# Patient Record
Sex: Female | Born: 1960 | Race: White | Hispanic: No | Marital: Married | Smoking: Former smoker
Health system: Southern US, Community
[De-identification: ages and names within clinical notes are randomized; demographics above are authoritative.]

## PROBLEM LIST (undated history)

## (undated) DIAGNOSIS — M8580 Other specified disorders of bone density and structure, unspecified site: Secondary | ICD-10-CM

## (undated) DIAGNOSIS — J45909 Unspecified asthma, uncomplicated: Secondary | ICD-10-CM

## (undated) DIAGNOSIS — U071 COVID-19: Secondary | ICD-10-CM

## (undated) DIAGNOSIS — R7302 Impaired glucose tolerance (oral): Secondary | ICD-10-CM

## (undated) DIAGNOSIS — K219 Gastro-esophageal reflux disease without esophagitis: Secondary | ICD-10-CM

## (undated) DIAGNOSIS — G4730 Sleep apnea, unspecified: Secondary | ICD-10-CM

## (undated) DIAGNOSIS — E669 Obesity, unspecified: Secondary | ICD-10-CM

## (undated) DIAGNOSIS — E785 Hyperlipidemia, unspecified: Secondary | ICD-10-CM

## (undated) DIAGNOSIS — J309 Allergic rhinitis, unspecified: Secondary | ICD-10-CM

## (undated) DIAGNOSIS — R42 Dizziness and giddiness: Secondary | ICD-10-CM

## (undated) DIAGNOSIS — E119 Type 2 diabetes mellitus without complications: Secondary | ICD-10-CM

## (undated) DIAGNOSIS — E559 Vitamin D deficiency, unspecified: Secondary | ICD-10-CM

## (undated) HISTORY — DX: COVID-19: U07.1

## (undated) HISTORY — DX: Other specified disorders of bone density and structure, unspecified site: M85.80

## (undated) HISTORY — DX: Hyperlipidemia, unspecified: E78.5

## (undated) HISTORY — DX: Obesity, unspecified: E66.9

## (undated) HISTORY — DX: Impaired glucose tolerance (oral): R73.02

## (undated) HISTORY — PX: BREAST CYST ASPIRATION: SHX578

## (undated) HISTORY — DX: Unspecified asthma, uncomplicated: J45.909

## (undated) HISTORY — DX: Allergic rhinitis, unspecified: J30.9

## (undated) HISTORY — DX: Gastro-esophageal reflux disease without esophagitis: K21.9

## (undated) HISTORY — DX: Vitamin D deficiency, unspecified: E55.9

## (undated) HISTORY — PX: GALLBLADDER SURGERY: SHX652

## (undated) HISTORY — PX: BREAST BIOPSY: SHX20

## (undated) HISTORY — PX: CHOLECYSTECTOMY: SHX55

---
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
## 1991-02-19 HISTORY — PX: BREAST EXCISIONAL BIOPSY: SUR124

## 1991-02-19 HISTORY — PX: BREAST BIOPSY: SHX20

## 1999-02-19 HISTORY — PX: ABDOMINAL HYSTERECTOMY: SHX81

## 2003-11-29 ENCOUNTER — Ambulatory Visit: Payer: Self-pay | Admitting: General Surgery

## 2003-12-09 ENCOUNTER — Ambulatory Visit: Payer: Self-pay | Admitting: General Surgery

## 2004-03-19 ENCOUNTER — Ambulatory Visit: Payer: Self-pay | Admitting: General Surgery

## 2004-08-03 ENCOUNTER — Ambulatory Visit: Payer: Self-pay | Admitting: General Surgery

## 2004-08-23 ENCOUNTER — Ambulatory Visit: Payer: Self-pay | Admitting: General Surgery

## 2005-04-16 ENCOUNTER — Ambulatory Visit: Payer: Self-pay | Admitting: General Surgery

## 2005-04-30 ENCOUNTER — Ambulatory Visit: Payer: Self-pay | Admitting: Internal Medicine

## 2005-05-06 ENCOUNTER — Ambulatory Visit: Payer: Self-pay | Admitting: Family Medicine

## 2005-05-09 ENCOUNTER — Ambulatory Visit: Payer: Self-pay | Admitting: Internal Medicine

## 2005-05-19 ENCOUNTER — Ambulatory Visit: Payer: Self-pay | Admitting: Internal Medicine

## 2006-08-25 ENCOUNTER — Ambulatory Visit: Payer: Self-pay | Admitting: Unknown Physician Specialty

## 2008-01-01 ENCOUNTER — Ambulatory Visit: Payer: Self-pay | Admitting: Unknown Physician Specialty

## 2009-03-03 ENCOUNTER — Ambulatory Visit: Payer: Self-pay | Admitting: Unknown Physician Specialty

## 2009-03-10 ENCOUNTER — Ambulatory Visit: Payer: Self-pay | Admitting: Unknown Physician Specialty

## 2011-09-24 ENCOUNTER — Ambulatory Visit: Payer: Self-pay

## 2011-09-26 ENCOUNTER — Ambulatory Visit: Payer: Self-pay

## 2012-03-24 ENCOUNTER — Ambulatory Visit: Payer: Self-pay

## 2013-12-13 ENCOUNTER — Ambulatory Visit: Payer: Self-pay

## 2014-05-26 ENCOUNTER — Institutional Professional Consult (permissible substitution): Payer: Self-pay | Admitting: Internal Medicine

## 2014-05-31 ENCOUNTER — Institutional Professional Consult (permissible substitution): Payer: Self-pay | Admitting: Internal Medicine

## 2014-06-09 ENCOUNTER — Institutional Professional Consult (permissible substitution): Payer: Self-pay | Admitting: Internal Medicine

## 2014-06-14 ENCOUNTER — Encounter (INDEPENDENT_AMBULATORY_CARE_PROVIDER_SITE_OTHER): Payer: Self-pay

## 2014-06-14 ENCOUNTER — Ambulatory Visit (INDEPENDENT_AMBULATORY_CARE_PROVIDER_SITE_OTHER): Payer: Self-pay | Admitting: Internal Medicine

## 2014-06-14 ENCOUNTER — Encounter: Payer: Self-pay | Admitting: Internal Medicine

## 2014-06-14 VITALS — BP 112/80 | HR 89 | Temp 98.0°F | Ht 66.5 in | Wt 306.0 lb

## 2014-06-14 DIAGNOSIS — J45909 Unspecified asthma, uncomplicated: Secondary | ICD-10-CM | POA: Insufficient documentation

## 2014-06-14 DIAGNOSIS — J4540 Moderate persistent asthma, uncomplicated: Secondary | ICD-10-CM

## 2014-06-14 NOTE — Patient Instructions (Addendum)
Follow up with Dr. Dema SeverinMungal in 2 months - pulmonary function testing and 6 minute walk test prior to follow up - continue with Advair 250/50 twice a day - gargle and rinse after each use - albuterol inhaler (rescue inhaler) - 2puff every 3-4 hours as needed for shortness of breath\wheezing\recurrent cough - continue with your current allergy regiment - continue with Allergist follow up for allergy drops.  - we will obtain your records from Dr. Meredeth IdeFleming office

## 2014-06-14 NOTE — Progress Notes (Signed)
Date: 06/14/2014  MRN# [id number] Kimberly Vasquez Mar 09, 1960  Referring Physician: Dr. Oneita Kras Kimberly Vasquez is a 54 y.o. old female seen in consultation for establish care of asthma  CC:  Chief Complaint  Patient presents with  . Advice Only    Referred for Asthma control. She saw Dr. Meredeth Ide once several years ago. Asthma has been managed by allergist Dr. Irving Shows and pcp.    HPI:  Patient is a pleasant 54 year old female with a past medical history of asthma, allergic rhinitis, hyperlipidemia, GERD and seen in consultation today for establish care for asthma. Off note, patient previously seen Dr. [name] a number of years ago for asthma management. Since then she has been following with her ENT/allergist physician for management of chronic sinusitis, allergic rhinitis, and allergy treatment. In the past when patient saw Dr. Meredeth Ide she was told she had asthmatic bronchitis, she was having recurrent bouts of wheezing/bronchitis, a PFT done at that time showed she had moderate obstruction she was placed on "inhalers" temporarily. Patient stated she had allergy skin testing done, which showed a number of positive outcomes. She recently saw her ENT physician who noted that she was having sinusitis and possibly triggering her asthma she was given a prednisone taper and started on Advair 250/50 (rotocaps); which significantly improved her symptoms and wheezing has resolved. Today patient endorses nasal congestion which is mostly clear and some throat clearing. Her allergies are worse in spring and fall. She has not had any recent E. emergency room or urgent care visits for breathing issues. Her current allergy regiment/asthma regiment include the following-Advair twice a day, Prilosec, Claritin, Singulair, Flonase, albuterol as needed. Patient states that she is probably used albuterol once in the past month. Patient stated she is currently on allergy drops as treatment for her chronic  allergies, however, these are on hold he'll further pulmonary evaluation.  PMHX:   Past Medical History  Diagnosis Date  . Asthma without status asthmaticus   . Allergic rhinitis   . Hyperlipidemia   . GERD (gastroesophageal reflux disease)   . Gestational diabetes   . IGT (impaired glucose tolerance)   . Osteopenia   . Obesity   . Vitamin D deficiency    Surgical Hx:  Past Surgical History  Procedure Laterality Date  . Abdominal hysterectomy  2001  . Gallbladder surgery    . Breast biopsy      x2 1979/1993   Family Hx:  Family History  Problem Relation Age of Onset  . Heart disease Mother   . Asthma Mother   . Diabetes Mother   . Stroke Father    Social Hx:   History  Substance Use Topics  . Smoking status: Former Smoker -- 0.50 packs/day for 8 years    Types: Cigarettes  . Smokeless tobacco: Never Used  . Alcohol Use: 0.0 oz/week    0 Standard drinks or equivalent per week     Comment: occasional glass of wine   Medication:   Current Outpatient Rx  Name  Route  Sig  Dispense  Refill  . albuterol (PROVENTIL HFA;VENTOLIN HFA) 108 (90 BASE) MCG/ACT inhaler   Inhalation   Inhale 2 puffs into the lungs every 6 (six) hours as needed.         . dimenhyDRINATE (DRAMAMINE) 50 MG tablet   Oral   Take 50 mg by mouth every 8 (eight) hours as needed.         . fluticasone (FLONASE) 50 MCG/ACT nasal spray  Each Nare   Place 2 sprays into both nostrils daily.         . Fluticasone-Salmeterol (ADVAIR) 250-50 MCG/DOSE AEPB   Inhalation   Inhale 1 puff into the lungs 2 (two) times daily.         . montelukast (SINGULAIR) 10 MG tablet   Oral   Take 10 mg by mouth at bedtime and may repeat dose one time if needed.         Marland Kitchen. omeprazole (PRILOSEC) 20 MG capsule   Oral   Take 20 mg by mouth daily.             Allergies:  Codeine  Review of Systems: Gen:  Denies  fever, sweats, chills HEENT: Denies blurred vision, double vision, ear pain, eye pain,  hearing loss, nose bleeds, sore throat Cvc:  No dizziness, chest pain or heaviness Resp:  Intermittent episodes of wheezing, shortness of breath, nasal congestion-attributed to allergies. Gi: Denies swallowing difficulty, stomach pain, nausea or vomiting, diarrhea, constipation, bowel incontinence Gu:  Denies bladder incontinence, burning urine Ext:   No Joint pain, stiffness or swelling Skin: No skin rash, easy bruising or bleeding or hives Endoc:  No polyuria, polydipsia , polyphagia or weight change Psych: No depression, insomnia or hallucinations  Other:  All other systems negative  Physical Examination:   VS: BP 112/80 mmHg  Pulse 89  Temp(Src) 98 F (36.7 C) (Oral)  Ht 5' 6.5" (1.689 m)  Wt 306 lb (138.801 kg)  BMI 48.66 kg/m2  SpO2 95%  General Appearance: No distress  Neuro:without focal findings, mental status, speech normal, alert and oriented, cranial nerves 2-12 intact, reflexes normal and symmetric, sensation grossly normal  HEENT: PERRLA, EOM intact, mild erythema of L ear canal, no ptosis, no other lesions noticed; Mallampati 2 Pulmonary: normal breath sounds., diaphragmatic excursion normal.No wheezing, No rales;   Sputum Production:  none CardiovascularNormal S1,S2.  No m/r/g.  Abdominal aorta pulsation normal.    Abdomen: Benign, Soft, non-tender, No masses, hepatosplenomegaly, No lymphadenopathy Renal:  No costovertebral tenderness  GU:  No performed at this time. Endoc: No evident thyromegaly, no signs of acromegaly or Cushing features Skin:   warm, no rashes, no ecchymosis  Extremities: normal, no cyanosis, clubbing, no edema, warm with normal capillary refill. Other findings:none    Assessment and Plan:54 year old female past medical history of asthma/GERD, establishing care for asthma.  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 She is currently  being followed by ENT/allergy, and is currently being managed with allergy drops. These drops are currently on hold secondary to further evaluation and optimization of her asthma. She has seen Dr. Mayo AoFlemming in the past for asthma was placed on short-term inhalers, which she cannot recall today. Given the level of her allergies and her persistent symptoms it is reasonable to continue her dual inhaler therapy ( ICS/LABA). Advair has given her moderate relief and control of her current symptoms of wheezing and shortness of breath. In time with control of her allergies, dual asthma therapy can probably be weaned down to monotherapy. The standard treatment and control of asthma is greatly maintained for an inhale corticosteroid. At this time given moderate control of her symptoms with Advair and seldom uses her rescue inhaler it is reasonable to restart/control her allergic symptoms with her allergy drops.   Plan: - pulmonary function testing and 6 minute walk test prior to follow up - continue with  Advair 250/50 twice a day - gargle and rinse after each use - albuterol inhaler (rescue inhaler) - 2puff every 3-4 hours as needed for shortness of breath\wheezing\recurrent cough - continue with your current allergy regiment - continue with Allergist follow up for allergy drops.  - obtain records from Dr. Meredeth Ide office     Updated Medication List Outpatient Encounter Prescriptions as of 06/14/2014  Medication Sig  . albuterol (PROVENTIL HFA;VENTOLIN HFA) 108 (90 BASE) MCG/ACT inhaler Inhale 2 puffs into the lungs every 6 (six) hours as needed.  . dimenhyDRINATE (DRAMAMINE) 50 MG tablet Take 50 mg by mouth every 8 (eight) hours as needed.  . fluticasone (FLONASE) 50 MCG/ACT nasal spray Place 2 sprays into both nostrils daily.  . Fluticasone-Salmeterol (ADVAIR) 250-50 MCG/DOSE AEPB Inhale 1 puff into the lungs 2 (two) times daily.  . montelukast (SINGULAIR) 10 MG tablet Take 10 mg by mouth at bedtime  and may repeat dose one time if needed.  Marland Kitchen omeprazole (PRILOSEC) 20 MG capsule Take 20 mg by mouth daily.    Orders for this visit: No orders of the defined types were placed in this encounter.     Thank  you for the consultation and for allowing [facility] to assist in the care of your patient. Our recommendations are noted above.  Please contact us if we can be of further service.   Stephanie Acre, MD [facility] Office Number: [phone]

## 2014-06-15 NOTE — Assessment & Plan Note (Signed)
I believe the patient has a component of extrinsic asthma, secondary to recurrent environmental allergens environmental weather changes. She is currently being followed by ENT/allergy, and is currently being managed with allergy drops. These drops are currently on hold secondary to further evaluation and optimization of her asthma. She has seen Dr. Mayo AoFlemming in the past for asthma was placed on short-term inhalers, which she cannot recall today. Given the level of her allergies and her persistent symptoms it is reasonable to continue her dual inhaler therapy ( ICS/LABA). Advair has given her moderate relief and control of her current symptoms of wheezing and shortness of breath. In time with control of her allergies, dual asthma therapy can probably be weaned down to monotherapy. The standard treatment and control of asthma is greatly maintained for an inhale corticosteroid. At this time given moderate control of her symptoms with Advair and seldom uses her rescue inhaler it is reasonable to restart/control her allergic symptoms with her allergy drops.   Plan: - pulmonary function testing and 6 minute walk test prior to follow up - continue with Advair 250/50 twice a day - gargle and rinse after each use - albuterol inhaler (rescue inhaler) - 2puff every 3-4 hours as needed for shortness of breath\wheezing\recurrent cough - continue with your current allergy regiment - continue with Allergist follow up for allergy drops.  - obtain records from Dr. Meredeth IdeFleming office

## 2014-08-15 ENCOUNTER — Ambulatory Visit (INDEPENDENT_AMBULATORY_CARE_PROVIDER_SITE_OTHER): Payer: Self-pay | Admitting: Internal Medicine

## 2014-08-15 ENCOUNTER — Encounter: Payer: Self-pay | Admitting: Internal Medicine

## 2014-08-15 ENCOUNTER — Telehealth: Payer: Self-pay | Admitting: *Deleted

## 2014-08-15 VITALS — BP 134/80 | HR 82 | Ht 67.5 in | Wt 311.0 lb

## 2014-08-15 DIAGNOSIS — J45909 Unspecified asthma, uncomplicated: Secondary | ICD-10-CM

## 2014-08-15 DIAGNOSIS — J4540 Moderate persistent asthma, uncomplicated: Secondary | ICD-10-CM

## 2014-08-15 LAB — PULMONARY FUNCTION TEST
DL/VA % pred: 108 %
DL/VA: 5.65 ml/min/mmHg/L
DLCO UNC % PRED: 115 %
DLCO unc: 33.37 ml/min/mmHg
FEF 25-75 Post: 2 L/sec
FEF 25-75 Pre: 2.2 L/sec
FEF2575-%Change-Post: -9 %
FEF2575-%Pred-Post: 72 %
FEF2575-%Pred-Pre: 80 %
FEV1-%CHANGE-POST: 2 %
FEV1-%PRED-POST: 73 %
FEV1-%PRED-PRE: 72 %
FEV1-POST: 2.22 L
FEV1-Pre: 2.17 L
FEV1FVC-%Change-Post: 0 %
FEV1FVC-%PRED-PRE: 97 %
FEV6-%Change-Post: 3 %
FEV6-%Pred-Post: 74 %
FEV6-%Pred-Pre: 71 %
FEV6-Post: 2.77 L
FEV6-Pre: 2.67 L
FEV6FVC-%PRED-POST: 103 %
FEV6FVC-%Pred-Pre: 103 %
FVC-%Change-Post: 1 %
FVC-%Pred-Post: 74 %
FVC-%Pred-Pre: 73 %
FVC-Post: 2.87 L
FVC-Pre: 2.82 L
POST FEV1/FVC RATIO: 78 %
POST FEV6/FVC RATIO: 100 %
PRE FEV6/FVC RATIO: 100 %
Pre FEV1/FVC ratio: 77 %

## 2014-08-15 MED ORDER — FLUTICASONE-SALMETEROL 500-50 MCG/DOSE IN AEPB: 1.0000 | INHALATION_SPRAY | Freq: Two times a day (BID) | RESPIRATORY_TRACT | Status: DC

## 2014-08-15 NOTE — Progress Notes (Signed)
SMW performed today. 

## 2014-08-15 NOTE — Patient Instructions (Signed)
Follow up with Dr. Dema Severin in 3 months - continue with allergy drops and allergy regiment - you have moderate obstruction with symptoms on your pulmonary function test, will increase your Advair to 500/50 - 1 puff in the AM and 1 puff in the PM - gargle and rinse after each use - there is also moderate to severe restriction on your pfts, which could be weight related - we discussed diet, exercise, wt loss today, at follow up if symptoms have not improved with step up asthma therapy and current allergy regiment, we will consider a CT Chest at that visit.

## 2014-08-15 NOTE — Assessment & Plan Note (Addendum)
I believe the patient has a component of extrinsic asthma, secondary to recurrent environmental allergens environmental weather changes. She is currently being followed by ENT/allergy, and is currently being managed with allergy drops.  Given the level of her allergies and her persistent symptoms it is reasonable to continue her dual inhaler therapy ( ICS/LABA). Advair has given her moderate relief and control of her current symptoms of wheezing and shortness of breath. In time with control of her allergies, dual asthma therapy can probably be weaned down to monotherapy. The standard treatment and control of asthma is greatly maintained for an inhale corticosteroid. At this time given the level of obstruction along with clinical symptoms of cough, and continued shortness of breath, will increase her Advair to 500/50, 1 puff 2 times a day, daily.  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 -Advair 500/50, 1 puff twice a day, gargle and rinse after each use. - albuterol inhaler (rescue inhaler) - 2puff every 3-4 hours as needed for shortness of breath\wheezing\recurrent cough - continue with your current allergy regiment - continue with Allergist follow up for allergy drops.  -She noted to have moderate to severe restriction on her pulmonary function testing, this could be due to abdominal/chest wall obesity, we discussed weight loss/diet forward exercise at today's visit. If patient continues to be symptomatically follow-up visit given the level of restriction that she has a primary function testing may need further workup for interstitial lung disease via high-resolution CAT scan. This will be discussed with her at follow-up visit

## 2014-08-15 NOTE — Progress Notes (Signed)
PFT performed today. 

## 2014-08-15 NOTE — Progress Notes (Signed)
MRN# [id number] Kimberly Vasquez 1960/07/08   CC: Chief Complaint  Patient presents with  . Follow-up    SMW/PFT results; SOB w/activity;       Brief History: HPI 06-14-14 Patient is a pleasant 54 year old female with a past medical history of asthma, allergic rhinitis, hyperlipidemia, GERD and seen in consultation today for establish care for asthma. Off note, patient previously seen Dr. [name] a number of years ago for asthma management. Since then she has been following with her ENT/allergist physician for management of chronic sinusitis, allergic rhinitis, and allergy treatment. In the past when patient saw Dr. Meredeth Ide she was told she had asthmatic bronchitis, she was having recurrent bouts of wheezing/bronchitis, a PFT done at that time showed she had moderate obstruction she was placed on "inhalers" temporarily. Patient stated she had allergy skin testing done, which showed a number of positive outcomes. She recently saw her ENT physician who noted that she was having sinusitis and possibly triggering her asthma she was given a prednisone taper and started on Advair 250/50 (rotocaps); which significantly improved her symptoms and wheezing has resolved. Today patient endorses nasal congestion which is mostly clear and some throat clearing. Her allergies are worse in spring and fall. She has not had any recent E. emergency room or urgent care visits for breathing issues. Her current allergy regiment/asthma regiment include the following-Advair twice a day, Prilosec, Claritin, Singulair, Flonase, albuterol as needed. Patient states that she is probably used albuterol once in the past month. Patient stated she is currently on allergy drops as treatment for her chronic allergies, however, these are on hold he'll further pulmonary evaluation.  Plan: - pulmonary function testing and 6 minute walk test prior to follow up - continue with Advair 250/50 twice a day - gargle and rinse after  each use - albuterol inhaler (rescue inhaler) - 2puff every 3-4 hours as needed for shortness of breath\wheezing\recurrent cough - continue with your current allergy regiment - continue with Allergist follow up for allergy drops.  - obtain records from Dr. Meredeth Ide office  Events since last clinic visit: Patient states that overall she is doing well. Currently getting allergy drops, which is helping with her symptoms of cough, still with head and nasal congestion.  Today had pfts and done.       Medication:   Current Outpatient Rx  Name  Route  Sig  Dispense  Refill  . albuterol (PROVENTIL HFA;VENTOLIN HFA) 108 (90 BASE) MCG/ACT inhaler   Inhalation   Inhale 2 puffs into the lungs every 6 (six) hours as needed.         . dimenhyDRINATE (DRAMAMINE) 50 MG tablet   Oral   Take 50 mg by mouth every 8 (eight) hours as needed.         . fluticasone (FLONASE) 50 MCG/ACT nasal spray   Each Nare   Place 2 sprays into both nostrils daily.         . Fluticasone-Salmeterol (ADVAIR) 250-50 MCG/DOSE AEPB   Inhalation   Inhale 1 puff into the lungs 2 (two) times daily.         Marland Kitchen loratadine (CLARITIN) 10 MG tablet   Oral   Take 10 mg by mouth daily.         . montelukast (SINGULAIR) 10 MG tablet   Oral   Take 10 mg by mouth at bedtime and may repeat dose one time if needed.         Marland Kitchen omeprazole (PRILOSEC) 20  MG capsule   Oral   Take 20 mg by mouth daily.            Review of Systems: Gen:  Denies  fever, sweats, chills HEENT: Denies blurred vision, double vision, ear pain, eye pain, hearing loss, nose bleeds, sore throat Cvc:  No dizziness, chest pain or heaviness Resp:   No cough, only "throat clearing" Gi: Denies swallowing difficulty, stomach pain, nausea or vomiting, diarrhea, constipation, bowel incontinence Gu:  Denies bladder incontinence, burning urine Ext:   No Joint pain, stiffness or swelling Skin: No skin rash, easy bruising or bleeding or  hives Endoc:  No polyuria, polydipsia , polyphagia or weight change Other:  All other systems negative  Allergies:  Codeine  Physical Examination:  VS: BP 134/80 mmHg  Pulse 82  Ht 5' 7.5" (1.715 m)  Wt 311 lb (141.069 kg)  BMI 47.96 kg/m2  SpO2 93%  General Appearance: No distress  HEENT: PERRLA, no ptosis, no other lesions noticed Pulmonary:normal breath sounds., diaphragmatic excursion normal.No wheezing, No rales   Cardiovascular:  Normal S1,S2.  No m/r/g.     Abdomen:Exam: Benign, Soft, non-tender, No masses  Skin:   warm, no rashes, no ecchymosis  Extremities: normal, no cyanosis, clubbing, warm with normal capillary refill.    Pulmonary function testing 08/15/2014 FVC 73% FEV1 72% FEV1/FVC 77% FRC 41% RV 51% TLC 65% RV/TLC 76% Impression: Moderate obstruction with no stomach and broncho-dilator response. Severe to moderate decrease in RV and TLC's, suggesting a restrictive component, severe reduction in ERV, these findings together can be seen in abdominal/chest wall obesity. Clinical correlation is considered/advised. 6 minute walk test: 1131 feet/345 m, low saturation 93%, highest heart rate 107.   Assessment and Plan: 54 year old female past medical history of asthma and follow-up asthma visit today. Morbid obesity OBESITY  Wt: 311 lbs  Discussed importance of weight reduction.  Educated regarding limitation of  intake of greasy/fried foods.  Instructed on benefit of  a low-impact exercise program, starting slowly.  Discussed benefits of 30-45 minutes of some form of exercise daily as well as benefit of supervised exercise program.     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 She is currently being followed by ENT/allergy, and is currently being managed with allergy drops.  Given the level of her allergies and her persistent symptoms it is  reasonable to continue her dual inhaler therapy ( ICS/LABA). Advair has given her moderate relief and control of her current symptoms of wheezing and shortness of breath. In time with control of her allergies, dual asthma therapy can probably be weaned down to monotherapy. The standard treatment and control of asthma is greatly maintained for an inhale corticosteroid. At this time given the level of obstruction along with clinical symptoms of cough, and continued shortness of breath, will increase her Advair to 500/50, 1 puff 2 times a day, daily.  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 -Advair 500/50, 1 puff twice a day, gargle and rinse after each use. - albuterol inhaler (rescue inhaler) - 2puff every 3-4 hours as needed for shortness of breath\wheezing\recurrent cough - continue with your current allergy regiment - continue with Allergist follow up for allergy drops.  -She noted to have moderate to severe restriction on her pulmonary function testing, this could be due to abdominal/chest wall obesity, we discussed weight loss/diet forward exercise at today's visit. If patient continues to be symptomatically follow-up visit given  the level of restriction that she has a primary function testing may need further workup for interstitial lung disease via high-resolution CAT scan. This will be discussed with her at follow-up visit     Updated Medication List Outpatient Encounter Prescriptions as of 08/15/2014  Medication Sig  . albuterol (PROVENTIL HFA;VENTOLIN HFA) 108 (90 BASE) MCG/ACT inhaler Inhale 2 puffs into the lungs every 6 (six) hours as needed.  . dimenhyDRINATE (DRAMAMINE) 50 MG tablet Take 50 mg by mouth every 8 (eight) hours as needed.  . fluticasone (FLONASE) 50 MCG/ACT nasal spray Place 2 sprays into both nostrils daily.  . Fluticasone-Salmeterol (ADVAIR) 250-50 MCG/DOSE AEPB Inhale 1 puff into the lungs 2 (two) times daily.  Marland Kitchen loratadine (CLARITIN) 10 MG tablet Take 10  mg by mouth daily.  . montelukast (SINGULAIR) 10 MG tablet Take 10 mg by mouth at bedtime and may repeat dose one time if needed.  Marland Kitchen omeprazole (PRILOSEC) 20 MG capsule Take 20 mg by mouth daily.   No facility-administered encounter medications on file as of 08/15/2014.    Orders for this visit: No orders of the defined types were placed in this encounter.    Thank  you for the visitation and for allowing  [facility] to assist in the care of your patient. Our recommendations are noted above.  Please contact us if we can be of further service.  Stephanie Acre, MD [facility] Office Number: [phone]

## 2014-08-15 NOTE — Assessment & Plan Note (Signed)
OBESITY  Wt: 311 lbs  Discussed importance of weight reduction.  Educated regarding limitation of  intake of greasy/fried foods.  Instructed on benefit of  a low-impact exercise program, starting slowly.  Discussed benefits of 30-45 minutes of some form of exercise daily as well as benefit of supervised exercise program.

## 2014-08-15 NOTE — Telephone Encounter (Signed)
PFT order placed

## 2014-12-08 ENCOUNTER — Ambulatory Visit (INDEPENDENT_AMBULATORY_CARE_PROVIDER_SITE_OTHER): Payer: Self-pay | Admitting: Internal Medicine

## 2014-12-08 ENCOUNTER — Encounter (INDEPENDENT_AMBULATORY_CARE_PROVIDER_SITE_OTHER): Payer: Self-pay

## 2014-12-08 ENCOUNTER — Encounter: Payer: Self-pay | Admitting: Internal Medicine

## 2014-12-08 VITALS — BP 126/78 | HR 80 | Ht 67.5 in | Wt 310.0 lb

## 2014-12-08 DIAGNOSIS — J4540 Moderate persistent asthma, uncomplicated: Secondary | ICD-10-CM

## 2014-12-08 DIAGNOSIS — Z23 Encounter for immunization: Secondary | ICD-10-CM

## 2014-12-08 MED ORDER — FLUTICASONE FUROATE-VILANTEROL 200-25 MCG/INH IN AEPB: 1.0000 | INHALATION_SPRAY | Freq: Every day | RESPIRATORY_TRACT | Status: AC

## 2014-12-08 MED ORDER — FLUTICASONE FUROATE-VILANTEROL 200-25 MCG/INH IN AEPB: 1.0000 | INHALATION_SPRAY | Freq: Every day | RESPIRATORY_TRACT | Status: DC

## 2014-12-08 NOTE — Patient Instructions (Signed)
Follow up with Dr. Dema SeverinMungal in: 6 months - we will stop advair - start Breo (200/25) - 1 puff daily, gargle and rinse after each use - cont with diet, exercise and wt. Loss - cont with allergy meds.

## 2014-12-08 NOTE — Addendum Note (Signed)
Addended by: Meyer CoryAHMAD, MISTY R on: 12/08/2014 12:06 PM   Modules accepted: Orders

## 2014-12-08 NOTE — Assessment & Plan Note (Signed)
I believe the patient has a component of extrinsic asthma, secondary to recurrent environmental allergens environmental weather changes. She is currently being followed by ENT/allergy, and is currently being managed with allergy drops.  Given the level of her allergies and her persistent symptoms it is reasonable to continue her dual inhaler therapy ( ICS/LABA). Advair has given her moderate relief and control of her current symptoms of wheezing and shortness of breath, but is now causing hoarse voice. In time with control of her allergies, dual asthma therapy can probably be weaned down to monotherapy. The standard treatment and control of asthma is greatly maintained for an inhale corticosteroid.  At this time patient has come into some financial issues and insurance is become an issue, we'll stop Advair, and start Breo with patient assistance.   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 -BreoEllipta 200/25, 1 puff daily, gargle and rinse after each use. - albuterol inhaler (rescue inhaler) - 2puff every 3-4 hours as needed for shortness of breath\wheezing\recurrent cough - continue with your current allergy regiment - continue with Allergist follow up for allergy drops.  -She noted to have moderate to severe restriction on her pulmonary function testing, this could be due to abdominal/chest wall obesity, we discussed weight loss/diet forward exercise at today's visit. If patient continues to be symptomatically follow-up visit given the level of restriction that she has a primary function testing may need further workup for interstitial lung disease via high-resolution CAT scan. This will be discussed with her at follow-up visit

## 2014-12-08 NOTE — Progress Notes (Signed)
[facility]      MRN# [id number] Kimberly PatterRobin Machial Nugent 1960-03-13   CC: Chief Complaint  Patient presents with  . Follow-up    doing the same; allergies are bothering her more. SOB w/activity at times; prod cough w/clear mucus;      Brief History: HPI 06-14-14 Patient is a pleasant 54 year old female with a past medical history of asthma, allergic rhinitis, hyperlipidemia, GERD and seen in consultation today for establish care for asthma. Off note, patient previously seen Dr. Mayo AoFlemming a number of years ago for asthma management. Since then she has been following with her ENT/allergist physician for management of chronic sinusitis, allergic rhinitis, and allergy treatment. In the past when patient saw Dr. Meredeth IdeFleming she was told she had asthmatic bronchitis, she was having recurrent bouts of wheezing/bronchitis, a PFT done at that time showed she had moderate obstruction she was placed on "inhalers" temporarily. Patient stated she had allergy skin testing done, which showed a number of positive outcomes. She recently saw her ENT physician who noted that she was having sinusitis and possibly triggering her asthma she was given a prednisone taper and started on Advair 250/50 (rotocaps); which significantly improved her symptoms and wheezing has resolved. Today patient endorses nasal congestion which is mostly clear and some throat clearing. Her allergies are worse in spring and fall. She has not had any recent E. emergency room or urgent care visits for breathing issues. Her current allergy regiment/asthma regiment include the following-Advair twice a day, Prilosec, Claritin, Singulair, Flonase, albuterol as needed. Patient states that she is probably used albuterol once in the past month. Patient stated she is currently on allergy drops as treatment for her chronic allergies, however, these are on hold he'll further pulmonary evaluation.  Plan: - pulmonary  function testing and 6 minute walk test prior to follow up - continue with Advair 250/50 twice a day - gargle and rinse after each use - albuterol inhaler (rescue inhaler) - 2puff every 3-4 hours as needed for shortness of breath\wheezing\recurrent cough - continue with your current allergy regiment - continue with Allergist follow up for allergy drops.  - obtain records from Dr. Meredeth IdeFleming office  ROV 07/2014 Patient states that overall she is doing well. Currently getting allergy drops, which is helping with her symptoms of cough, still with head and nasal congestion.  Today had pfts and 6mwt done.   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 -Advair 500/50, 1 puff twice a day, gargle and rinse after each use. - albuterol inhaler (rescue inhaler) - 2puff every 3-4 hours as needed for shortness of breath\wheezing\recurrent cough - continue with your current allergy regiment - continue with Allergist follow up for allergy drops.  -She noted to have moderate to severe restriction on her pulmonary function testing, this could be due to abdominal/chest wall obesity, we discussed weight loss/diet forward exercise at today's visit. If patient continues to be symptomatically follow-up visit given the level of restriction that she has a primary function testing may need further workup for interstitial lung disease via high-resolution CAT scan. This will be discussed with her at follow-up visit  Events since last clinic visit:  Patient presents today for follow-up visit of asthma. She states since her last visit she has lost her insurance, however she is still taking Advair, which is causing hoarse voice. Patient states she is still taking allergy drops nightly, this is her third month of allergy drops. States today that she has nasal congestion/drainage, mild cough with  thick productive sputum, no significant shortness of breath, no stomach and weight loss. Patient states in the fall her  allergies act up the most. Overall she does not have a decline in her respiratory status.    Medication:   Current Outpatient Rx  Name  Route  Sig  Dispense  Refill  . albuterol (PROVENTIL HFA;VENTOLIN HFA) 108 (90 BASE) MCG/ACT inhaler   Inhalation   Inhale 2 puffs into the lungs every 6 (six) hours as needed.         . dimenhyDRINATE (DRAMAMINE) 50 MG tablet   Oral   Take 50 mg by mouth every 8 (eight) hours as needed.         . fluticasone (FLONASE) 50 MCG/ACT nasal spray   Each Nare   Place 2 sprays into both nostrils daily.         . Fluticasone-Salmeterol (ADVAIR DISKUS) 500-50 MCG/DOSE AEPB   Inhalation   Inhale 1 puff into the lungs 2 (two) times daily. Rinse and gargle after each use.   60 each   6   . Fluticasone-Salmeterol (ADVAIR) 250-50 MCG/DOSE AEPB   Inhalation   Inhale 1 puff into the lungs 2 (two) times daily.         Marland Kitchen loratadine (CLARITIN) 10 MG tablet   Oral   Take 10 mg by mouth daily.         Marland Kitchen omeprazole (PRILOSEC) 20 MG capsule   Oral   Take 20 mg by mouth daily.            Review of Systems  Constitutional: Negative for fever, chills, weight loss and malaise/fatigue.  HENT: Positive for congestion and sore throat.   Eyes: Negative for double vision.  Respiratory: Positive for cough and sputum production. Negative for hemoptysis and wheezing.   Cardiovascular: Negative for chest pain.  Skin: Negative for itching and rash.  Neurological: Negative for headaches.      Allergies:  Codeine  Physical Examination:  VS: BP 126/78 mmHg  Pulse 80  Ht 5' 7.5" (1.715 m)  Wt 310 lb (140.615 kg)  BMI 47.81 kg/m2  SpO2 97%  General Appearance: No distress  HEENT: PERRLA, no ptosis, no other lesions noticed Pulmonary:normal breath sounds., diaphragmatic excursion normal.No wheezing, No rales   Cardiovascular:  Normal S1,S2.  No m/r/g.     Abdomen:Exam: Benign, Soft, non-tender, No masses  Skin:   warm, no rashes, no ecchymosis    Extremities: normal, no cyanosis, clubbing, warm with normal capillary refill.      Rad results: (The following images and results were reviewed by Dr. Dema Severin).     Assessment and Plan: No problem-specific assessment & plan notes found for this encounter.   Updated Medication List Outpatient Encounter Prescriptions as of 12/08/2014  Medication Sig  . albuterol (PROVENTIL HFA;VENTOLIN HFA) 108 (90 BASE) MCG/ACT inhaler Inhale 2 puffs into the lungs every 6 (six) hours as needed.  . dimenhyDRINATE (DRAMAMINE) 50 MG tablet Take 50 mg by mouth every 8 (eight) hours as needed.  . fluticasone (FLONASE) 50 MCG/ACT nasal spray Place 2 sprays into both nostrils daily.  . Fluticasone-Salmeterol (ADVAIR DISKUS) 500-50 MCG/DOSE AEPB Inhale 1 puff into the lungs 2 (two) times daily. Rinse and gargle after each use.  Marland Kitchen Fluticasone-Salmeterol (ADVAIR) 250-50 MCG/DOSE AEPB Inhale 1 puff into the lungs 2 (two) times daily.  Marland Kitchen loratadine (CLARITIN) 10 MG tablet Take 10 mg by mouth daily.  Marland Kitchen omeprazole (PRILOSEC) 20 MG capsule Take 20 mg by mouth  daily.  . [DISCONTINUED] montelukast (SINGULAIR) 10 MG tablet Take 10 mg by mouth at bedtime and may repeat dose one time if needed.   No facility-administered encounter medications on file as of 12/08/2014.    Orders for this visit: No orders of the defined types were placed in this encounter.    Thank  you for the visitation and for allowing  [facility] to assist in the care of your patient. Our recommendations are noted above.  Please contact us if we can be of further service.  Stephanie Acre, MD  Pulmonary and Critical Care Office Number: [phone]

## 2015-01-23 ENCOUNTER — Encounter: Payer: Self-pay | Admitting: Internal Medicine

## 2015-01-23 ENCOUNTER — Ambulatory Visit (INDEPENDENT_AMBULATORY_CARE_PROVIDER_SITE_OTHER): Payer: Self-pay | Admitting: Internal Medicine

## 2015-01-23 ENCOUNTER — Telehealth: Payer: Self-pay

## 2015-01-23 ENCOUNTER — Telehealth: Payer: Self-pay | Admitting: Internal Medicine

## 2015-01-23 VITALS — BP 122/70 | HR 110 | Ht 67.5 in | Wt 305.0 lb

## 2015-01-23 DIAGNOSIS — J45901 Unspecified asthma with (acute) exacerbation: Secondary | ICD-10-CM

## 2015-01-23 MED ORDER — PREDNISONE 20 MG PO TABS: 40.0000 mg | ORAL_TABLET | Freq: Every day | ORAL | Status: DC

## 2015-01-23 MED ORDER — CLARITHROMYCIN ER 500 MG PO TB24: 500.0000 mg | ORAL_TABLET | Freq: Every day | ORAL | Status: DC

## 2015-01-23 NOTE — Telephone Encounter (Signed)
biaxin 500 mg BID for 10 days

## 2015-01-23 NOTE — Patient Instructions (Signed)
Asthma Attack Prevention While you may not be able to control the fact that you have asthma, you can take actions to prevent asthma attacks. The best way to prevent asthma attacks is to maintain good control of your asthma. You can achieve this by:  Taking your medicines as directed.  Avoiding things that can irritate your airways or make your asthma symptoms worse (asthma triggers).  Keeping track of how well your asthma is controlled and of any changes in your symptoms.  Responding quickly to worsening asthma symptoms (asthma attack).  Seeking emergency care when it is needed. WHAT ARE SOME WAYS TO PREVENT AN ASTHMA ATTACK? Have a Plan Work with your health care provider to create a written plan for managing and treating your asthma attacks (asthma action plan). This plan includes:  A list of your asthma triggers and how you can avoid them.  Information on when medicines should be taken and when their dosages should be changed.  The use of a device that measures how well your lungs are working (peak flow meter). Monitor Your Asthma Use your peak flow meter and record your results in a journal every day. A drop in your peak flow numbers on one or more days may indicate the start of an asthma attack. This can happen even before you start to feel symptoms. You can prevent an asthma attack from getting worse by following the steps in your asthma action plan. Avoid Asthma Triggers Work with your asthma health care provider to find out what your asthma triggers are. This can be done by:  Allergy testing.  Keeping a journal that notes when asthma attacks occur and the factors that may have contributed to them.  Determining if there are other medical conditions that are making your asthma worse. Once you have determined your asthma triggers, take steps to avoid them. This may include avoiding excessive or prolonged exposure to:  Dust. Have someone dust and vacuum your home for you once or  twice a week. Using a high-efficiency particulate arrestance (HEPA) vacuum is best.  Smoke. This includes campfire smoke, forest fire smoke, and secondhand smoke from tobacco products.  Pet dander. Avoid contact with animals that you know you are allergic to.  Allergens from trees, grasses or pollens. Avoid spending a lot of time outdoors when pollen counts are high, and on very windy days.  Very cold, dry, or humid air.  Mold.  Foods that contain high amounts of sulfites.  Strong odors.  Outdoor air pollutants, such as engine exhaust.  Indoor air pollutants, such as aerosol sprays and fumes from household cleaners.  Household pests, including dust mites and cockroaches, and pest droppings.  Certain medicines, including NSAIDs. Always talk to your health care provider before stopping or starting any new medicines. Medicines Take over-the-counter and prescription medicines only as told by your health care provider. Many asthma attacks can be prevented by carefully following your medicine schedule. Taking your medicines correctly is especially important when you cannot avoid certain asthma triggers. Act Quickly If an asthma attack does happen, acting quickly can decrease how severe it is and how long it lasts. Take these steps:   Pay attention to your symptoms. If you are coughing, wheezing, or having difficulty breathing, do not wait to see if your symptoms go away on their own. Follow your asthma action plan.  If you have followed your asthma action plan and your symptoms are not improving, call your health care provider or seek immediate medical care   at the nearest hospital. It is important to note how often you need to use your fast-acting rescue inhaler. If you are using your rescue inhaler more often, it may mean that your asthma is not under control. Adjusting your asthma treatment plan may help you to prevent future asthma attacks and help you to gain better control of your  condition. HOW CAN I PREVENT AN ASTHMA ATTACK WHEN I EXERCISE? Follow advice from your health care provider about whether you should use your fast-acting inhaler before exercising. Many people with asthma experience exercise-induced bronchoconstriction (EIB). This condition often worsens during vigorous exercise in cold, humid, or dry environments. Usually, people with EIB can stay very active by pre-treating with a fast-acting inhaler before exercising.   This information is not intended to replace advice given to you by your health care provider. Make sure you discuss any questions you have with your health care provider.   Document Released: 01/23/2009 Document Revised: 10/26/2014 Document Reviewed: 07/07/2014 Elsevier Interactive Patient Education 2016 [employer]  

## 2015-01-23 NOTE — Telephone Encounter (Signed)
Pt is at pharmacy waiting on this rx

## 2015-01-23 NOTE — Telephone Encounter (Signed)
Pt had called stating that her biaxin from today's appt is incorrect at #[facility] [place] [place] [facility] and was supposed to bid twice daily x10days  Per today's visit with Dr Belia HemanKasa: Assessment and Plan:  54 yo white female with acute mild ASTHMA exacerbation  1.start biaxin abx as this medication has helped the most in the past 2.start Prendisone 40 mg daily for 7 days 3.albuterol as needed, continue Breo  Follow up with Dr. Dema SeverinMungal in 3 months Patient advised to go to call us or go ER if symptoms are worse  Unsure how long Dr. Belia HemanKasa wants pt to take the Biaxin > ? x10days or BID x5 days to equal #10 [facility] x5 times with a response saying "the person you are trying to reach is unavailable.  Please call back or leave a message." ATC pt x2 to inform her of the reason for delay but this is wrong number  Dr. Belia HemanKasa please advise, thank you.

## 2015-01-23 NOTE — Telephone Encounter (Signed)
Called pharmacy and gave new rx. LMOM to inform pt new rx was given. Nothing further needed.

## 2015-01-23 NOTE — Telephone Encounter (Signed)
Has question regarding Biaxin. Please call.

## 2015-01-23 NOTE — Progress Notes (Signed)
  [facility]      MRN# [id number] Adair PatterRobin Machial Suh 22-Jul-1960   CC: Chief Complaint  Patient presents with  . Acute Visit    pt. states she feels she's wheezing. SOB. prod. cough yellowish in color. chest pain/tightness x3d.       HPI  Patient with acute wheezing, cough Started last week has SOB Taking albuterol inh daily for last several days Has sinus pressure and nasal drainage    Review of Systems  Constitutional: Positive for malaise/fatigue. Negative for fever, chills and weight loss.  HENT: Positive for congestion and sore throat.   Respiratory: Positive for cough, sputum production and wheezing. Negative for hemoptysis and stridor.   Cardiovascular: Negative for chest pain.      Allergies:  Codeine  Physical Examination:  VS: BP 122/70 mmHg  Pulse 110  Ht 5' 7.5" (1.715 m)  Wt 305 lb (138.347 kg)  BMI 47.04 kg/m2  SpO2 94%  General Appearance: No distress  HEENT: PERRLA, no ptosis, no other lesions noticed +sinus pressure maxillary sinuses Pulmonary:normal breath sounds., diaphragmatic excursion normal.No wheezing, No rales   Cardiovascular:  Normal S1,S2.  No m/r/g.        Assessment and Plan:  54 yo white female with acute mild ASTHMA exacerbation  1.start biaxin abx as this medication has helped the most in the past 2.start Prendisone 40 mg daily for 7 days 3.albuterol as needed, continue Breo  Follow up with Dr. Dema SeverinMungal in 3 months Patient advised to go to call us or go ER if symptoms are worse  The Patient requires high complexity decision making for assessment and support, frequent evaluation and titration of therapies. Patient satisfied with Plan of action and management. All questions answered  Lucie LeatherKurian David Nyanna Heideman, M.D.  [facility]  Medical Director [facility] Medical Director [facility]

## 2015-04-19 ENCOUNTER — Encounter: Payer: Self-pay | Admitting: Internal Medicine

## 2015-04-19 ENCOUNTER — Ambulatory Visit (INDEPENDENT_AMBULATORY_CARE_PROVIDER_SITE_OTHER): Payer: Self-pay | Admitting: Internal Medicine

## 2015-04-19 VITALS — BP 122/74 | HR 114 | Temp 99.3°F | Ht 67.5 in | Wt 304.8 lb

## 2015-04-19 DIAGNOSIS — J4540 Moderate persistent asthma, uncomplicated: Secondary | ICD-10-CM

## 2015-04-19 DIAGNOSIS — J069 Acute upper respiratory infection, unspecified: Secondary | ICD-10-CM | POA: Insufficient documentation

## 2015-04-19 MED ORDER — LEVOFLOXACIN 500 MG PO TABS: 500.0000 mg | ORAL_TABLET | Freq: Every day | ORAL | Status: DC

## 2015-04-19 MED ORDER — PREDNISONE 20 MG PO TABS: 20.0000 mg | ORAL_TABLET | Freq: Every day | ORAL | Status: DC

## 2015-04-19 NOTE — Patient Instructions (Signed)
Follow up with Dr. Dema Severin in:3 months -Levaquin 500 mg, 1 tab daily 7 days -Prednisone 20 mg, 1 tab 5 days, take with breakfast -Continue with current inhalers -Avoid sick contacts - albuterol inhaler - 2puff every 3-4 hours as needed for shortness of breath\wheezing\recurrent cough - albuterol inhaler - 2puff every 6 hours for 3 days, then 2puff as needed every 3-4 hours as needed for shortness of breath\wheezing\recurrent cough

## 2015-04-19 NOTE — Assessment & Plan Note (Signed)
I believe the patient has a component of extrinsic asthma, secondary to recurrent environmental allergens environmental weather changes. She is currently being followed by ENT/allergy, and is currently being managed with allergy drops.  Given the level of her allergies and her persistent symptoms it is reasonable to continue her dual inhaler therapy ( ICS/LABA). Advair has given her moderate relief and control of her current symptoms of wheezing and shortness of breath, but is now causing hoarse voice.  However, today with asthma exacerbation secondary to URI.  In time with control of her allergies, dual asthma therapy can probably be weaned down to monotherapy. The standard treatment and control of asthma is greatly maintained for an inhale corticosteroid.   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 - albuterol inhaler (rescue inhaler) - 2puff every 3-4 hours as needed for shortness of breath\wheezing\recurrent cough - continue with your current allergy regiment - continue with Allergist follow up for allergy drops.  -She noted to have moderate to severe restriction on her pulmonary function testing, this could be due to abdominal/chest wall obesity, we discussed weight loss/diet forward exercise at today's visit. If patient continues to be symptomatically follow-up visit given the level of restriction that she has a primary function testing may need further workup for interstitial lung disease via high-resolution CAT scan. This will be discussed with her at follow-up visit

## 2015-04-19 NOTE — Assessment & Plan Note (Addendum)
DDx - bronchitis, sinusitis, viral URI, ?Flu  Patient with flulike symptoms, currently have direct contact with other individuals with similar symptoms, however, no one with flu diagnosis. Required 2 DuoNeb treatments in the office due to significant wheezing. I suspect that she has a viral URI or atypical pneumonia.  Plan: -Levaquin 500 mg, 1 tab daily 7 days -Prednisone 20 mg, 1 tab 5 days, take with breakfast -Continue with current inhalers -Avoid sick contacts - albuterol inhaler - 2puff every 3-4 hours as needed for shortness of breath\wheezing\recurrent cough - albuterol inhaler - 2puff every 6 hours for 3 days, then 2puff as needed every 3-4 hours as needed for shortness of breath\wheezing\recurrent cough

## 2015-04-19 NOTE — Progress Notes (Signed)
[facility] Pulmonary Medicine Consultation      MRN# [id number] Kimberly Vasquez [birth year]   CC: Chief Complaint  Patient presents with  . Acute Visit    pt. c/o prod. cough clear in color. head congestion. wheezing. increased SOB. chest pain/tightness. fever. chills. body aches X3d.       Brief History: HPI 06-14-14 Patient is a pleasant 55 year old female with a past medical history of asthma, allergic rhinitis, hyperlipidemia, GERD and seen in consultation today for establish care for asthma. Off note, patient previously seen Dr. [name] a number of years ago for asthma management. Since then she has been following with her ENT/allergist physician for management of chronic sinusitis, allergic rhinitis, and allergy treatment. In the past when patient saw Dr. Meredeth Ide she was told she had asthmatic bronchitis, she was having recurrent bouts of wheezing/bronchitis, a PFT done at that time showed she had moderate obstruction she was placed on "inhalers" temporarily. Patient stated she had allergy skin testing done, which showed a number of positive outcomes. She recently saw her ENT physician who noted that she was having sinusitis and possibly triggering her asthma she was given a prednisone taper and started on Advair 250/50 (rotocaps); which significantly improved her symptoms and wheezing has resolved. Today patient endorses nasal congestion which is mostly clear and some throat clearing. Her allergies are worse in spring and fall. She has not had any recent E. emergency room or urgent care visits for breathing issues. Her current allergy regiment/asthma regiment include the following-Advair twice a day, Prilosec, Claritin, Singulair, Flonase, albuterol as needed. Patient states that she is probably used albuterol once in the past month. Patient stated she is currently on allergy drops as treatment for her chronic allergies, however, these are on hold he'll further pulmonary  evaluation.  Plan: - pulmonary function testing and 6 minute walk test prior to follow up - continue with Advair 250/50 twice a day - gargle and rinse after each use - albuterol inhaler (rescue inhaler) - 2puff every 3-4 hours as needed for shortness of breath\wheezing\recurrent cough - continue with your current allergy regiment - continue with Allergist follow up for allergy drops.  - obtain records from Dr. Meredeth Ide office  ROV 07/2014 Patient states that overall she is doing well. Currently getting allergy drops, which is helping with her symptoms of cough, still with head and nasal congestion.  Today had pfts and done.   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 -Advair 500/50, 1 puff twice a day, gargle and rinse after each use. - albuterol inhaler (rescue inhaler) - 2puff every 3-4 hours as needed for shortness of breath\wheezing\recurrent cough - continue with your current allergy regiment - continue with Allergist follow up for allergy drops.  -She noted to have moderate to severe restriction on her pulmonary function testing, this could be due to abdominal/chest wall obesity, we discussed weight loss/diet forward exercise at today's visit. If patient continues to be symptomatically follow-up visit given the level of restriction that she has a primary function testing may need further workup for interstitial lung disease via high-resolution CAT scan. This will be discussed with her at follow-up visit  Events since last clinic visit: Patient resents today for an acute visit of possible asthma exacerbation/sinusitis. The past 3 days she's had head congestion, cough, with mild thick sputum production, increased wheezing and shortness of breath, also subjective fevers. Review of her chart shows she had a similar episode in December 2016, was treated with Biaxin and prednisone.  Patient states last week she had a stomach virus in 24 hours with nausea, vomiting and diarrhea.  This resolved quickly. However her husband and her neighbors have had questionable flulike illness over the past week and she has been in direct contact with them. Over the last 2-3 days she's had increased cough, mild productive sputum, nasal congestion with yellowish drainage, chills, fever and endorses wheezing.   Medication:   Current Outpatient Rx  Name  Route  Sig  Dispense  Refill  . albuterol (PROVENTIL HFA;VENTOLIN HFA) 108 (90 BASE) MCG/ACT inhaler   Inhalation   Inhale 2 puffs into the lungs every 6 (six) hours as needed.         . clarithromycin (BIAXIN XL) 500 MG 24 hr tablet   Oral   Take 1 tablet (500 mg total) by mouth daily.   1 tablet   10   . dimenhyDRINATE (DRAMAMINE) 50 MG tablet   Oral   Take 50 mg by mouth every 8 (eight) hours as needed.         . fluticasone (FLONASE) 50 MCG/ACT nasal spray   Each Nare   Place 2 sprays into both nostrils daily.         . Fluticasone Furoate-Vilanterol (BREO ELLIPTA) 200-25 MCG/INH AEPB   Inhalation   Inhale 1 puff into the lungs daily.   60 each   5   . loratadine (CLARITIN) 10 MG tablet   Oral   Take 10 mg by mouth daily.         Marland Kitchen omeprazole (PRILOSEC) 20 MG capsule   Oral   Take 20 mg by mouth daily.         Marland Kitchen levofloxacin (LEVAQUIN) 500 MG tablet   Oral   Take 1 tablet (500 mg total) by mouth daily.   7 tablet   0   . predniSONE (DELTASONE) 20 MG tablet   Oral   Take 1 tablet (20 mg total) by mouth daily.   5 tablet   0      Review of Systems  Constitutional: Negative for fever, chills, weight loss and malaise/fatigue.  HENT: Positive for congestion and sore throat.   Eyes: Negative for double vision.  Respiratory: Positive for cough, sputum production, shortness of breath and wheezing. Negative for hemoptysis.   Cardiovascular: Negative for chest pain.  Skin: Negative for itching and rash.  Neurological: Negative for headaches.      Allergies:  Codeine  Physical Examination:    VS: BP 122/74 mmHg  Pulse 114  Temp(Src) 99.3 F (37.4 C)  Ht 5' 7.5" (1.715 m)  Wt 304 lb 12.8 oz (138.256 kg)  BMI 47.01 kg/m2  SpO2 94%  General Appearance: No distress  HEENT: PERRLA, no ptosis, no other lesions noticed Pulmonary: PreBronchodilator - diffuse wheezing throughout all lung fields, dec air movement, dec basilar BS PostBronchodilator - improve air movement, still with diffuse wheezing, but much improved.  Cardiovascular:  Normal S1,S2.  No m/r/g.     Abdomen:Exam: Benign, Soft, non-tender, No masses  Skin:   warm, no rashes, no ecchymosis  Extremities: normal, no cyanosis, clubbing, warm with normal capillary refill.    Assessment and Plan: 55 year old female with mild asthma, now presenting with acute asthma exacerbation secondary to upper respiratory tract infection URI (upper respiratory infection) DDx - bronchitis, sinusitis, viral URI, ?Flu  Patient with flulike symptoms, currently have direct contact with other individuals with similar symptoms, however, no one with flu diagnosis. Required 2 DuoNeb treatments in  the office due to significant wheezing. I suspect that she has a viral URI or atypical pneumonia.  Plan: -Levaquin 500 mg, 1 tab daily 7 days -Prednisone 20 mg, 1 tab 5 days, take with breakfast -Continue with current inhalers -Avoid sick contacts - albuterol inhaler - 2puff every 3-4 hours as needed for shortness of breath\wheezing\recurrent cough - albuterol inhaler - 2puff every 6 hours for 3 days, then 2puff as needed every 3-4 hours as needed for shortness of breath\wheezing\recurrent cough   Extrinsic asthma I believe the patient has a component of extrinsic asthma, secondary to recurrent environmental allergens environmental weather changes. She is currently being followed by ENT/allergy, and is currently being managed with allergy drops.  Given the level of her allergies and her persistent symptoms it is reasonable to continue her dual  inhaler therapy ( ICS/LABA). Advair has given her moderate relief and control of her current symptoms of wheezing and shortness of breath, but is now causing hoarse voice.  However, today with asthma exacerbation secondary to URI.  In time with control of her allergies, dual asthma therapy can probably be weaned down to monotherapy. The standard treatment and control of asthma is greatly maintained for an inhale corticosteroid.   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 - albuterol inhaler (rescue inhaler) - 2puff every 3-4 hours as needed for shortness of breath\wheezing\recurrent cough - continue with your current allergy regiment - continue with Allergist follow up for allergy drops.  -She noted to have moderate to severe restriction on her pulmonary function testing, this could be due to abdominal/chest wall obesity, we discussed weight loss/diet forward exercise at today's visit. If patient continues to be symptomatically follow-up visit given the level of restriction that she has a primary function testing may need further workup for interstitial lung disease via high-resolution CAT scan. This will be discussed with her at follow-up visit         Updated Medication List Outpatient Encounter Prescriptions as of 04/19/2015  Medication Sig  . albuterol (PROVENTIL HFA;VENTOLIN HFA) 108 (90 BASE) MCG/ACT inhaler Inhale 2 puffs into the lungs every 6 (six) hours as needed.  . clarithromycin (BIAXIN XL) 500 MG 24 hr tablet Take 1 tablet (500 mg total) by mouth daily.  Marland Kitchen dimenhyDRINATE (DRAMAMINE) 50 MG tablet Take 50 mg by mouth every 8 (eight) hours as needed.  . fluticasone (FLONASE) 50 MCG/ACT nasal spray Place 2 sprays into both nostrils daily.  . Fluticasone Furoate-Vilanterol (BREO ELLIPTA) 200-25 MCG/INH AEPB Inhale 1 puff into the lungs daily.  Marland Kitchen loratadine  (CLARITIN) 10 MG tablet Take 10 mg by mouth daily.  Marland Kitchen omeprazole (PRILOSEC) 20 MG capsule Take 20 mg by mouth daily.  Marland Kitchen levofloxacin (LEVAQUIN) 500 MG tablet Take 1 tablet (500 mg total) by mouth daily.  . predniSONE (DELTASONE) 20 MG tablet Take 1 tablet (20 mg total) by mouth daily.  . [DISCONTINUED] predniSONE (DELTASONE) 20 MG tablet Take 2 tablets (40 mg total) by mouth daily. (Patient not taking: Reported on 04/19/2015)   No facility-administered encounter medications on file as of 04/19/2015.    Orders for this visit: No orders of the defined types were placed in this encounter.    Thank  you for the visitation and for allowing  [facility] to assist in the care of your patient. Our recommendations are noted above.  Please contact us if we  can be of further service.  Vilinda Boehringer, MD [facility] Office Number: [phone]

## 2015-04-24 ENCOUNTER — Telehealth: Payer: Self-pay | Admitting: *Deleted

## 2015-04-24 NOTE — Telephone Encounter (Signed)
Pt seen VM on 04/19/15 for asthma and URI. He gave her Prednisone 20mg  daily x 5 days and Levaquin 500mg  daily x 7 days. Pt states the congestion is loose and when she coughs anything up it is clear, breathing better but is still wheezing. States she has 1 Levaquin left. She does have Albuterol inhaler prn. Please advise. Pt states she doesn't know what to do about the wheezing and states after the 1 Levaquin she thinks she will be back in.

## 2015-04-25 NOTE — Telephone Encounter (Signed)
Will you please advise on this patient. Thanks.

## 2015-04-26 MED ORDER — PREDNISONE 10 MG (21) PO TBPK
10.0000 mg | ORAL_TABLET | Freq: Every day | ORAL | Status: DC
Start: 2015-04-26 — End: 2015-10-10

## 2015-04-26 NOTE — Telephone Encounter (Signed)
Prednisone 10 mg tabs x 21, take 6 the first day, then 5-4-3-2-1-stop.  She should use albuterol MDI 2 puffs every 4 hours while awake.  If she does not find that the MDI has been helping  then we should prescribe a nebulizer machine and albuterol nebs, three times daily for one month, 1 refill.

## 2015-04-26 NOTE — Telephone Encounter (Signed)
Pt informed of response and informed to call back in a couple of days if albuterol inhaler isn't helping and we will set pt up with nebulizer machine and medication per DR response. Nothing further needed. Prednisone sent to pharmacy.

## 2015-05-01 ENCOUNTER — Ambulatory Visit: Payer: Self-pay | Admitting: Internal Medicine

## 2015-06-05 ENCOUNTER — Other Ambulatory Visit: Payer: Self-pay

## 2015-06-05 MED ORDER — FLUTICASONE FUROATE-VILANTEROL 200-25 MCG/INH IN AEPB: 1.0000 | INHALATION_SPRAY | Freq: Every day | RESPIRATORY_TRACT | Status: DC

## 2015-07-19 ENCOUNTER — Encounter: Payer: Self-pay | Admitting: *Deleted

## 2015-07-19 ENCOUNTER — Ambulatory Visit: Payer: Self-pay | Attending: Oncology | Admitting: *Deleted

## 2015-07-19 ENCOUNTER — Ambulatory Visit
Admission: RE | Admit: 2015-07-19 | Discharge: 2015-07-19 | Disposition: A | Discharge status: Home / Self Care | Payer: Self-pay | Source: Ambulatory Visit | Attending: Oncology | Admitting: Oncology

## 2015-07-19 VITALS — BP 131/94 | HR 71 | Temp 98.1°F | Ht 66.93 in | Wt 308.2 lb

## 2015-07-19 DIAGNOSIS — Z0000 Encounter for general adult medical examination without abnormal findings: Secondary | ICD-10-CM

## 2015-07-19 NOTE — Progress Notes (Signed)
Letter mailed from [facility] to inform patient of her normal mammogram results.  Patient is to follow-up with annual screening in one year.  HSIS to Christy. 

## 2015-07-19 NOTE — Patient Instructions (Signed)
Gave patient hand-out, Women Staying Healthy, Active and Well from [facility], with education on breast health, pap smears, heart and colon health. 

## 2015-07-19 NOTE — Progress Notes (Signed)
Subjective:     Patient ID: Kimberly Vasquez, female   DOB: 1960/09/28, 55 y.o.   MRN: [id number]  HPI   Review of Systems     Objective:   Physical Exam  Pulmonary/Chest: Right breast exhibits no inverted nipple, no mass, no nipple discharge, no skin change and no tenderness. Left breast exhibits no inverted nipple, no mass, no nipple discharge, no skin change and no tenderness. Breasts are symmetrical.  Large pendulous breast noted       Assessment:     55 year [facility] White [facility]  Clinical breast exam unremarkable.  Taught self breast awareness.  Blood pressure slightly elevated at 131/94.  Patient states she walked up the stairs to the office.  She is to recheck and if continues to stay elevated she is going to call [facility] to establish a primary care provider. Patient has been screened for eligibility.  She does not have any insurance, Medicare or Medicaid.  She also meets financial eligibility.  Hand-out given on the Affordable Care Act.     Plan:     Screening mammogram ordered.  Will follow-up per [facility] protocol.

## 2015-10-05 ENCOUNTER — Ambulatory Visit: Payer: Self-pay | Admitting: Internal Medicine

## 2015-10-10 ENCOUNTER — Ambulatory Visit (INDEPENDENT_AMBULATORY_CARE_PROVIDER_SITE_OTHER): Payer: Self-pay | Admitting: Internal Medicine

## 2015-10-10 ENCOUNTER — Encounter: Payer: Self-pay | Admitting: Internal Medicine

## 2015-10-10 ENCOUNTER — Encounter (INDEPENDENT_AMBULATORY_CARE_PROVIDER_SITE_OTHER): Payer: Self-pay

## 2015-10-10 VITALS — BP 122/80 | HR 85 | Ht 67.5 in | Wt 302.0 lb

## 2015-10-10 DIAGNOSIS — G4730 Sleep apnea, unspecified: Secondary | ICD-10-CM

## 2015-10-10 DIAGNOSIS — G4719 Other hypersomnia: Secondary | ICD-10-CM

## 2015-10-10 DIAGNOSIS — K219 Gastro-esophageal reflux disease without esophagitis: Secondary | ICD-10-CM

## 2015-10-10 MED ORDER — FLUTICASONE FUROATE-VILANTEROL 200-25 MCG/INH IN AEPB: 1.0000 | INHALATION_SPRAY | Freq: Every day | RESPIRATORY_TRACT | 11 refills | Status: DC

## 2015-10-10 MED ORDER — FLUTICASONE FUROATE-VILANTEROL 200-25 MCG/INH IN AEPB: 1.0000 | INHALATION_SPRAY | Freq: Every day | RESPIRATORY_TRACT | 0 refills | Status: AC

## 2015-10-10 NOTE — Addendum Note (Signed)
Addended by: Maxwell MarionBLANKENSHIP, MARGIE A on: 10/10/2015 12:07 PM   Modules accepted: Orders

## 2015-10-10 NOTE — Progress Notes (Signed)
  [facility] Consultation      MRN# [id number] Kimberly PatterRobin Machial Trapp [birth year]   CC: Chief Complaint  Patient presents with  . Follow-up    57mo rov. breathing is doing well. pt c/o occ sob w/exertion, bilateral rib pain "feels like a bubble" occ hurts into chest with bending X2380mo      HPI   Patient doing well on Breo 200 daily Infrequent use of albuterol last several months No signs of infection at this time  Has non-refreshed sleep, excessive tiredness and daytime sleepiness and fatigue during the day, loud snoring reported by husband Weighs 300 LBS  Complaining of GERD, increased belching, gas pains      Review of Systems  Constitutional: Positive for malaise/fatigue. Negative for chills, fever and weight loss.  HENT: Negative for congestion and sore throat.   Respiratory: Negative for cough, hemoptysis, sputum production, shortness of breath and wheezing.   Cardiovascular: Negative for chest pain, palpitations, orthopnea, claudication and leg swelling.  Gastrointestinal: Positive for abdominal pain and heartburn. Negative for diarrhea, nausea and vomiting.      Allergies:  Codeine  Physical Examination:  VS: BP 122/80 (BP Location: Left Arm, Cuff Size: Normal)   Pulse 85   Ht 5' 7.5" (1.715 m)   Wt (!) 302 lb (137 kg)   LMP 07/18/2000 (Approximate)   SpO2 96%   BMI 46.60 kg/m   General Appearance: No distress  HEENT: PERRLA, no ptosis, no other lesions noticed Pulmonary:normal breath sounds., diaphragmatic excursion normal.No wheezing, No rales   Cardiovascular:  Normal S1,S2.  No m/r/g.     No edema NEURO intact    Assessment and Plan:  55 yo morbidly obese white female with  mild intermittent uncomplicated ASTHMA with underlying GERD and signs and symptoms OSA  1.continue BREO 200 daily(written prescription provided,samples provided) 2.albuterol as needed 3.GI referral for GERD 4.continue PPI as prescribed 5.Referal to  Bariatric Surgery 6.obtain split night sleep study 7.recommend watching diet, exercise, weight loss   Follow up  in 3 months  The Patient requires high complexity decision making for assessment and support, frequent evaluation and titration of therapies. Patient satisfied with Plan of action and management. All questions answered  Lucie LeatherKurian David Socorro Kanitz, M.D.  [facility]  Medical Director [facility] Medical Director [facility]

## 2015-10-10 NOTE — Patient Instructions (Signed)
1.cotninue BREO 200 daily 2.albuterol as needed 3.referal to GI 4.referal to Bariatric Surgeon 5.sleep study-split night    Asthma Attack Prevention While you may not be able to control the fact that you have asthma, you can take actions to prevent asthma attacks. The best way to prevent asthma attacks is to maintain good control of your asthma. You can achieve this by:  Taking your medicines as directed.  Avoiding things that can irritate your airways or make your asthma symptoms worse (asthma triggers).  Keeping track of how well your asthma is controlled and of any changes in your symptoms.  Responding quickly to worsening asthma symptoms (asthma attack).  Seeking emergency care when it is needed. WHAT ARE SOME WAYS TO PREVENT AN ASTHMA ATTACK? Have a Plan Work with your health care provider to create a written plan for managing and treating your asthma attacks (asthma action plan). This plan includes:  A list of your asthma triggers and how you can avoid them.  Information on when medicines should be taken and when their dosages should be changed.  The use of a device that measures how well your lungs are working (peak flow meter). Monitor Your Asthma Use your peak flow meter and record your results in a journal every day. A drop in your peak flow numbers on one or more days may indicate the start of an asthma attack. This can happen even before you start to feel symptoms. You can prevent an asthma attack from getting worse by following the steps in your asthma action plan. Avoid Asthma Triggers Work with your asthma health care provider to find out what your asthma triggers are. This can be done by:  Allergy testing.  Keeping a journal that notes when asthma attacks occur and the factors that may have contributed to them.  Determining if there are other medical conditions that are making your asthma worse. Once you have determined your asthma triggers, take steps to avoid  them. This may include avoiding excessive or prolonged exposure to:  Dust. Have someone dust and vacuum your home for you once or twice a week. Using a high-efficiency particulate arrestance (HEPA) vacuum is best.  Smoke. This includes campfire smoke, forest fire smoke, and secondhand smoke from tobacco products.  Pet dander. Avoid contact with animals that you know you are allergic to.  Allergens from trees, grasses or pollens. Avoid spending a lot of time outdoors when pollen counts are high, and on very windy days.  Very cold, dry, or humid air.  Mold.  Foods that contain high amounts of sulfites.  Strong odors.  Outdoor air pollutants, such as Museum/gallery exhibitions officerengine exhaust.  Indoor air pollutants, such as aerosol sprays and fumes from household cleaners.  Household pests, including dust mites and cockroaches, and pest droppings.  Certain medicines, including NSAIDs. Always talk to your health care provider before stopping or starting any new medicines. Medicines Take over-the-counter and prescription medicines only as told by your health care provider. Many asthma attacks can be prevented by carefully following your medicine schedule. Taking your medicines correctly is especially important when you cannot avoid certain asthma triggers. Act Quickly If an asthma attack does happen, acting quickly can decrease how severe it is and how long it lasts. Take these steps:   Pay attention to your symptoms. If you are coughing, wheezing, or having difficulty breathing, do not wait to see if your symptoms go away on their own. Follow your asthma action plan.  If you have followed  your asthma action plan and your symptoms are not improving, call your health care provider or seek immediate medical care at the nearest hospital. It is important to note how often you need to use your fast-acting rescue inhaler. If you are using your rescue inhaler more often, it may mean that your asthma is not under control.  Adjusting your asthma treatment plan may help you to prevent future asthma attacks and help you to gain better control of your condition. HOW CAN I PREVENT AN ASTHMA ATTACK WHEN I EXERCISE? Follow advice from your health care provider about whether you should use your fast-acting inhaler before exercising. Many people with asthma experience exercise-induced bronchoconstriction (EIB). This condition often worsens during vigorous exercise in cold, humid, or dry environments. Usually, people with EIB can stay very active by pre-treating with a fast-acting inhaler before exercising.   This information is not intended to replace advice given to you by your health care provider. Make sure you discuss any questions you have with your health care provider.   Document Released: 01/23/2009 Document Revised: 10/26/2014 Document Reviewed: 07/07/2014 Elsevier Interactive Patient Education [employer]2016 [employer] Inc.

## 2015-10-27 ENCOUNTER — Other Ambulatory Visit: Payer: Self-pay

## 2015-11-03 ENCOUNTER — Telehealth: Payer: Self-pay | Admitting: Internal Medicine

## 2015-11-03 NOTE — Telephone Encounter (Signed)
Sue LushAndrea from [facility] calling to just let us know pt has an appointment with Dr Smitty CordsBruce in there office on 11/14/2015

## 2015-11-03 NOTE — Telephone Encounter (Signed)
FYI message below.

## 2015-11-14 DIAGNOSIS — K219 Gastro-esophageal reflux disease without esophagitis: Secondary | ICD-10-CM | POA: Insufficient documentation

## 2015-11-14 DIAGNOSIS — R0683 Snoring: Secondary | ICD-10-CM | POA: Insufficient documentation

## 2015-11-16 ENCOUNTER — Other Ambulatory Visit: Payer: Self-pay

## 2015-11-16 ENCOUNTER — Ambulatory Visit (INDEPENDENT_AMBULATORY_CARE_PROVIDER_SITE_OTHER): Payer: Self-pay | Admitting: Gastroenterology

## 2015-11-16 ENCOUNTER — Encounter: Payer: Self-pay | Admitting: Gastroenterology

## 2015-11-16 VITALS — BP 148/74 | HR 80 | Temp 97.8°F | Ht 67.5 in | Wt 304.5 lb

## 2015-11-16 DIAGNOSIS — R7302 Impaired glucose tolerance (oral): Secondary | ICD-10-CM | POA: Insufficient documentation

## 2015-11-16 DIAGNOSIS — K219 Gastro-esophageal reflux disease without esophagitis: Secondary | ICD-10-CM

## 2015-11-16 DIAGNOSIS — J45909 Unspecified asthma, uncomplicated: Secondary | ICD-10-CM | POA: Insufficient documentation

## 2015-11-16 NOTE — Progress Notes (Signed)
Gastroenterology Consultation  Referring Provider:     Myrene Buddy, * Primary Care Physician:  Myrene Buddy, NP Primary Gastroenterologist:  Dr. Servando Snare     Reason for Consultation:     GERD        HPI:   [name] is a 55 y.o. y/o female referred for consultation & management of GERD by Dr. Bayard Males, Hermenia Fiscal, NP.  This patient comes today with a long standing history of acid reflux disease. The patient reports that she is being evaluated for gastric bypass surgery. The patient reports that she takes over-the-counter antacid medication at the present time with little acid breakthrough. The patient also reports that she has gained a lot of weight since having her hysterectomy. She denies any black stools or bloody stools. The patient also denies nausea vomiting fevers or chills.   Past Medical History:  Diagnosis Date  . Allergic rhinitis   . Asthma without status asthmaticus   . GERD (gastroesophageal reflux disease)   . Gestational diabetes   . Hyperlipidemia   . IGT (impaired glucose tolerance)   . Obesity   . Osteopenia   . Vitamin D deficiency     Past Surgical History:  Procedure Laterality Date  . ABDOMINAL HYSTERECTOMY  2001  . BREAST BIOPSY Bilateral    x2 1979/1993  . GALLBLADDER SURGERY      Prior to Admission medications   Medication Sig Start Date End Date Taking? Authorizing Provider  albuterol (PROVENTIL HFA;VENTOLIN HFA) 108 (90 BASE) MCG/ACT inhaler Inhale 2 puffs into the lungs every 6 (six) hours as needed. 02/07/14  Yes Historical Provider, MD  dimenhyDRINATE (DRAMAMINE) 50 MG tablet Take 50 mg by mouth every 8 (eight) hours as needed.   Yes Historical Provider, MD  fluticasone (FLONASE) 50 MCG/ACT nasal spray Place 2 sprays into both nostrils daily.   Yes Historical Provider, MD  fluticasone furoate-vilanterol (BREO ELLIPTA) 200-25 MCG/INH AEPB Inhale 1 puff into the lungs daily. 06/05/15  Yes Vishal Mungal, MD  loratadine  (CLARITIN) 10 MG tablet Take 10 mg by mouth daily.   Yes Historical Provider, MD  omeprazole (PRILOSEC) 20 MG capsule Take 20 mg by mouth 2 (two) times daily before a meal.    Yes Historical Provider, MD    Family History  Problem Relation Age of Onset  . Heart disease Mother   . Asthma Mother   . Diabetes Mother   . Stroke Father   . Breast cancer Paternal Grandmother 41     Social History  Substance Use Topics  . Smoking status: Former Smoker    Packs/day: 0.50    Years: 8.00    Types: Cigarettes  . Smokeless tobacco: Never Used     Comment: quit smoking in 2000  . Alcohol use 0.0 oz/week     Comment: occasional glass of wine    Allergies as of 11/16/2015 - Review Complete 11/16/2015  Allergen Reaction Noted  . Codeine Nausea And Vomiting 06/14/2014    Review of Systems:    All systems reviewed and negative except where noted in HPI.   Physical Exam:  BP (!) 148/74   Pulse 80   Temp 97.8 F (36.6 C) (Oral)   Ht 5' 7.5" (1.715 m)   Wt (!) 304 lb 8 oz (138.1 kg)   LMP 07/18/2000 (Approximate)   BMI 46.99 kg/m  Patient's last menstrual period was 07/18/2000 (approximate). Psych:  Alert and cooperative. Normal mood and affect. General:   Alert,  Well-developed, Obese, well-nourished, pleasant and cooperative in NAD Head:  Normocephalic and atraumatic. Eyes:  Sclera clear, no icterus.   Conjunctiva pink. Ears:  Normal auditory acuity. Nose:  No deformity, discharge, or lesions. Mouth:  No deformity or lesions,oropharynx pink & moist. Neck:  Supple; no masses or thyromegaly. Lungs:  Respirations even and unlabored.  Clear throughout to auscultation.   No wheezes, crackles, or rhonchi. No acute distress. Heart:  Regular rate and rhythm; no murmurs, clicks, rubs, or gallops. Abdomen:  Normal bowel sounds.  No bruits.  Soft, non-tender and non-distended without masses, hepatosplenomegaly or hernias noted.  No guarding or rebound tenderness.  Negative Carnett sign.     Rectal:  Deferred.  Msk:  Symmetrical without gross deformities.  Good, equal movement & strength bilaterally. Pulses:  Normal pulses noted. Extremities:  No clubbing or edema.  No cyanosis. Neurologic:  Alert and oriented x3;  grossly normal neurologically. Skin:  Intact without significant lesions or rashes.  No jaundice. Lymph Nodes:  No significant cervical adenopathy. Psych:  Alert and cooperative. Normal mood and affect.  Imaging Studies: No results found.  Assessment and Plan:   Kimberly Vasquez is a 55 y.o. y/o female who comes in today with a history of long-standing heartburn. The patient also is considering gastric bypass surgery. The patient will be set up for an upper endoscopy due to her long-standing heartburn. The patient is well controlled on her present medications and will continue that. The patient has been explained the plan and agrees with it   Note: This dictation was prepared with Dragon dictation along with smaller phrase technology. Any transcriptional errors that result from this process are unintentional.

## 2015-11-17 ENCOUNTER — Other Ambulatory Visit: Payer: Self-pay

## 2015-12-19 ENCOUNTER — Telehealth: Payer: Self-pay | Admitting: Internal Medicine

## 2015-12-19 NOTE — Telephone Encounter (Signed)
Pt has a form that needs to be filled out for her Breo inhaler . Please call regarding this form

## 2015-12-19 NOTE — Telephone Encounter (Signed)
LMOM for pt to return call. 

## 2015-12-20 NOTE — Telephone Encounter (Signed)
Spoke with the pt and she states she has a RX she needs DK to sign for assistance with Breo. Informed pt if she would drop it off I would get him to sign and I will place in the mail for her. Will await the form.

## 2015-12-22 MED ORDER — FLUTICASONE FUROATE-VILANTEROL 200-25 MCG/INH IN AEPB: 1.0000 | INHALATION_SPRAY | Freq: Every day | RESPIRATORY_TRACT | 3 refills | Status: DC

## 2015-12-22 NOTE — Telephone Encounter (Signed)
RX signed and placed in envelope with forms. Spoke with pt and informed her the paperwork is being placed in the mail. Nothing further needed.

## 2016-01-02 ENCOUNTER — Ambulatory Visit: Payer: Self-pay | Admitting: Anesthesiology

## 2016-01-02 ENCOUNTER — Ambulatory Visit
Admission: RE | Admit: 2016-01-02 | Discharge: 2016-01-02 | Disposition: A | Discharge status: Home / Self Care | Payer: Self-pay | Source: Ambulatory Visit | Attending: Gastroenterology | Admitting: Gastroenterology

## 2016-01-02 ENCOUNTER — Encounter
Admission: RE | Disposition: A | Discharge status: Home / Self Care | Payer: Self-pay | Source: Ambulatory Visit | Attending: Gastroenterology

## 2016-01-02 DIAGNOSIS — J45909 Unspecified asthma, uncomplicated: Secondary | ICD-10-CM | POA: Insufficient documentation

## 2016-01-02 DIAGNOSIS — E559 Vitamin D deficiency, unspecified: Secondary | ICD-10-CM | POA: Insufficient documentation

## 2016-01-02 DIAGNOSIS — E119 Type 2 diabetes mellitus without complications: Secondary | ICD-10-CM | POA: Insufficient documentation

## 2016-01-02 DIAGNOSIS — M8580 Other specified disorders of bone density and structure, unspecified site: Secondary | ICD-10-CM | POA: Insufficient documentation

## 2016-01-02 DIAGNOSIS — Z6841 Body Mass Index (BMI) 40.0 and over, adult: Secondary | ICD-10-CM | POA: Insufficient documentation

## 2016-01-02 DIAGNOSIS — K219 Gastro-esophageal reflux disease without esophagitis: Secondary | ICD-10-CM | POA: Insufficient documentation

## 2016-01-02 DIAGNOSIS — Z87891 Personal history of nicotine dependence: Secondary | ICD-10-CM | POA: Insufficient documentation

## 2016-01-02 DIAGNOSIS — Z823 Family history of stroke: Secondary | ICD-10-CM | POA: Insufficient documentation

## 2016-01-02 DIAGNOSIS — K317 Polyp of stomach and duodenum: Secondary | ICD-10-CM | POA: Insufficient documentation

## 2016-01-02 DIAGNOSIS — Z90710 Acquired absence of both cervix and uterus: Secondary | ICD-10-CM | POA: Insufficient documentation

## 2016-01-02 DIAGNOSIS — Z8249 Family history of ischemic heart disease and other diseases of the circulatory system: Secondary | ICD-10-CM | POA: Insufficient documentation

## 2016-01-02 DIAGNOSIS — E785 Hyperlipidemia, unspecified: Secondary | ICD-10-CM | POA: Insufficient documentation

## 2016-01-02 DIAGNOSIS — Z825 Family history of asthma and other chronic lower respiratory diseases: Secondary | ICD-10-CM | POA: Insufficient documentation

## 2016-01-02 DIAGNOSIS — Z885 Allergy status to narcotic agent status: Secondary | ICD-10-CM | POA: Insufficient documentation

## 2016-01-02 HISTORY — DX: Type 2 diabetes mellitus without complications: E11.9

## 2016-01-02 HISTORY — PX: ESOPHAGOGASTRODUODENOSCOPY (EGD) WITH PROPOFOL: SHX5813

## 2016-01-02 SURGERY — ESOPHAGOGASTRODUODENOSCOPY (EGD) WITH PROPOFOL
Anesthesia: General

## 2016-01-02 MED ORDER — MIDAZOLAM HCL 2 MG/2ML IJ SOLN
INTRAMUSCULAR | Status: DC | PRN
  Administered 2016-01-02: 1 mg via INTRAVENOUS

## 2016-01-02 MED ORDER — PROPOFOL 500 MG/50ML IV EMUL
INTRAVENOUS | Status: DC | PRN
  Administered 2016-01-02: 125 ug/kg/min via INTRAVENOUS

## 2016-01-02 MED ORDER — PROPOFOL 10 MG/ML IV BOLUS
INTRAVENOUS | Status: DC | PRN
  Administered 2016-01-02: 50 mg via INTRAVENOUS

## 2016-01-02 MED ORDER — SODIUM CHLORIDE 0.9 % IV SOLN
INTRAVENOUS | Status: DC
  Administered 2016-01-02: 09:00:00 via INTRAVENOUS

## 2016-01-02 NOTE — Anesthesia Preprocedure Evaluation (Signed)
Anesthesia Evaluation  Patient identified by MRN, date of birth, ID band Patient awake    Reviewed: Allergy & Precautions, NPO status , Patient's Chart, lab work & pertinent test results  Airway Mallampati: II       Dental  (+) Teeth Intact   Pulmonary asthma , former smoker,     + decreased breath sounds      Cardiovascular Exercise Tolerance: Poor  Rhythm:Regular Rate:Normal     Neuro/Psych negative neurological ROS  negative psych ROS   GI/Hepatic Neg liver ROS, GERD  Medicated,  Endo/Other  diabetesMorbid obesity  Renal/GU      Musculoskeletal   Abdominal (+) + obese,   Peds negative pediatric ROS (+)  Hematology   Anesthesia Other Findings   Reproductive/Obstetrics                             Anesthesia Physical Anesthesia Plan  ASA: III  Anesthesia Plan: General   Post-op Pain Management:    Induction: Intravenous  Airway Management Planned: Natural Airway and Nasal Cannula  Additional Equipment:   Intra-op Plan:   Post-operative Plan:   Informed Consent: I have reviewed the patients History and Physical, chart, labs and discussed the procedure including the risks, benefits and alternatives for the proposed anesthesia with the patient or authorized representative who has indicated his/her understanding and acceptance.     Plan Discussed with: CRNA  Anesthesia Plan Comments:         Anesthesia Quick Evaluation

## 2016-01-02 NOTE — Transfer of Care (Signed)
Immediate Anesthesia Transfer of Care Note  Patient: Kimberly Vasquez  Procedure(s) Performed: Procedure(s): ESOPHAGOGASTRODUODENOSCOPY (EGD) WITH PROPOFOL (N/A)  Patient Location: PACU  Anesthesia Type:General  Level of Consciousness: awake and alert   Airway & Oxygen Therapy: Patient Spontanous Breathing and Patient connected to nasal cannula oxygen  Post-op Assessment: Report given to RN and Post -op Vital signs reviewed and stable  Post vital signs: Reviewed and stable  Last Vitals:  Vitals:   01/02/16 0839 01/02/16 0857  BP: (!) 163/81 (!) 113/54  Pulse: 86 94  Resp: 18 11  Temp: 36.9 C (!) 35.7 C    Last Pain:  Vitals:   01/02/16 0857  TempSrc: Tympanic         Complications: No apparent anesthesia complications

## 2016-01-02 NOTE — Anesthesia Postprocedure Evaluation (Signed)
Anesthesia Post Note  Patient: Adair PatterRobin Machial Wetherby  Procedure(s) Performed: Procedure(s) (LRB): ESOPHAGOGASTRODUODENOSCOPY (EGD) WITH PROPOFOL (N/A)  Patient location during evaluation: PACU Anesthesia Type: General Level of consciousness: awake Pain management: pain level controlled Vital Signs Assessment: post-procedure vital signs reviewed and stable Respiratory status: spontaneous breathing Cardiovascular status: stable Anesthetic complications: no    Last Vitals:  Vitals:   01/02/16 0857 01/02/16 0900  BP: (!) 113/54 (!) 113/54  Pulse: 94   Resp: 11   Temp: (!) 35.7 C     Last Pain:  Vitals:   01/02/16 0857  TempSrc: Tympanic                 VAN STAVEREN,Eligah Anello

## 2016-01-02 NOTE — Op Note (Signed)
[facility] Gastroenterology Patient Name: Kimberly FountainRobin Beyl Procedure Date: 01/02/2016 8:43 AM MRN: [id number] Account #: [id number] Date of Birth: Oct 05, 1960 Admit Type: Outpatient Age: [age] Room[facility] ENDO [facility]OM 4 Gender: Female Note Status: Finalized Procedure:            Upper GI endoscopy Indications:          Heartburn, Preoperative assessment for bariatric                        surgery to treat morbid obesity Providers:            Midge Miniumarren Wylodean Shimmel MD, MD Referring MD:         No Local Md, MD (Referring MD) Medicines:            Propofol per Anesthesia Complications:        No immediate complications. Procedure:            Pre-Anesthesia Assessment:                       - Prior to the procedure, a History and Physical was                        performed, and patient medications and allergies were                        reviewed. The patient's tolerance of previous                        anesthesia was also reviewed. The risks and benefits of                        the procedure and the sedation options and risks were                        discussed with the patient. All questions were                        answered, and informed consent was obtained. Prior                        Anticoagulants: The patient has taken no previous                        anticoagulant or antiplatelet agents. ASA Grade                        Assessment: II - A patient with mild systemic disease.                        After reviewing the risks and benefits, the patient was                        deemed in satisfactory condition to undergo the                        procedure.                       After obtaining informed consent, the endoscope was  passed under direct vision. Throughout the procedure,                        the patient's blood pressure, pulse, and oxygen                        saturations were monitored continuously. The Endoscope          was introduced through the mouth, and advanced to the                        second part of duodenum. The upper GI endoscopy was                        accomplished without difficulty. The patient tolerated                        the procedure well. Findings:      The examined esophagus was normal.      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 Biopsies were taken with a cold forceps for histology.      ile throughout the stomach.      The examined duodenum was normal.      Small hernia in the fundus seen. Impression:           - Normal esophagus.                       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 Biopsied.                       - ile throughout the stomach.                       - Normal examined duodenum.                       - Small hernia in the fundus seen. Recommendation:       - Await pathology results.                       - Discharge patient to home.                       - Resume previous diet.                       - Continue present medications.                       - Patient cleared for weight loss surgery pending                        biopsy results. Procedure Code(s):    --- Professional ---                       [phone], Esophagogastroduodenoscopy,  flexible, transoral;                        with biopsy, single or multiple Diagnosis Code(s):    --- Professional ---                       R12, Heartburn                       Z01.818, Encounter for other preprocedural examination                       E66.01, Morbid (severe) obesity due to excess calories                       K31.7, Polyp of stomach and duodenum CPT copyright 2016 American Medical Association. All rights reserved. The codes documented in this report are preliminary and upon coder review may  be revised to meet  current compliance requirements. Midge Miniumarren Kaenan Jake MD, MD 01/02/2016 8:57:55 AM This report has been signed electronically. Number of Addenda: 0 Note Initiated On: 01/02/2016 8:43 AM      [facility]

## 2016-01-02 NOTE — H&P (Signed)
Kimberly Miniumarren Landynn Dupler, MD [facility] [address] [place], [place] [address] Phone: [phone] Fax : [phone]  Primary Care Physician:  Myrene BuddyGAUGER, SARAH KATHRYN, NP Primary Gastroenterologist:  Dr. Servando SnareWohl  Pre-Procedure History & Physical: HPI:  Kimberly Vasquez is a 55 y.o. female is here for an endoscopy.   Past Medical History:  Diagnosis Date  . Allergic rhinitis   . Asthma without status asthmaticus   . Diabetes mellitus without complication (HCC)   . GERD (gastroesophageal reflux disease)   . Hyperlipidemia   . IGT (impaired glucose tolerance)   . Obesity   . Osteopenia   . Vitamin D deficiency     Past Surgical History:  Procedure Laterality Date  . ABDOMINAL HYSTERECTOMY  2001  . BREAST BIOPSY Bilateral    x2 1979/1993  . GALLBLADDER SURGERY      Prior to Admission medications   Medication Sig Start Date End Date Taking? Authorizing Provider  albuterol (PROVENTIL HFA;VENTOLIN HFA) 108 (90 BASE) MCG/ACT inhaler Inhale 2 puffs into the lungs every 6 (six) hours as needed. 02/07/14  Yes Historical Provider, MD  fluticasone (FLONASE) 50 MCG/ACT nasal spray Place 2 sprays into both nostrils daily.   Yes Historical Provider, MD  fluticasone furoate-vilanterol (BREO ELLIPTA) 200-25 MCG/INH AEPB Inhale 1 puff into the lungs daily. 12/22/15  Yes Erin FullingKurian Kasa, MD  ibuprofen (ADVIL,MOTRIN) 200 MG tablet Take 400 mg by mouth every 6 (six) hours as needed.   Yes Historical Provider, MD  loratadine (CLARITIN) 10 MG tablet Take 10 mg by mouth daily.   Yes Historical Provider, MD  omeprazole (PRILOSEC) 20 MG capsule Take 40 mg by mouth daily.    Yes Historical Provider, MD  dimenhyDRINATE (DRAMAMINE) 50 MG tablet Take 50 mg by mouth every 8 (eight) hours as needed.    Historical Provider, MD    Allergies as of 11/17/2015 - Review Complete 11/16/2015  Allergen Reaction Noted  . Codeine Nausea And Vomiting 06/14/2014    Family History  Problem Relation Age of Onset  . Heart  disease Mother   . Asthma Mother   . Diabetes Mother   . Stroke Father   . Breast cancer Paternal Grandmother [id number]    Social History   Social History  . Marital status: Legally Separated    Spouse name: N/A  . Number of children: N/A  . Years of education: N/A   Occupational History  . Not on file.   Social History Main Topics  . Smoking status: Former Smoker    Packs/day: 0.50    Years: 8.00    Types: Cigarettes  . Smokeless tobacco: Never Used     Comment: quit smoking in 2000  . Alcohol use 0.0 oz/week     Comment: occasional glass of wine, last time friday  . Drug use: No  . Sexual activity: Not on file   Other Topics Concern  . Not on file   Social History Narrative  . No narrative on file    Review of Systems: See HPI, otherwise negative ROS  Physical Exam: BP (!) 113/54 (BP Location: Right Arm)   Pulse 94   Temp (!) 96.3 F (35.7 C) (Tympanic)   Resp 11   Ht 5' 7.5" (1.715 m)   Wt (!) 305 lb (138.3 kg)   LMP 07/18/2000 (Approximate)   SpO2 98%   BMI 47.06 kg/m  General:   Alert,  pleasant and cooperative in NAD Head:  Normocephalic and atraumatic. Neck:  Supple; no masses or thyromegaly.  Lungs:  Clear throughout to auscultation.    Heart:  Regular rate and rhythm. Abdomen:  Soft, nontender and nondistended. Normal bowel sounds, without guarding, and without rebound.   Neurologic:  Alert and  oriented x4;  grossly normal neurologically.  Impression/Plan: Kimberly Vasquez is here for an endoscopy to be performed for GERD and pr op bypass surgery.  Risks, benefits, limitations, and alternatives regarding  endoscopy have been reviewed with the patient.  Questions have been answered.  All parties agreeable.   Kimberly Miniumarren Aysha Livecchi, MD  01/02/2016, 8:59 AM

## 2016-01-03 ENCOUNTER — Encounter: Payer: Self-pay | Admitting: Gastroenterology

## 2016-01-04 LAB — SURGICAL PATHOLOGY

## 2016-01-07 ENCOUNTER — Encounter: Payer: Self-pay | Admitting: Gastroenterology

## 2016-01-25 ENCOUNTER — Ambulatory Visit
Admission: RE | Admit: 2016-01-25 | Discharge: 2016-01-25 | Disposition: A | Discharge status: Home / Self Care | Payer: Self-pay | Source: Ambulatory Visit | Attending: Family Medicine | Admitting: Family Medicine

## 2016-01-25 ENCOUNTER — Ambulatory Visit
Admission: EM | Admit: 2016-01-25 | Discharge: 2016-01-25 | Disposition: A | Discharge status: Other Acute Inpatient Hospital | Payer: Self-pay | Attending: Family Medicine | Admitting: Family Medicine

## 2016-01-25 ENCOUNTER — Encounter: Payer: Self-pay | Admitting: *Deleted

## 2016-01-25 ENCOUNTER — Ambulatory Visit (INDEPENDENT_AMBULATORY_CARE_PROVIDER_SITE_OTHER): Payer: Self-pay

## 2016-01-25 DIAGNOSIS — M25562 Pain in left knee: Secondary | ICD-10-CM

## 2016-01-25 DIAGNOSIS — M79662 Pain in left lower leg: Secondary | ICD-10-CM

## 2016-01-25 MED ORDER — MELOXICAM 15 MG PO TABS: 15.0000 mg | ORAL_TABLET | Freq: Every day | ORAL | 0 refills | Status: DC

## 2016-01-25 NOTE — ED Triage Notes (Signed)
Patient starting having left knee pain 2 weeks ago. Mechanism of injury unknown.

## 2016-01-25 NOTE — ED Provider Notes (Signed)
[facility]    CSN: [id number] Arrival date & time: 01/25/16  1310     History   Chief Complaint Chief Complaint  Patient presents with  . Knee Pain    HPI Kimberly Vasquez is a 55 y.o. female.   Patient is a 55 year old white female with left knee pain. Patient started having left knee pain about 2-3 weeks ago. Just when the knee feels like is getting better she states she does something that makes it seem to get worse. First I was climbing into her son's truck which struck the knee was almost well and that she started having more pain in the left knee. [place] and did a little bit of walking in the next day or by that evening he was throbbing started hurting again. Because knee has continued to hurt and cause pain she came in to be seen and evaluated. Also she has had pain in the back of her left knee as well and she is concerned about that also.  History of multiple problems she has allergic asthma hypertension hyperlipidemia obesity. She recently had an upper endoscopy that she started him a candidate for gastric bypass. She's had a breast biopsy abdominal hysterectomy and gallbladder surgery as well. No pertinent family medical history except that her mother did have blood clots in her legs and patient is a former smoker.     The history is provided by the patient. No language interpreter was used.  Knee Pain  Location:  Knee Time since incident:  3 weeks Injury: no   Knee location:  L knee Pain details:    Quality:  Aching, pressure and throbbing   Radiates to:  L leg   Severity:  Moderate   Onset quality:  Unable to specify   Duration:  3 weeks   Timing:  Constant   Progression:  Worsening Chronicity:  New Dislocation: no   Foreign body present:  No foreign bodies Prior injury to area:  No Relieved by:  Nothing Worsened by:  Activity and bearing weight Ineffective treatments:  None tried Associated symptoms: muscle  weakness, swelling and tingling   Risk factors: obesity   Risk factors: no concern for non-accidental trauma     Past Medical History:  Diagnosis Date  . Allergic rhinitis   . Asthma without status asthmaticus   . Diabetes mellitus without complication (HCC)   . GERD (gastroesophageal reflux disease)   . Hyperlipidemia   . IGT (impaired glucose tolerance)   . Obesity   . Osteopenia   . Vitamin D deficiency     Patient Active Problem List   Diagnosis Date Noted  . Asthma without status asthmaticus 11/16/2015  . IGT (impaired glucose tolerance) 11/16/2015  . URI (upper respiratory infection) 04/19/2015  . Morbid obesity (HCC) 08/15/2014  . Extrinsic asthma 06/14/2014    Past Surgical History:  Procedure Laterality Date  . ABDOMINAL HYSTERECTOMY  2001  . BREAST BIOPSY Bilateral    x2 1979/1993  . ESOPHAGOGASTRODUODENOSCOPY (EGD) WITH PROPOFOL N/A 01/02/2016   Procedure: ESOPHAGOGASTRODUODENOSCOPY (EGD) WITH PROPOFOL;  Surgeon: Midge Miniumarren Wohl, MD;  Location: [facility];  Service: Endoscopy;  Laterality: N/A;  . GALLBLADDER SURGERY      OB History    No data available       Home Medications    Prior to Admission medications   Medication Sig Start Date End Date Taking? Authorizing Provider  fluticasone furoate-vilanterol (BREO ELLIPTA) 200-25 MCG/INH AEPB Inhale 1  puff into the lungs daily. 12/22/15  Yes Erin Fulling, MD  ibuprofen (ADVIL,MOTRIN) 200 MG tablet Take 400 mg by mouth every 6 (six) hours as needed.   Yes Historical Provider, MD  loratadine (CLARITIN) 10 MG tablet Take 10 mg by mouth daily.   Yes Historical Provider, MD  omeprazole (PRILOSEC) 20 MG capsule Take 40 mg by mouth daily.    Yes Historical Provider, MD  albuterol (PROVENTIL HFA;VENTOLIN HFA) 108 (90 BASE) MCG/ACT inhaler Inhale 2 puffs into the lungs every 6 (six) hours as needed. 02/07/14   Historical Provider, MD  dimenhyDRINATE (DRAMAMINE) 50 MG tablet Take 50 mg by mouth every 8 (eight) hours as  needed.    Historical Provider, MD  fluticasone (FLONASE) 50 MCG/ACT nasal spray Place 2 sprays into both nostrils daily.    Historical Provider, MD  meloxicam (MOBIC) 15 MG tablet Take 1 tablet (15 mg total) by mouth daily. 01/25/16   Hassan Rowan, MD    Family History Family History  Problem Relation Age of Onset  . Heart disease Mother   . Asthma Mother   . Diabetes Mother   . Stroke Father   . Breast cancer Paternal Grandmother 38    Social History Social History  Substance Use Topics  . Smoking status: Former Smoker    Packs/day: 0.50    Years: 8.00    Types: Cigarettes  . Smokeless tobacco: Never Used     Comment: quit smoking in 2000  . Alcohol use 0.0 oz/week     Comment: occasional glass of wine, last time friday     Allergies   Codeine   Review of Systems Review of Systems  Musculoskeletal: Positive for arthralgias, gait problem, joint swelling and myalgias.  All other systems reviewed and are negative.    Physical Exam Triage Vital Signs ED Triage Vitals  Enc Vitals Group     BP      Pulse      Resp      Temp      Temp src      SpO2      Weight      Height      Head Circumference      Peak Flow      Pain Score      Pain Loc      Pain Edu?      Excl. in GC?    No data found.   Updated Vital Signs BP (!) 119/41 (BP Location: Left Wrist)   Pulse 82   Temp 97.8 F (36.6 C) (Oral)   Resp 16   Ht 5' 7.5" (1.715 m)   Wt (!) 305 lb (138.3 kg)   LMP 07/18/2000 (Approximate)   SpO2 98%   BMI 47.06 kg/m   Visual Acuity Right Eye Distance:   Left Eye Distance:   Bilateral Distance:    Right Eye Near:   Left Eye Near:    Bilateral Near:     Physical Exam  Constitutional: She is oriented to person, place, and time. She appears well-developed and well-nourished.  HENT:  Head: Normocephalic.  Right Ear: External ear normal.  Left Ear: External ear normal.  Eyes: EOM are normal. Pupils are equal, round, and reactive to light.  Neck:  Normal range of motion. Neck supple.  Pulmonary/Chest: Effort normal.  Musculoskeletal: She exhibits edema and tenderness. She exhibits no deformity.       Left knee: She exhibits swelling, effusion and bony tenderness. She exhibits normal range  of motion, no ecchymosis, no deformity, no laceration, no LCL laxity and no MCL laxity.       Legs: Neurological: She is alert and oriented to person, place, and time.  Skin: Skin is warm and dry. No erythema.  Psychiatric: She has a normal mood and affect.  Vitals reviewed.    UC Treatments / Results  Labs (all labs ordered are listed, but only abnormal results are displayed) Labs Reviewed - No data to display  EKG  EKG Interpretation None       Radiology Dg Knee Complete 4 Views Left  Result Date: 01/25/2016 CLINICAL DATA:  Left knee pain 2 weeks ago without known injury. EXAM: LEFT KNEE - COMPLETE 4+ VIEW COMPARISON:  None. FINDINGS: No evidence of fracture, dislocation, or joint effusion. No evidence of arthropathy or other focal bone abnormality. Soft tissues are unremarkable. IMPRESSION: Negative. Electronically Signed   By: Kennith CenterEric  Mansell M.D.   On: 01/25/2016 14:36    Procedures Procedures (including critical care time)  Medications Ordered in UC Medications - No data to display   Initial Impression / Assessment and Plan / UC Course  I have reviewed the triage vital signs and the nursing notes.  Pertinent labs & imaging results that were available during my care of the patient were reviewed by me and considered in my medical decision making (see chart for details).  Clinical Course as of Jan 24 1449  Thu Jan 25, 2016  1332 BP: (!) 119/41 [EW]    Clinical Course User Index [EW] Hassan RowanEugene Giuseppe Duchemin, MD  Because the pain over the left calf on the lateral aspect was sent for Doppler study. If that and the x-rays are negative with Mobic 15 mg 1 tablet a day follow-up for PCP in 1-2 weeks recommend using a cane for stability and  support and taking UC on the   Final Clinical Impressions(s) / UC Diagnoses   Final diagnoses:  Acute pain of left knee  Tenderness of left calf    New Prescriptions New Prescriptions   MELOXICAM (MOBIC) 15 MG TABLET    Take 1 tablet (15 mg total) by mouth daily.    She was notified that the ultrasound of her left lower leg was negative showing no signs of blood clots. Recommend resting child not to overdo the left leg use a cane for support follow-up with her PCP in 2 weeks not better.    Note: This dictation was prepared with Dragon dictation along with smaller phrase technology. Any transcriptional errors that result from this process are unintentional.     Hassan RowanEugene Ahmoni Edge, MD 01/25/16 1635

## 2016-01-28 ENCOUNTER — Telehealth: Payer: Self-pay

## 2016-01-28 NOTE — Telephone Encounter (Signed)
Courtesy call back completed today for patient's recent visit at [facility]. Patient did not answer, left message on machine to call back with any questions or concerns.   

## 2016-02-29 ENCOUNTER — Telehealth: Payer: Self-pay | Admitting: Internal Medicine

## 2016-02-29 NOTE — Telephone Encounter (Signed)
[facility] Thank you!

## 2016-02-29 NOTE — Telephone Encounter (Signed)
Pt calling stating she has had some wheezing for the past two days She thinks it is the start of Bronchitis  Would like to know what we can do  Please advise.

## 2016-03-01 NOTE — Telephone Encounter (Signed)
Spoke with pt and she states she isn't really wheezing but does have head congestion and NP cough. She is asking what else she can take since she is feeling some better. She has been taking Mucinex D. I advised to try OTC Aleve cold and sinus. Pt states if no better by early next week she will call back. Nothing further needed at this time.

## 2016-03-01 NOTE — Telephone Encounter (Signed)
Sent to the wrong pool. °

## 2016-03-01 NOTE — Telephone Encounter (Signed)
LMOM for pt to return call. 

## 2016-03-03 ENCOUNTER — Encounter: Payer: Self-pay | Admitting: Gynecology

## 2016-03-03 ENCOUNTER — Ambulatory Visit
Admission: EM | Admit: 2016-03-03 | Discharge: 2016-03-03 | Disposition: A | Discharge status: Home / Self Care | Payer: Self-pay | Attending: Emergency Medicine | Admitting: Emergency Medicine

## 2016-03-03 DIAGNOSIS — J069 Acute upper respiratory infection, unspecified: Secondary | ICD-10-CM

## 2016-03-03 DIAGNOSIS — J45901 Unspecified asthma with (acute) exacerbation: Secondary | ICD-10-CM

## 2016-03-03 MED ORDER — DEXAMETHASONE 4 MG PO TABS: ORAL_TABLET | ORAL | 0 refills | Status: DC

## 2016-03-03 MED ORDER — HYDROCOD POLST-CPM POLST ER 10-8 MG/5ML PO SUER: 5.0000 mL | Freq: Two times a day (BID) | ORAL | 0 refills | Status: DC | PRN

## 2016-03-03 MED ORDER — IPRATROPIUM-ALBUTEROL 0.5-2.5 (3) MG/3ML IN SOLN
3.0000 mL | Freq: Once | RESPIRATORY_TRACT | Status: AC
  Administered 2016-03-03: 3 mL via RESPIRATORY_TRACT

## 2016-03-03 MED ORDER — FLUTICASONE PROPIONATE 50 MCG/ACT NA SUSP: 2.0000 | Freq: Every day | NASAL | 0 refills | Status: DC

## 2016-03-03 MED ORDER — DEXAMETHASONE SODIUM PHOSPHATE 10 MG/ML IJ SOLN
12.0000 mg | Freq: Once | INTRAMUSCULAR | Status: AC
  Administered 2016-03-03: 12 mg

## 2016-03-03 MED ORDER — BENZONATATE 200 MG PO CAPS: 200.0000 mg | ORAL_CAPSULE | Freq: Three times a day (TID) | ORAL | 0 refills | Status: DC | PRN

## 2016-03-03 NOTE — Discharge Instructions (Signed)
16 mg of oral dexamethasone tomorrow 1. .  Flonase, saline nasal irrigation and to continue Mucinex D. with Tussionex for cough at night Tessalon Perles for cough during the day  regular albuterol 2 puffs with your spacer every 4-6 hours as needed for coughing, wheezing, shortness of breath,

## 2016-03-03 NOTE — ED Triage Notes (Signed)
Patient c/o cough / wheezing/ congestion x 5 days. Per patient was not able to get an appointment with Pulmonary doctor.

## 2016-03-03 NOTE — ED Provider Notes (Signed)
HPI  SUBJECTIVE:  Kimberly Vasquez is a 56 y.o. female who presents with URI symptoms with clear nasal congestion, rhinorrhea, postnasal drip scratchy sore throat secondary to coughing for the past 5 days. She reports states that she is unable to sleep secondary to all the coughing at night. She reports chest tightness which is identical to previous asthma exacerbations, shortness of breath, dyspnea on exertion and wheezing.. She states symptoms are better with hot water and steam, Aleve cold medicine, Mucinex D.. She is also using her Flonase occasionally. Symptoms are worse with lying down. Makes her coughing worse .She denies fevers, sinus pain or pressure, chest heaviness or pressure, lower extremity edema, PND, unintentional weight gain, nocturia, orthopnea. No antipyretic in the past 6-8 hours.   She has a past medical history of asthma for which she is on Breo and she has a rescue albuterol inhaler. She states that she has not used the albuterol inhaler yet. She states she does have a spacer with this. Denies history of hypertension, diabetes, CHF, MI, coronary disease. PMD: None. Patient states that she sees her pulmonologist on a regular basis. She states that the office is to contact her tomorrow and will work her in for same-day appointment. Pulm: [facility]  Past Medical History:  Diagnosis Date  . Allergic rhinitis   . Asthma without status asthmaticus   . Diabetes mellitus without complication (HCC)   . GERD (gastroesophageal reflux disease)   . Hyperlipidemia   . IGT (impaired glucose tolerance)   . Obesity   . Osteopenia   . Vitamin D deficiency     Past Surgical History:  Procedure Laterality Date  . ABDOMINAL HYSTERECTOMY  2001  . BREAST BIOPSY Bilateral    x2 1979/1993  . ESOPHAGOGASTRODUODENOSCOPY (EGD) WITH PROPOFOL N/A 01/02/2016   Procedure: ESOPHAGOGASTRODUODENOSCOPY (EGD) WITH PROPOFOL;  Surgeon: Midge Minium, MD;  Location: [facility];  Service: Endoscopy;   Laterality: N/A;  . GALLBLADDER SURGERY      Family History  Problem Relation Age of Onset  . Heart disease Mother   . Asthma Mother   . Diabetes Mother   . Stroke Father   . Breast cancer Paternal Grandmother 53    Social History  Substance Use Topics  . Smoking status: Former Smoker    Packs/day: 0.50    Years: 8.00    Types: Cigarettes  . Smokeless tobacco: Never Used     Comment: quit smoking in 2000  . Alcohol use 0.0 oz/week     Comment: occasional glass of wine, last time friday    No current facility-administered medications for this encounter.   Current Outpatient Prescriptions:  .  albuterol (PROVENTIL HFA;VENTOLIN HFA) 108 (90 BASE) MCG/ACT inhaler, Inhale 2 puffs into the lungs every 6 (six) hours as needed., Disp: , Rfl:  .  dimenhyDRINATE (DRAMAMINE) 50 MG tablet, Take 50 mg by mouth every 8 (eight) hours as needed., Disp: , Rfl:  .  fluticasone furoate-vilanterol (BREO ELLIPTA) 200-25 MCG/INH AEPB, Inhale 1 puff into the lungs daily., Disp: 180 each, Rfl: 3 .  ibuprofen (ADVIL,MOTRIN) 200 MG tablet, Take 400 mg by mouth every 6 (six) hours as needed., Disp: , Rfl:  .  loratadine (CLARITIN) 10 MG tablet, Take 10 mg by mouth daily., Disp: , Rfl:  .  omeprazole (PRILOSEC) 20 MG capsule, Take 40 mg by mouth daily. , Disp: , Rfl:  .  benzonatate (TESSALON) 200 MG capsule, Take 1 capsule (200 mg total) by mouth 3 (three)  times daily as needed for cough., Disp: 30 capsule, Rfl: 0 .  chlorpheniramine-HYDROcodone (TUSSIONEX PENNKINETIC ER) 10-8 MG/5ML SUER, Take 5 mLs by mouth every 12 (twelve) hours as needed for cough., Disp: 120 mL, Rfl: 0 .  dexamethasone (DECADRON) 4 MG tablet, 4 tabs (16 mg) po at once tomorrow, Disp: 4 tablet, Rfl: 0 .  fluticasone (FLONASE) 50 MCG/ACT nasal spray, Place 2 sprays into both nostrils daily., Disp: 16 g, Rfl: 0 .  meloxicam (MOBIC) 15 MG tablet, Take 1 tablet (15 mg total) by mouth daily., Disp: 30 tablet, Rfl: 0  Allergies   Allergen Reactions  . Codeine Nausea And Vomiting    Can't take it in pill form     ROS  As noted in HPI.   Physical Exam  BP (!) 151/73 (BP Location: Left Arm)   Pulse 89   Temp 98.1 F (36.7 C) (Oral)   Resp 18   Ht 5' 7.5" (1.715 m)   Wt (!) 305 lb (138.3 kg)   LMP 07/18/2000 (Approximate)   SpO2 97%   BMI 47.06 kg/m   Constitutional: Well developed, well nourished, no acute distress Eyes: PERRL, EOMI, conjunctiva normal bilaterally HENT: Normocephalic, atraumatic,mucus membranes moist. Positive clear rhinorrhea, no sinus tenderness. Normal oropharynx. Positive cobblestoning and postnasal drip Respiratory: Fair air movement, diffuse bilateral wheezing throughout all lung fields, no rales, rhonchi. positive diffuse chest wall tenderness  Cardiovascular: Normal rate and rhythm, no murmurs, no gallops, no rubs GI: nondistended,  skin: No rash, skin intact Musculoskeletal: Calves symmetric, nontender, No edema, no tenderness, no deformities Neurologic: Alert & oriented x 3, CN II-XII grossly intact, no motor deficits, sensation grossly intact Psychiatric: Speech and behavior appropriate   ED Course   Medications  ipratropium-albuterol (DUONEB) 0.5-2.5 (3) MG/3ML nebulizer solution 3 mL (3 mLs Nebulization Given 03/03/16 1338)  dexamethasone (DECADRON) injection 12 mg (12 mg Other Given 03/03/16 1338)    No orders of the defined types were placed in this encounter.  No results found for this or any previous visit (from the past 24 hour(s)). No results found.  ED Clinical Impression  Upper respiratory tract infection, unspecified type  Mild asthma with exacerbation, unspecified whether persistent   ED Assessment/Plan  Presentation most consistent with an asthma exacerbation secondary to a URI. This may be early bronchitis, however we'll treat this with steroids and regular albuterol/bronchodilators. She states that she has a spacer at home and does not need more  albuterol. Giving a DuoNeb and 12 mg of dexamethasone by mouth 1 here. We'll reevaluate.  Reevaluation, patient states she feels significantly better. She has loud wheezing throughout all lung fields. She is ready to go home.  No antibiotics given absence of focal lung findings, fevers. We'll  defer this to her pulmonologist. plan to send home with 16 mg of oral dexamethasone tomorrow 1. Patient has opted for 2 days of steroids rather than 5 days of prednisone. She states that she does need more Flonase. also home with Tussionex,, Tessalon Perles advised  regular albuterol 2 puffs with her spacer as she already has every 4-6 hours as needed for coughing, wheezing, shortness of breath, saline nasal irrigation and to continue Mucinex D.  follow-up with her pulmonologist tomorrow. To the ER if she gets worse.   Discussed  MDM, plan and followup with patient. Discussed sn/sx that should prompt return to the ED. Patient agrees with plan.   Meds ordered this encounter  Medications  . ipratropium-albuterol (DUONEB) 0.5-2.5 (3) MG/3ML nebulizer  solution 3 mL  . dexamethasone (DECADRON) injection 12 mg  . fluticasone (FLONASE) 50 MCG/ACT nasal spray    Sig: Place 2 sprays into both nostrils daily.    Dispense:  16 g    Refill:  0  . chlorpheniramine-HYDROcodone (TUSSIONEX PENNKINETIC ER) 10-8 MG/5ML SUER    Sig: Take 5 mLs by mouth every 12 (twelve) hours as needed for cough.    Dispense:  120 mL    Refill:  0  . dexamethasone (DECADRON) 4 MG tablet    Sig: 4 tabs (16 mg) po at once tomorrow    Dispense:  4 tablet    Refill:  0  . benzonatate (TESSALON) 200 MG capsule    Sig: Take 1 capsule (200 mg total) by mouth 3 (three) times daily as needed for cough.    Dispense:  30 capsule    Refill:  0    *This clinic note was created using Scientist, clinical (histocompatibility and immunogenetics)Dragon dictation software. Therefore, there may be occasional mistakes despite careful proofreading.  ?   Domenick GongAshley Averlee Swartz, MD 03/03/16 1730

## 2016-03-05 ENCOUNTER — Telehealth: Payer: Self-pay | Admitting: *Deleted

## 2016-03-05 NOTE — Telephone Encounter (Signed)
Pt called stating that she went to [facility] on 03/03/16 and was given an injection of Decadron and then was given 4 tabs of Decadron to take at home, Tussionex and tessalon pearles. Pt has prod cough with yellow mucus and is very hoarse and has drainage. Denies any fever. Pt does take Breo daily. Please advise.

## 2016-03-07 NOTE — Telephone Encounter (Signed)
Continue those medications, call back next week with progress.

## 2016-03-08 NOTE — Telephone Encounter (Signed)
Spoke with pt and gave her MD's response. Pt states she received a shot of Decadron and 4 tabs of Decadron to take the next day. States she just finished the cough syrup. Pt is using the tessalon pearles and Mucinex D. Pt states she is still having wheezing with prod cough w/yellow/green mucus and drainage. Offered to get pt an appt for [facility] for today but pt denied. Scheduled pt for an appt on Monday 03/11/16 @ 2:15pm. Informed pt if she got worse between know and then to either go back to UC or to the ER. Pt verbalized understanding.

## 2016-03-10 NOTE — Progress Notes (Signed)
[facility]     Assessment and Plan:  Chronic asthma with Acute asthmatic bronchitis. --Will start albuterol nebulizer to use tid until symptoms improve.  -We will give 12 day course of prednisone taper.  Cough --Continue cough medicine as needed.   Date: 03/10/2016  MRN# [id number] Kimberly PatterRobin Machial Antonini 1960/03/29   Kimberly Vasquez is a 56 y.o. old female seen in follow up for chief complaint of  Chief Complaint  Patient presents with  . Acute Visit    prod cough; SOB; hoarseness: chest tightness: wheezing: sinus drainage; sinus pressure     HPI:   The patient is a previous patient of Dr. Meredeth IdeFleming, then Dr. Dema SeverinMungal. She has a history of known allergies, and has been diagnosed with asthmatic bronchitis. She has been maintained on advair.  She had a head cold about 2 weeks ago, then she went to urgent care about a week ago due to acute cough, sinus congestion, wheezing[facility] and neb treatment, and a dose of decadron to take the next day.   She also got tussin which helps with the cough. She has taken tessalon but they did not help.  She thinks that the decadron helped.  She has a dog sleeping in bed with her.      Medication:   Outpatient Encounter Prescriptions as of 03/11/2016  Medication Sig  . albuterol (PROVENTIL HFA;VENTOLIN HFA) 108 (90 BASE) MCG/ACT inhaler Inhale 2 puffs into the lungs every 6 (six) hours as needed.  . benzonatate (TESSALON) 200 MG capsule Take 1 capsule (200 mg total) by mouth 3 (three) times daily as needed for cough.  . chlorpheniramine-HYDROcodone (TUSSIONEX PENNKINETIC ER) 10-8 MG/5ML SUER Take 5 mLs by mouth every 12 (twelve) hours as needed for cough.  . dexamethasone (DECADRON) 4 MG tablet 4 tabs (16 mg) po at once tomorrow  . dimenhyDRINATE (DRAMAMINE) 50 MG tablet Take 50 mg by mouth every 8 (eight) hours as needed.  . fluticasone (FLONASE) 50 MCG/ACT nasal spray Place 2 sprays into  both nostrils daily.  . fluticasone furoate-vilanterol (BREO ELLIPTA) 200-25 MCG/INH AEPB Inhale 1 puff into the lungs daily.  Marland Kitchen. ibuprofen (ADVIL,MOTRIN) 200 MG tablet Take 400 mg by mouth every 6 (six) hours as needed.  . loratadine (CLARITIN) 10 MG tablet Take 10 mg by mouth daily.  . meloxicam (MOBIC) 15 MG tablet Take 1 tablet (15 mg total) by mouth daily.  Marland Kitchen. omeprazole (PRILOSEC) 20 MG capsule Take 40 mg by mouth daily.    No facility-administered encounter medications on file as of 03/11/2016.      Allergies:  Codeine  Review of Systems: Gen:  Denies  fever, sweats. HEENT: Denies blurred vision. Cvc:  No dizziness, chest pain or heaviness Resp:  Notes significant cough and sputum production. Gi: Denies swallowing difficulty, stomach pain. constipation, bowel incontinence Gu:  Denies bladder incontinence, burning urine Ext:   No Joint pain, stiffness. Skin: No skin rash, easy bruising. Endoc:  No polyuria, polydipsia. Psych: No depression, insomnia. Other:  All other systems were reviewed and found to be negative other than what is mentioned in the HPI.   Physical Examination:   VS: BP 138/88 (BP Location: Left Arm, Cuff Size: Normal)   Pulse 98   Wt (!) 302 lb (137 kg)   LMP 07/18/2000 (Approximate)   SpO2 95%   BMI 46.60 kg/m   General Appearance: No distress  Neuro:without focal findings,  speech normal,  HEENT: PERRLA, EOM intact. Pulmonary: normal breath sounds, No wheezing.   CardiovascularNormal S1,S2.  No m/r/g.   Abdomen: Benign, Soft, non-tender. Renal:  No costovertebral tenderness  GU:  Not performed at this time. Endoc: No evident thyromegaly, no signs of acromegaly. Skin:   warm, no rash. Extremities: normal, no cyanosis, clubbing.   LABORATORY PANEL:   CBC No results for input(s): WBC, HGB, HCT, PLT in the last 168  hours. ------------------------------------------------------------------------------------------------------------------  Chemistries  No results for input(s): NA, K, CL, CO2, GLUCOSE, BUN, CREATININE, CALCIUM, MG, AST, ALT, ALKPHOS, BILITOT in the last 168 hours.  Invalid input(s): GFRCGP ------------------------------------------------------------------------------------------------------------------  Cardiac Enzymes No results for input(s): TROPONINI in the last 168 hours. ------------------------------------------------------------  RADIOLOGY:   No results found for this or any previous visit. No results found for this or any previous visit. ------------------------------------------------------------------------------------------------------------------  Thank  you for allowing [facility] to assist in the care of your patient. Our recommendations are noted above.  Please contact us if we can be of further service.   Wells Guiles, MD.  [facility] Office Number: [phone]  Santiago Glad, M.D.  Stephanie Acre, M.D.  Billy Fischer, M.D  03/10/2016

## 2016-03-11 ENCOUNTER — Encounter: Payer: Self-pay | Admitting: Internal Medicine

## 2016-03-11 ENCOUNTER — Ambulatory Visit (INDEPENDENT_AMBULATORY_CARE_PROVIDER_SITE_OTHER): Payer: Self-pay | Admitting: Internal Medicine

## 2016-03-11 VITALS — BP 138/88 | HR 98 | Wt 302.0 lb

## 2016-03-11 DIAGNOSIS — J4540 Moderate persistent asthma, uncomplicated: Secondary | ICD-10-CM

## 2016-03-11 MED ORDER — ALBUTEROL SULFATE (2.5 MG/3ML) 0.083% IN NEBU: 2.5000 mg | INHALATION_SOLUTION | Freq: Four times a day (QID) | RESPIRATORY_TRACT | 1 refills | Status: DC | PRN

## 2016-03-11 MED ORDER — PREDNISONE 10 MG PO TABS: ORAL_TABLET | ORAL | 0 refills | Status: DC

## 2016-03-11 NOTE — Patient Instructions (Addendum)
Prednisone 10 mg tabs x  42.  Take 6 tablets on day 1,2 Take 5 tablets on day 3,4 Take 4 tablets on day 5,6 Take 3 tablets on day 7,8 Take 2 tablets on day 9,10 Take 1 tablet on day 11,12 Then stop.   --Will prescribe a nebulizer machine with albuterol nebulizer 3 times daily until this episode is over.   --Pls make appointment to see Dr. Belia HemanKasa in 6 months.

## 2016-03-11 NOTE — Addendum Note (Signed)
Addended by: Meyer CoryAHMAD, MISTY R on: 03/11/2016 02:40 PM   Modules accepted: Orders

## 2016-04-15 ENCOUNTER — Telehealth: Payer: Self-pay | Admitting: Internal Medicine

## 2016-04-15 NOTE — Telephone Encounter (Signed)
Pt states she thinks she has the same viral "stuff" she has before. She states he has cough, and drainage, states she has a low grade fever, and achy, and feels horrible. Please call. Pt states she is not sure she has the flu.

## 2016-04-15 NOTE — Telephone Encounter (Signed)
lmtcb X1 for pt. Will await call back 

## 2016-04-16 NOTE — Telephone Encounter (Signed)
Pt was seen on 1/22 for asthma. Pt sounds very SOB and states she has started the nebulizer meds. Has drainage, HA and prod cough. Please advise.

## 2016-04-18 ENCOUNTER — Encounter: Payer: Self-pay | Admitting: *Deleted

## 2016-04-18 ENCOUNTER — Ambulatory Visit
Admission: EM | Admit: 2016-04-18 | Discharge: 2016-04-18 | Disposition: A | Discharge status: Home / Self Care | Payer: Self-pay | Attending: Family Medicine | Admitting: Family Medicine

## 2016-04-18 DIAGNOSIS — J209 Acute bronchitis, unspecified: Secondary | ICD-10-CM

## 2016-04-18 MED ORDER — HYDROCOD POLST-CPM POLST ER 10-8 MG/5ML PO SUER: 5.0000 mL | Freq: Two times a day (BID) | ORAL | 0 refills | Status: DC | PRN

## 2016-04-18 MED ORDER — AMOXICILLIN-POT CLAVULANATE 875-125 MG PO TABS: 1.0000 | ORAL_TABLET | Freq: Two times a day (BID) | ORAL | 0 refills | Status: DC

## 2016-04-18 MED ORDER — PREDNISONE 10 MG (21) PO TBPK: ORAL_TABLET | ORAL | 0 refills | Status: DC

## 2016-04-18 MED ORDER — ALBUTEROL SULFATE (2.5 MG/3ML) 0.083% IN NEBU: 2.5000 mg | INHALATION_SOLUTION | Freq: Four times a day (QID) | RESPIRATORY_TRACT | 0 refills | Status: DC | PRN

## 2016-04-18 NOTE — ED Triage Notes (Signed)
Patient started having symptoms of cough, nasal congestion, fever, and head pressure. Patient has chronic pulmonary issues.

## 2016-04-18 NOTE — ED Provider Notes (Signed)
[facility]    CSN: [id number] Arrival date & time: 04/18/16  4098     History   Chief Complaint Chief Complaint  Patient presents with  . Cough  . Otalgia  . Fever  . Nasal Congestion    HPI Kimberly Vasquez is a 56 y.o. female.   Patient is a 56 year old white female who presents with a history of having a GI bug last week. It got better but then within about 2-3 days she had another relapse even now she still. Nauseous even though the acute nausea vomiting diarrhea both times only lasted for about 24 hours. In between the 2 times of this flu bug last week she started coughing having chest congestion as well. She called her pulmonologist on Monday was unable to reach some talk to the nurse on Tuesday who stated that she they would get back in touch with her and keep using her nebulizers. Premature another nebulizer albuterol treatment and shortness of breath has gotten worse and she came in to be seen. She reports nasal congestion coughing and runny nose as well. As well as the wheezing that she's having. She reports having shortness of breath. Stop smoking in 2000. Her father's had a stroke. She is only allergic to codeine tablets she can take in liquid form. She has a history of asthma or diabetes GERD hyperlipidemia obesity osteopenia vitamin D deficiency.    The history is provided by the patient. No language interpreter was used.  Cough  Cough characteristics:  Productive and non-productive (Where she is coughing is nonproductive but when she blows her nose is a tinge of yellow) Sputum characteristics:  Yellow Severity:  Moderate Onset quality:  Sudden Timing:  Constant Progression:  Worsening Chronicity:  New Smoker: no   Context: upper respiratory infection   Relieved by:  Nothing Worsened by:  Deep breathing Ineffective treatments:  Beta-agonist inhaler Associated symptoms: ear pain, fever, rhinorrhea, shortness of breath and wheezing   Otalgia    Associated symptoms: cough, fever and rhinorrhea   Fever  Associated symptoms: cough, ear pain, nausea and rhinorrhea     Past Medical History:  Diagnosis Date  . Allergic rhinitis   . Asthma without status asthmaticus   . Diabetes mellitus without complication (HCC)   . GERD (gastroesophageal reflux disease)   . Hyperlipidemia   . IGT (impaired glucose tolerance)   . Obesity   . Osteopenia   . Vitamin D deficiency     Patient Active Problem List   Diagnosis Date Noted  . Asthma without status asthmaticus 11/16/2015  . IGT (impaired glucose tolerance) 11/16/2015  . URI (upper respiratory infection) 04/19/2015  . Morbid obesity (HCC) 08/15/2014  . Extrinsic asthma 06/14/2014    Past Surgical History:  Procedure Laterality Date  . ABDOMINAL HYSTERECTOMY  2001  . BREAST BIOPSY Bilateral    x2 1979/1993  . ESOPHAGOGASTRODUODENOSCOPY (EGD) WITH PROPOFOL N/A 01/02/2016   Procedure: ESOPHAGOGASTRODUODENOSCOPY (EGD) WITH PROPOFOL;  Surgeon: Midge Minium, MD;  Location: [facility] ENDOSCOPY;  Service: Endoscopy;  Laterality: N/A;  . GALLBLADDER SURGERY      OB History    No data available       Home Medications    Prior to Admission medications   Medication Sig Start Date End Date Taking? Authorizing Provider  albuterol (PROVENTIL HFA;VENTOLIN HFA) 108 (90 BASE) MCG/ACT inhaler Inhale 2 puffs into the lungs every 6 (six) hours as needed. 02/07/14  Yes Historical Provider, MD  albuterol (PROVENTIL) (2.5 MG/3ML) 0.083%  nebulizer solution Take 3 mLs (2.5 mg total) by nebulization every 6 (six) hours as needed for wheezing or shortness of breath. 03/11/16  Yes Shane Crutch, MD  chlorpheniramine-HYDROcodone (TUSSIONEX PENNKINETIC ER) 10-8 MG/5ML SUER Take 5 mLs by mouth every 12 (twelve) hours as needed for cough. 03/03/16  Yes Domenick Gong, MD  dimenhyDRINATE (DRAMAMINE) 50 MG tablet Take 50 mg by mouth every 8 (eight) hours as needed.   Yes Historical Provider, MD   fluticasone (FLONASE) 50 MCG/ACT nasal spray Place 2 sprays into both nostrils daily. 03/03/16  Yes Domenick Gong, MD  fluticasone furoate-vilanterol (BREO ELLIPTA) 200-25 MCG/INH AEPB Inhale 1 puff into the lungs daily. 12/22/15  Yes Erin Fulling, MD  ibuprofen (ADVIL,MOTRIN) 200 MG tablet Take 400 mg by mouth every 6 (six) hours as needed.   Yes Historical Provider, MD  loratadine (CLARITIN) 10 MG tablet Take 10 mg by mouth daily.   Yes Historical Provider, MD  omeprazole (PRILOSEC) 20 MG capsule Take 40 mg by mouth daily.    Yes Historical Provider, MD  albuterol (PROVENTIL) (2.5 MG/3ML) 0.083% nebulizer solution Take 3 mLs (2.5 mg total) by nebulization every 6 (six) hours as needed for wheezing or shortness of breath. 04/18/16   Hassan Rowan, MD  amoxicillin-clavulanate (AUGMENTIN) 875-125 MG tablet Take 1 tablet by mouth 2 (two) times daily. 04/18/16   Hassan Rowan, MD  benzonatate (TESSALON) 200 MG capsule Take 1 capsule (200 mg total) by mouth 3 (three) times daily as needed for cough. 03/03/16   Domenick Gong, MD  chlorpheniramine-HYDROcodone [facility] ER) 10-8 MG/5ML SUER Take 5 mLs by mouth every 12 (twelve) hours as needed. 04/18/16   Hassan Rowan, MD  meloxicam (MOBIC) 15 MG tablet Take 1 tablet (15 mg total) by mouth daily. 01/25/16   Hassan Rowan, MD  predniSONE (DELTASONE) 10 MG tablet Take 6 tablets on day 1,2 Take 5 tablets on day 3,4 Take 4 tablets on day 5,6 Take 3 tablets on day 7,8 Take 2 tablets on day 9,10 Take 1 tablet on day 11,12 Then stop. 03/11/16   Shane Crutch, MD  predniSONE (STERAPRED UNI-PAK 21 TAB) 10 MG (21) TBPK tablet Sig 6 tablet day 1, 5 tablets day 2, 4 tablets day 3,,3tablets day 4, 2 tablets day 5, 1 tablet day 6 take all tablets orally 04/18/16   Hassan Rowan, MD    Family History Family History  Problem Relation Age of Onset  . Heart disease Mother   . Asthma Mother   . Diabetes Mother   . Stroke Father   . Breast cancer Paternal  Grandmother 54    Social History Social History  Substance Use Topics  . Smoking status: Former Smoker    Packs/day: 0.50    Years: 8.00    Types: Cigarettes  . Smokeless tobacco: Never Used     Comment: quit smoking in 2000  . Alcohol use 0.0 oz/week     Comment: occasional glass of wine, last time friday     Allergies   Codeine   Review of Systems Review of Systems  Constitutional: Positive for fever.  HENT: Positive for ear pain, postnasal drip, rhinorrhea and sinus pressure.   Respiratory: Positive for cough, shortness of breath and wheezing.   Gastrointestinal: Positive for nausea.  All other systems reviewed and are negative.    Physical Exam Triage Vital Signs ED Triage Vitals  Enc Vitals Group     BP 04/18/16 0951 (!) 144/82     Pulse Rate 04/18/16 0951  94     Resp 04/18/16 0951 18     Temp 04/18/16 0951 98.1 F (36.7 C)     Temp Source 04/18/16 0951 Oral     SpO2 04/18/16 0951 94 %     Weight --      Height --      Head Circumference --      Peak Flow --      Pain Score 04/18/16 0957 0     Pain Loc --      Pain Edu? --      Excl. in GC? --    No data found.   Updated Vital Signs BP (!) 144/82 (BP Location: Right Wrist)   Pulse 94   Temp 98.1 F (36.7 C) (Oral)   Resp 18   LMP 07/18/2000 (Approximate)   SpO2 94%   Visual Acuity Right Eye Distance:   Left Eye Distance:   Bilateral Distance:    Right Eye Near:   Left Eye Near:    Bilateral Near:     Physical Exam  Constitutional: She is oriented to person, place, and time. She appears well-developed and well-nourished.  HENT:  Head: Normocephalic and atraumatic.  Right Ear: Hearing, tympanic membrane, external ear and ear canal normal.  Left Ear: Hearing, tympanic membrane, external ear and ear canal normal.  Nose: Mucosal edema and rhinorrhea present. Right sinus exhibits no maxillary sinus tenderness and no frontal sinus tenderness. Left sinus exhibits no maxillary sinus tenderness  and no frontal sinus tenderness.  Mouth/Throat: Uvula is midline, oropharynx is clear and moist and mucous membranes are normal.  Eyes: Conjunctivae and EOM are normal. Pupils are equal, round, and reactive to light.  Neck: Normal range of motion. Neck supple.  Cardiovascular: Normal rate and regular rhythm.   Pulmonary/Chest: Effort normal. No apnea. No respiratory distress. She has decreased breath sounds. She has wheezes.  Musculoskeletal: Normal range of motion.  Lymphadenopathy:    She has no cervical adenopathy.  Neurological: She is alert and oriented to person, place, and time.  Skin: Skin is warm.  Psychiatric: She has a normal mood and affect.  Vitals reviewed.    UC Treatments / Results  Labs (all labs ordered are listed, but only abnormal results are displayed) Labs Reviewed - No data to display  EKG  EKG Interpretation None       Radiology No results found.  Procedures Procedures (including critical care time)  Medications Ordered in UC Medications - No data to display   Initial Impression / Assessment and Plan / UC Course  I have reviewed the triage vital signs and the nursing notes.  Pertinent labs & imaging results that were available during my care of the patient were reviewed by me and considered in my medical decision making (see chart for details).   patient also requested refill of her albuterol inhalers for nebulizer treatment. Will give her that place on Tussionex 1 teaspoon once twice a day as needed Augmentin 875 one tablet twice a day and 60 course of prednisone as well. She declined work note.  Final Clinical Impressions(s) / UC Diagnoses   Final diagnoses:  Acute bronchitis with bronchospasm    New Prescriptions Discharge Medication List as of 04/18/2016 10:41 AM    START taking these medications   Details  !! albuterol (PROVENTIL) (2.5 MG/3ML) 0.083% nebulizer solution Take 3 mLs (2.5 mg total) by nebulization every 6 (six) hours as  needed for wheezing or shortness of breath., Starting Thu 04/18/2016,  Normal    amoxicillin-clavulanate (AUGMENTIN) 875-125 MG tablet Take 1 tablet by mouth 2 (two) times daily., Starting Thu 04/18/2016, Normal    !! chlorpheniramine-HYDROcodone (TUSSIONEX PENNKINETIC ER) 10-8 MG/5ML SUER Take 5 mLs by mouth every 12 (twelve) hours as needed., Starting Thu 04/18/2016, Normal    predniSONE (STERAPRED UNI-PAK 21 TAB) 10 MG (21) TBPK tablet Sig 6 tablet day 1, 5 tablets day 2, 4 tablets day 3,,3tablets day 4, 2 tablets day 5, 1 tablet day 6 take all tablets orally, Normal     !! - Potential duplicate medications found. Please discuss with provider.        Note: This dictation was prepared with Dragon dictation along with smaller phrase technology. Any transcriptional errors that result from this process are unintentional.   Hassan Rowan, MD 04/18/16 1050

## 2016-06-09 ENCOUNTER — Ambulatory Visit
Admission: EM | Admit: 2016-06-09 | Discharge: 2016-06-09 | Disposition: A | Discharge status: Home / Self Care | Payer: Self-pay | Attending: Family Medicine | Admitting: Family Medicine

## 2016-06-09 ENCOUNTER — Encounter: Payer: Self-pay | Admitting: Gynecology

## 2016-06-09 DIAGNOSIS — R05 Cough: Secondary | ICD-10-CM

## 2016-06-09 DIAGNOSIS — R0602 Shortness of breath: Secondary | ICD-10-CM

## 2016-06-09 DIAGNOSIS — J4541 Moderate persistent asthma with (acute) exacerbation: Secondary | ICD-10-CM

## 2016-06-09 MED ORDER — PREDNISONE 20 MG PO TABS
ORAL_TABLET | ORAL | 0 refills | Status: DC
Start: 2016-06-09 — End: 2016-11-05

## 2016-06-09 MED ORDER — DOXYCYCLINE HYCLATE 100 MG PO TABS: 100.0000 mg | ORAL_TABLET | Freq: Two times a day (BID) | ORAL | 0 refills | Status: DC

## 2016-06-09 MED ORDER — IPRATROPIUM-ALBUTEROL 0.5-2.5 (3) MG/3ML IN SOLN
3.0000 mL | Freq: Once | RESPIRATORY_TRACT | Status: AC
Start: 2016-06-09 — End: 2016-06-09
  Administered 2016-06-09: 3 mL via RESPIRATORY_TRACT

## 2016-06-09 MED ORDER — ALBUTEROL SULFATE (2.5 MG/3ML) 0.083% IN NEBU: 2.5000 mg | INHALATION_SOLUTION | Freq: Four times a day (QID) | RESPIRATORY_TRACT | 0 refills | Status: DC | PRN

## 2016-06-09 NOTE — ED Provider Notes (Signed)
[facility]    CSN: [id number] Arrival date & time: 06/09/16  1004     History   Chief Complaint Chief Complaint  Patient presents with  . Cough    HPI Kimberly Vasquez is a 56 y.o. female.   The history is provided by the patient.  Wheezing  Severity:  Moderate Severity compared to prior episodes:  Similar Onset quality:  Sudden Duration:  5 days Timing:  Constant Progression:  Worsening Chronicity:  Chronic Context: emotional upset, exposure to allergen and pollens   Relieved by:  Nothing Ineffective treatments:  Home nebulizer and steroid inhaler Associated symptoms: cough, rhinorrhea, shortness of breath and sputum production   Associated symptoms: no chest pain, no chest tightness, no ear pain, no fatigue, no fever, no foot swelling, no headaches, no orthopnea, no PND, no rash, no sore throat, no stridor and no swollen glands   Risk factors: not exposed to toxic fumes, no prior ICU admissions, no prior intubations, no smoke inhalation and no suspected foreign body     Past Medical History:  Diagnosis Date  . Allergic rhinitis   . Asthma without status asthmaticus   . Diabetes mellitus without complication (HCC)   . GERD (gastroesophageal reflux disease)   . Hyperlipidemia   . IGT (impaired glucose tolerance)   . Obesity   . Osteopenia   . Vitamin D deficiency     Patient Active Problem List   Diagnosis Date Noted  . Asthma without status asthmaticus 11/16/2015  . IGT (impaired glucose tolerance) 11/16/2015  . URI (upper respiratory infection) 04/19/2015  . Morbid obesity (HCC) 08/15/2014  . Extrinsic asthma 06/14/2014    Past Surgical History:  Procedure Laterality Date  . ABDOMINAL HYSTERECTOMY  2001  . BREAST BIOPSY Bilateral    x2 1979/1993  . ESOPHAGOGASTRODUODENOSCOPY (EGD) WITH PROPOFOL N/A 01/02/2016   Procedure: ESOPHAGOGASTRODUODENOSCOPY (EGD) WITH PROPOFOL;  Surgeon: Midge Minium, MD;  Location: [facility] ENDOSCOPY;  Service:  Endoscopy;  Laterality: N/A;  . GALLBLADDER SURGERY      OB History    No data available       Home Medications    Prior to Admission medications   Medication Sig Start Date End Date Taking? Authorizing Provider  dimenhyDRINATE (DRAMAMINE) 50 MG tablet Take 50 mg by mouth every 8 (eight) hours as needed.   Yes Historical Provider, MD  fluticasone (FLONASE) 50 MCG/ACT nasal spray Place 2 sprays into both nostrils daily. 03/03/16  Yes Domenick Gong, MD  fluticasone furoate-vilanterol (BREO ELLIPTA) 200-25 MCG/INH AEPB Inhale 1 puff into the lungs daily. 12/22/15  Yes Erin Fulling, MD  ibuprofen (ADVIL,MOTRIN) 200 MG tablet Take 400 mg by mouth every 6 (six) hours as needed.   Yes Historical Provider, MD  loratadine (CLARITIN) 10 MG tablet Take 10 mg by mouth daily.   Yes Historical Provider, MD  meloxicam (MOBIC) 15 MG tablet Take 1 tablet (15 mg total) by mouth daily. 01/25/16  Yes Hassan Rowan, MD  omeprazole (PRILOSEC) 20 MG capsule Take 40 mg by mouth daily.    Yes Historical Provider, MD  albuterol (PROVENTIL) (2.5 MG/3ML) 0.083% nebulizer solution Take 3 mLs (2.5 mg total) by nebulization every 6 (six) hours as needed for wheezing or shortness of breath. 06/09/16   Payton Mccallum, MD  amoxicillin-clavulanate (AUGMENTIN) 875-125 MG tablet Take 1 tablet by mouth 2 (two) times daily. 04/18/16   Hassan Rowan, MD  benzonatate (TESSALON) 200 MG capsule Take 1 capsule (200 mg total) by mouth 3 (three) times  daily as needed for cough. 03/03/16   Domenick Gong, MD  chlorpheniramine-HYDROcodone [facility] ER) 10-8 MG/5ML SUER Take 5 mLs by mouth every 12 (twelve) hours as needed for cough. 03/03/16   Domenick Gong, MD  chlorpheniramine-HYDROcodone [facility] ER) 10-8 MG/5ML SUER Take 5 mLs by mouth every 12 (twelve) hours as needed. 04/18/16   Hassan Rowan, MD  doxycycline (VIBRA-TABS) 100 MG tablet Take 1 tablet (100 mg total) by mouth 2 (two) times daily. 06/09/16   Payton Mccallum, MD  predniSONE (DELTASONE) 20 MG tablet 2  tabs po qd for 2 days, 1 tab po qd for 3 days, then half a tab po qd for 3 days 06/09/16   Payton Mccallum, MD    Family History Family History  Problem Relation Age of Onset  . Heart disease Mother   . Asthma Mother   . Diabetes Mother   . Stroke Father   . Breast cancer Paternal Grandmother 53    Social History Social History  Substance Use Topics  . Smoking status: Former Smoker    Packs/day: 0.50    Years: 8.00    Types: Cigarettes  . Smokeless tobacco: Never Used     Comment: quit smoking in 2000  . Alcohol use 0.0 oz/week     Comment: occasional glass of wine, last time friday     Allergies   Codeine   Review of Systems Review of Systems  Constitutional: Negative for fatigue and fever.  HENT: Positive for rhinorrhea. Negative for ear pain and sore throat.   Respiratory: Positive for cough, sputum production, shortness of breath and wheezing. Negative for chest tightness and stridor.   Cardiovascular: Negative for chest pain, orthopnea and PND.  Skin: Negative for rash.  Neurological: Negative for headaches.     Physical Exam Triage Vital Signs ED Triage Vitals  Enc Vitals Group     BP 06/09/16 1026 130/88     Pulse Rate 06/09/16 1026 95     Resp 06/09/16 1026 18     Temp 06/09/16 1026 98.1 F (36.7 C)     Temp Source 06/09/16 1026 Oral     SpO2 06/09/16 1026 94 %     Weight 06/09/16 1025 (!) 305 lb (138.3 kg)     Height 06/09/16 1025  (1.702 m)     Head Circumference --      Peak Flow --      Pain Score 06/09/16 1025 7     Pain Loc --      Pain Edu? --      Excl. in GC? --    No data found.   Updated Vital Signs BP 130/88 (BP Location: Left Arm)   Pulse 90   Temp 98.1 F (36.7 C) (Oral)   Resp 18   Ht  (1.702 m)   Wt (!) 305 lb (138.3 kg)   LMP 07/18/2000 (Approximate)   SpO2 98%   BMI 47.77 kg/m   Visual Acuity Right Eye Distance:   Left Eye Distance:   Bilateral  Distance:    Right Eye Near:   Left Eye Near:    Bilateral Near:     Physical Exam  Constitutional: She appears well-developed and well-nourished. No distress.  HENT:  Head: Normocephalic and atraumatic.  Right Ear: Tympanic membrane, external ear and ear canal normal.  Left Ear: Tympanic membrane, external ear and ear canal normal.  Nose: No mucosal edema, rhinorrhea, nose lacerations, sinus tenderness, nasal deformity, septal deviation or  nasal septal hematoma. No epistaxis.  No foreign bodies. Right sinus exhibits no maxillary sinus tenderness and no frontal sinus tenderness. Left sinus exhibits no maxillary sinus tenderness and no frontal sinus tenderness.  Mouth/Throat: Uvula is midline, oropharynx is clear and moist and mucous membranes are normal. No oropharyngeal exudate.  Eyes: Conjunctivae and EOM are normal. Pupils are equal, round, and reactive to light. Right eye exhibits no discharge. Left eye exhibits no discharge. No scleral icterus.  Neck: Normal range of motion. Neck supple. No thyromegaly present.  Cardiovascular: Normal rate, regular rhythm and normal heart sounds.   Pulmonary/Chest: Effort normal. No respiratory distress. She has wheezes (and diffuse rhonchi). She has no rales.  Lymphadenopathy:    She has no cervical adenopathy.  Skin: She is not diaphoretic.  Nursing note and vitals reviewed.    UC Treatments / Results  Labs (all labs ordered are listed, but only abnormal results are displayed) Labs Reviewed - No data to display  EKG  EKG Interpretation None       Radiology No results found.  Procedures Procedures (including critical care time)  Medications Ordered in UC Medications  ipratropium-albuterol (DUONEB) 0.5-2.5 (3) MG/3ML nebulizer solution 3 mL (3 mLs Nebulization Given 06/09/16 1110)     Initial Impression / Assessment and Plan / UC Course  I have reviewed the triage vital signs and the nursing notes.  Pertinent labs & imaging  results that were available during my care of the patient were reviewed by me and considered in my medical decision making (see chart for details).       Final Clinical Impressions(s) / UC Diagnoses   Final diagnoses:  Moderate persistent asthma with exacerbation    New Prescriptions New Prescriptions   ALBUTEROL (PROVENTIL) (2.5 MG/3ML) 0.083% NEBULIZER SOLUTION    Take 3 mLs (2.5 mg total) by nebulization every 6 (six) hours as needed for wheezing or shortness of breath.   DOXYCYCLINE (VIBRA-TABS) 100 MG TABLET    Take 1 tablet (100 mg total) by mouth 2 (two) times daily.   PREDNISONE (DELTASONE) 20 MG TABLET    2  tabs po qd for 2 days, 1 tab po qd for 3 days, then half a tab po qd for 3 days   1.diagnosis reviewed with patient 2. Patient given duoneb tx with improvement of symptoms 3. rx as per orders above; reviewed possible side effects, interactions, risks and benefits  4. Follow-up prn if symptoms worsen or don't improve   Payton Mccallum, MD 06/09/16 1132

## 2016-06-09 NOTE — ED Triage Notes (Signed)
Per patient recurrent bronchitis . Patient was seen x 1 month ago for same symptoms. Cough/congestion. Per patient unable to get an appointment with her pulmonary doctor.

## 2016-07-03 ENCOUNTER — Ambulatory Visit: Payer: Self-pay

## 2016-07-24 ENCOUNTER — Ambulatory Visit
Admission: RE | Admit: 2016-07-24 | Discharge: 2016-07-24 | Disposition: A | Discharge status: Home / Self Care | Payer: Self-pay | Source: Ambulatory Visit | Attending: Oncology | Admitting: Oncology

## 2016-07-24 ENCOUNTER — Ambulatory Visit: Payer: Self-pay | Attending: Oncology

## 2016-07-24 VITALS — BP 127/82 | HR 87 | Temp 97.2°F | Resp 18 | Ht 67.0 in | Wt 298.0 lb

## 2016-07-24 DIAGNOSIS — Z0000 Encounter for general adult medical examination without abnormal findings: Secondary | ICD-10-CM

## 2016-07-24 NOTE — Progress Notes (Addendum)
Subjective:     Patient ID: Adair PatterRobin Machial Leiker, female   DOB: 06-May-1960, [age] y.o.   MRN: [id number]  HPI   Review of Systems     Objective:   Physical Exam  Pulmonary/Chest: Right breast exhibits no inverted nipple, no mass, no nipple discharge, no skin change and no tenderness. Left breast exhibits no inverted nipple, no mass, no nipple discharge, no skin change and no tenderness. Breasts are symmetrical.       Assessment:     56 [facility] Patient screened, and meets [facility] eligibility.  Patient does not have insurance, Medicare or Medicaid.  Handout given on Affordable Care Act.  Instructed patient on breast self-exam using teach back method.  CBE unremarkable.  No mass or lump palpated. Pelvic exam normal.  History of hysterectomy.  Ovaries intact.    Plan:     Sent for bilateral screening mammogram.

## 2016-07-26 NOTE — Progress Notes (Unsigned)
Letter mailed from [facility] to notify of normal mammogram results.  Patient to return in one year for annual screening.  Copy to [facility]. 

## 2016-11-05 ENCOUNTER — Ambulatory Visit (INDEPENDENT_AMBULATORY_CARE_PROVIDER_SITE_OTHER): Payer: Self-pay | Admitting: Internal Medicine

## 2016-11-05 ENCOUNTER — Encounter: Payer: Self-pay | Admitting: Internal Medicine

## 2016-11-05 VITALS — BP 142/82 | HR 98 | Resp 16 | Ht 67.0 in | Wt 305.0 lb

## 2016-11-05 DIAGNOSIS — J4520 Mild intermittent asthma, uncomplicated: Secondary | ICD-10-CM

## 2016-11-05 MED ORDER — FLUTICASONE FUROATE-VILANTEROL 200-25 MCG/INH IN AEPB: 1.0000 | INHALATION_SPRAY | Freq: Every day | RESPIRATORY_TRACT | 3 refills | Status: DC

## 2016-11-05 MED ORDER — FLUTICASONE FUROATE-VILANTEROL 100-25 MCG/INH IN AEPB: 1.0000 | INHALATION_SPRAY | Freq: Every day | RESPIRATORY_TRACT | 3 refills | Status: DC

## 2016-11-05 NOTE — Progress Notes (Signed)
  [facility] Pulmonary Medicine Consultation      MRN# [id number] Kimberly Vasquez Jul 12, 1960   CC: Chief Complaint  Patient presents with  . Asthma    pt had bronchitis in the spring. Pt reports breathing is doing well otherwise. Pt is dropping of [employer] pt assistance forms for completion.      HPI   Patient doing well on Breo 200 daily Will decrease to Breo 100 Infrequent use of albuterol last several months No signs of infection at this time  Has non-refreshed sleep, excessive tiredness and daytime sleepiness and fatigue during the day, loud snoring reported by husband Weighs 300 LBS  Complaining of GERD, increased belching, gas pains  Patient weighs 305 pounds I have discussed the significance of this weight gain Patient cannot afford sleep study at this time Patient had gastric bypass assessment and follow-up and that is no longer a plan     Review of Systems  Constitutional: Positive for malaise/fatigue. Negative for chills, fever and weight loss.  HENT: Negative for congestion and sore throat.   Respiratory: Negative for cough, hemoptysis, sputum production, shortness of breath and wheezing.   Cardiovascular: Negative for chest pain, palpitations, orthopnea, claudication and leg swelling.  Gastrointestinal: Positive for abdominal pain and heartburn. Negative for diarrhea, nausea and vomiting.      Allergies:  Codeine  Physical Examination:  VS: BP (!) 142/82 (BP Location: Left Arm, Cuff Size: Normal)   Pulse 98   Resp 16   Ht  (1.702 m)   Wt (!) 305 lb (138.3 kg)   LMP 07/18/2000 (Approximate)   SpO2 (!) 85%   BMI 47.77 kg/m   General Appearance: No distress  HEENT: PERRLA, no ptosis, no other lesions noticed Pulmonary:normal breath sounds., diaphragmatic excursion normal.No wheezing, No rales   Cardiovascular:  Normal S1,S2.  No m/r/g.     No edema NEURO intact    Assessment and Plan:  56 yo morbidly obese white female  with  mild intermittent uncomplicated ASTHMA with underlying GERD and signs and symptoms OSA  1.change to  BREO 100 daily 2.albuterol as needed 3.follow up GI 4.continue PPI as prescribed 5.obtain split night sleep study when patient is ready 6.recommend watching diet, exercise, weight loss   Follow up  in 1 year    Patient satisfied with Plan of action and management. All questions answered  Lucie Leather, M.D.  [facility]  Medical Director [facility] Medical Director [facility] Cardio-Pulmonary Department

## 2016-11-05 NOTE — Patient Instructions (Signed)
Start BREO 100 daily

## 2016-11-05 NOTE — Addendum Note (Signed)
Addended by: Alease Frame on: 11/05/2016 11:12 AM   Modules accepted: Orders

## 2016-11-28 ENCOUNTER — Telehealth: Payer: Self-pay | Admitting: Internal Medicine

## 2016-11-28 MED ORDER — FLUTICASONE FUROATE-VILANTEROL 100-25 MCG/INH IN AEPB: 1.0000 | INHALATION_SPRAY | Freq: Every day | RESPIRATORY_TRACT | 3 refills | Status: DC

## 2016-11-28 NOTE — Telephone Encounter (Signed)
RX printed and placed in DK's folder to be signed.

## 2016-11-28 NOTE — Telephone Encounter (Signed)
Please fax cover letter with rx for Breo to [phone]

## 2016-12-02 NOTE — Telephone Encounter (Signed)
rx faxed. Nothing further needed.

## 2016-12-22 ENCOUNTER — Ambulatory Visit (INDEPENDENT_AMBULATORY_CARE_PROVIDER_SITE_OTHER): Payer: Self-pay

## 2016-12-22 ENCOUNTER — Encounter: Payer: Self-pay | Admitting: Gynecology

## 2016-12-22 ENCOUNTER — Ambulatory Visit
Admission: EM | Admit: 2016-12-22 | Discharge: 2016-12-22 | Disposition: A | Discharge status: Home / Self Care | Payer: Self-pay | Attending: Family Medicine | Admitting: Family Medicine

## 2016-12-22 DIAGNOSIS — M25572 Pain in left ankle and joints of left foot: Secondary | ICD-10-CM

## 2016-12-22 DIAGNOSIS — S82892A Other fracture of left lower leg, initial encounter for closed fracture: Secondary | ICD-10-CM

## 2016-12-22 DIAGNOSIS — S92152A Displaced avulsion fracture (chip fracture) of left talus, initial encounter for closed fracture: Secondary | ICD-10-CM

## 2016-12-22 DIAGNOSIS — W1842XA Slipping, tripping and stumbling without falling due to stepping into hole or opening, initial encounter: Secondary | ICD-10-CM

## 2016-12-22 NOTE — ED Provider Notes (Signed)
[facility]    CSN: [id number] Arrival date & time: 12/22/16  1350     History   Chief Complaint Chief Complaint  Patient presents with  . Ankle Pain    HPI Takirah Binford is a 56 y.o. female.   HPI  Is a 56 year old female who presents with a left ankle injury that occurred approximately 6 days ago. States that she was taken a short cut through her preacher's yard when she stepped into a hole and twisted her ankle into inversion. Since that time she's been able to walk on her foot but her ankle continues to swell and have large amount of bruising          Past Medical History:  Diagnosis Date  . Allergic rhinitis   . Asthma without status asthmaticus   . Diabetes mellitus without complication (HCC)   . GERD (gastroesophageal reflux disease)   . Hyperlipidemia   . IGT (impaired glucose tolerance)   . Obesity   . Osteopenia   . Vitamin D deficiency     Patient Active Problem List   Diagnosis Date Noted  . Asthma without status asthmaticus 11/16/2015  . IGT (impaired glucose tolerance) 11/16/2015  . URI (upper respiratory infection) 04/19/2015  . Morbid obesity (HCC) 08/15/2014  . Extrinsic asthma 06/14/2014    Past Surgical History:  Procedure Laterality Date  . ABDOMINAL HYSTERECTOMY  2001  . BREAST BIOPSY Bilateral    x2 1979/1993  . GALLBLADDER SURGERY      OB History    No data available       Home Medications    Prior to Admission medications   Medication Sig Start Date End Date Taking? Authorizing Provider  albuterol (PROVENTIL) (2.5 MG/3ML) 0.083% nebulizer solution Take 3 mLs (2.5 mg total) by nebulization every 6 (six) hours as needed for wheezing or shortness of breath. 06/09/16  Yes Conty, Orlando, MD  fluticasone (FLONASE) 50 MCG/ACT nasal spray Place 2 sprays into both nostrils daily. 03/03/16  Yes Domenick Gong, MD  fluticasone furoate-vilanterol (BREO ELLIPTA) 100-25 MCG/INH AEPB Inhale 1 puff into the lungs  daily. 11/28/16  Yes Kasa, Wallis Bamberg, MD  ibuprofen (ADVIL,MOTRIN) 200 MG tablet Take 400 mg by mouth every 6 (six) hours as needed.   Yes [provider]  loratadine (CLARITIN) 10 MG tablet Take 10 mg by mouth daily.   Yes [provider]  omeprazole (PRILOSEC) 20 MG capsule Take 40 mg by mouth daily.    Yes [provider]  dimenhyDRINATE (DRAMAMINE) 50 MG tablet Take 50 mg by mouth every 8 (eight) hours as needed.    [provider]    Family History Family History  Problem Relation Age of Onset  . Heart disease Mother   . Asthma Mother   . Diabetes Mother   . Stroke Father   . Breast cancer Paternal Grandmother 79    Social History Social History   Tobacco Use  . Smoking status: Former Smoker    Packs/day: 0.50    Years: 8.00    Pack years: 4.00    Types: Cigarettes  . Smokeless tobacco: Never Used  . Tobacco comment: quit smoking in 2000  Substance Use Topics  . Alcohol use: Yes    Alcohol/week: 0.0 oz    Comment: occasional glass of wine, last time friday  . Drug use: No     Allergies   Codeine   Review of Systems Review of Systems  Constitutional: Positive for activity change. Negative  for chills, fatigue and fever.  Musculoskeletal: Positive for gait problem and joint swelling.  All other systems reviewed and are negative.    Physical Exam Triage Vital Signs ED Triage Vitals  Enc Vitals Group     BP 12/22/16 1402 116/63     Pulse Rate 12/22/16 1402 92     Resp 12/22/16 1402 18     Temp 12/22/16 1402 98.9 F (37.2 C)     Temp Source 12/22/16 1402 Oral     SpO2 12/22/16 1402 97 %     Weight 12/22/16 1405 (!) 305 lb (138.3 kg)     Height 12/22/16 1405 5\' 7"  (1.702 m)     Head Circumference --      Peak Flow --      Pain Score 12/22/16 1405 6     Pain Loc --      Pain Edu? --      Excl. in GC? --    No data found.  Updated Vital Signs BP 116/63 (BP Location: Left Arm)   Pulse 92   Temp 98.9 F (37.2 C)  (Oral)   Resp 18   Ht 5\' 7"  (1.702 m)   Wt (!) 305 lb (138.3 kg)   LMP 07/18/2000 (Approximate)   SpO2 97%   BMI 47.77 kg/m   Visual Acuity Right Eye Distance:   Left Eye Distance:   Bilateral Distance:    Right Eye Near:   Left Eye Near:    Bilateral Near:     Physical Exam  Constitutional: She is oriented to person, place, and time. She appears well-developed and well-nourished. No distress.  HENT:  Head: Normocephalic.  Eyes: Pupils are equal, round, and reactive to light.  Neck: Normal range of motion.  Musculoskeletal: She exhibits edema and tenderness. She exhibits no deformity.  Examination of the left ankle and foot shows marked swelling. There is ecchymosis laterally. Motion is decreased with his comfort at the extremes particularly plantar flexion. An attempted eversion. Neurovascular function is intact distally. Maximal tenderness is is over the distal inferior tip of the lateral malleolus.  Neurological: She is alert and oriented to person, place, and time.  Skin: Skin is warm and dry. She is not diaphoretic.  Psychiatric: She has a normal mood and affect. Her behavior is normal. Judgment and thought content normal.  Nursing note and vitals reviewed.    UC Treatments / Results  Labs (all labs ordered are listed, but only abnormal results are displayed) Labs Reviewed - No data to display  EKG  EKG Interpretation None       Radiology Dg Ankle Complete Left  Result Date: 12/22/2016 CLINICAL DATA:  Lateral left ankle pain since a twisting injury 6 days ago. EXAM: LEFT ANKLE COMPLETE - 3+ VIEW COMPARISON:  None. FINDINGS: Diffuse soft tissue swelling. Nondisplaced fracture in the distal aspect of the lateral malleolus with a tiny adjacent fragment medially. There is also a linear avulsion fracture off the medial aspect of the talus with tiny adjacent fragments distally, adjacent to the medial malleolus. Associated ankle joint effusion. Minimal anterior  talotibial spur formation. Calcaneal spurs. IMPRESSION: 1. Nondisplaced distal lateral malleolus fracture. 2. Linear avulsion fracture off the medial aspect of the talus with tiny adjacent fragments. 3. Ankle joint effusion. 4. Minimal talotibial degenerative changes as well as calcaneal spurs. Electronically Signed   By: Beckie Salts M.D.   On: 12/22/2016 15:14    Procedures Procedures (including critical care time)  Medications Ordered in UC  Medications - No data to display   Initial Impression / Assessment and Plan / UC Course  I have reviewed the triage vital signs and the nursing notes.  Pertinent labs & imaging results that were available during my care of the patient were reviewed by me and considered in my medical decision making (see chart for details).     Plan: 1. Test/x-ray results and diagnosis reviewed with patient 2. rx as per orders; risks, benefits, potential side effects reviewed with patient 3. Recommend supportive treatment with use of your boot while ambulating. May take the boot off for sleep or during quiet times. Recommend adjunctive use of a crutch cane or walker if necessary to keep the ankle from hurting. Follow-up with orthopedics next week. Use ice 20 minutes out of every 2 hours 4-5 times daily to control swelling and pain elevate as necessary for pain control and swelling. 4. F/u prn if symptoms worsen or don't improve   Final Clinical Impressions(s) / UC Diagnoses   Final diagnoses:  Malleolar fracture, left, closed, initial encounter  Closed displaced avulsion fracture of left talus, initial encounter    New Prescriptions This SmartLink is deprecated. Use AVSMEDLIST instead to display the medication list for a patient.   Controlled Substance Prescriptions [place] Controlled Substance Registry consulted? Not Applicable   Lutricia FeilRoemer, Marylan Glore P, PA-C 12/22/16 1545

## 2016-12-22 NOTE — Discharge Instructions (Signed)
Elevate left ankle and foot above your heart frequently. Apply ice 20 minutes every 2 hours 45 times daily. Use Advil or Tylenol for combination for pain control. Recommend following up with orthopedics next week.

## 2016-12-22 NOTE — ED Triage Notes (Signed)
Per patient fell x 1 week ago and injury left ankle . Per patient left ankle pain / swelling.

## 2017-02-18 ENCOUNTER — Other Ambulatory Visit: Payer: Self-pay

## 2017-02-18 ENCOUNTER — Ambulatory Visit
Admission: EM | Admit: 2017-02-18 | Discharge: 2017-02-18 | Disposition: A | Discharge status: Home / Self Care | Payer: Self-pay | Attending: Family Medicine | Admitting: Family Medicine

## 2017-02-18 DIAGNOSIS — R05 Cough: Secondary | ICD-10-CM

## 2017-02-18 DIAGNOSIS — J4531 Mild persistent asthma with (acute) exacerbation: Secondary | ICD-10-CM

## 2017-02-18 MED ORDER — DOXYCYCLINE HYCLATE 100 MG PO TABS: 100.0000 mg | ORAL_TABLET | Freq: Two times a day (BID) | ORAL | 0 refills | Status: DC

## 2017-02-18 MED ORDER — PREDNISONE 20 MG PO TABS: ORAL_TABLET | ORAL | 0 refills | Status: DC

## 2017-02-18 MED ORDER — GUAIFENESIN-CODEINE 100-10 MG/5ML PO SOLN: ORAL | 0 refills | Status: DC

## 2017-02-18 NOTE — ED Triage Notes (Signed)
Patient complains of cough, congestion, wheezing, sinus pain and pressure. Patient states that symptoms started around Wednesday/Thursday and have worsened. Patient has tried cold and sinus relief and mucinex without relief.

## 2017-02-18 NOTE — ED Provider Notes (Signed)
[facility]    CSN: [id number] Arrival date & time: 02/18/17  0818     History   Chief Complaint Chief Complaint  Patient presents with  . Cough    HPI Kimberly Vasquez is a 57 y.o. female.   The history is provided by the patient.  Cough  Associated symptoms: wheezing   URI  Presenting symptoms: congestion, cough and fatigue   Severity:  Moderate Onset quality:  Sudden Duration:  1 week Timing:  Constant Progression:  Worsening Chronicity:  New Relieved by:  Nothing Ineffective treatments:  OTC medications Associated symptoms: wheezing   Risk factors: chronic respiratory disease (asthma) and sick contacts   Risk factors: not elderly, no chronic cardiac disease, no chronic kidney disease, no diabetes mellitus, no immunosuppression, no recent illness and no recent travel     Past Medical History:  Diagnosis Date  . Allergic rhinitis   . Asthma without status asthmaticus   . Diabetes mellitus without complication (HCC)   . GERD (gastroesophageal reflux disease)   . Hyperlipidemia   . IGT (impaired glucose tolerance)   . Obesity   . Osteopenia   . Vitamin D deficiency     Patient Active Problem List   Diagnosis Date Noted  . Asthma without status asthmaticus 11/16/2015  . IGT (impaired glucose tolerance) 11/16/2015  . URI (upper respiratory infection) 04/19/2015  . Morbid obesity (HCC) 08/15/2014  . Extrinsic asthma 06/14/2014    Past Surgical History:  Procedure Laterality Date  . ABDOMINAL HYSTERECTOMY  2001  . BREAST BIOPSY Bilateral    x2 1979/1993  . ESOPHAGOGASTRODUODENOSCOPY (EGD) WITH PROPOFOL N/A 01/02/2016   Procedure: ESOPHAGOGASTRODUODENOSCOPY (EGD) WITH PROPOFOL;  Surgeon: Midge Minium, MD;  Location: [facility];  Service: Endoscopy;  Laterality: N/A;  . GALLBLADDER SURGERY      OB History    No data available       Home Medications    Prior to Admission medications   Medication Sig Start Date End Date Taking?  Authorizing Provider  albuterol (PROVENTIL) (2.5 MG/3ML) 0.083% nebulizer solution Take 3 mLs (2.5 mg total) by nebulization every 6 (six) hours as needed for wheezing or shortness of breath. 06/09/16  Yes Marks Scalera, MD  fluticasone (FLONASE) 50 MCG/ACT nasal spray Place 2 sprays into both nostrils daily. 03/03/16  Yes Domenick Gong, MD  fluticasone furoate-vilanterol (BREO ELLIPTA) 100-25 MCG/INH AEPB Inhale 1 puff into the lungs daily. 11/28/16  Yes Kasa, Wallis Bamberg, MD  ibuprofen (ADVIL,MOTRIN) 200 MG tablet Take 400 mg by mouth every 6 (six) hours as needed.   Yes [provider]  loratadine (CLARITIN) 10 MG tablet Take 10 mg by mouth daily.   Yes [provider]  omeprazole (PRILOSEC) 20 MG capsule Take 40 mg by mouth daily.    Yes [provider]  dimenhyDRINATE (DRAMAMINE) 50 MG tablet Take 50 mg by mouth every 8 (eight) hours as needed.    [provider]  doxycycline (VIBRA-TABS) 100 MG tablet Take 1 tablet (100 mg total) by mouth 2 (two) times daily. 02/18/17   Payton Mccallum, MD  guaiFENesin-codeine 100-10 MG/5ML syrup 10 ml po qhs prn 02/18/17   Payton Mccallum, MD  guaiFENesin-codeine 100-10 MG/5ML syrup 10 ml po qhs prn 02/18/17   Payton Mccallum, MD  predniSONE (DELTASONE) 20 MG tablet 3 tabs po qd x 2 days , then 2 tabs po qd x 2 days, then 1 tab po qd x 2 days, then half a tab po qd x 2 days  02/18/17   Payton Mccallum, MD    Family History Family History  Problem Relation Age of Onset  . Heart disease Mother   . Asthma Mother   . Diabetes Mother   . Stroke Father   . Breast cancer Paternal Grandmother 18    Social History Social History   Tobacco Use  . Smoking status: Former Smoker    Packs/day: 0.50    Years: 8.00    Pack years: 4.00    Types: Cigarettes  . Smokeless tobacco: Never Used  . Tobacco comment: quit smoking in 2000  Substance Use Topics  . Alcohol use: Yes    Alcohol/week: 0.0 oz    Comment: occasional glass of wine,  last time friday  . Drug use: No     Allergies   Codeine   Review of Systems Review of Systems  Constitutional: Positive for fatigue.  HENT: Positive for congestion.   Respiratory: Positive for cough and wheezing.      Physical Exam Triage Vital Signs ED Triage Vitals  Enc Vitals Group     BP --      Pulse Rate 02/18/17 0831 92     Resp 02/18/17 0831 18     Temp 02/18/17 0831 99 F (37.2 C)     Temp Source 02/18/17 0831 Oral     SpO2 02/18/17 0831 94 %     Weight 02/18/17 0828 (!) 302 lb (137 kg)     Height 02/18/17 0828 5' 7.5" (1.715 m)     Head Circumference --      Peak Flow --      Pain Score 02/18/17 0828 7     Pain Loc --      Pain Edu? --      Excl. in GC? --    No data found.  Updated Vital Signs Pulse 92   Temp 99 F (37.2 C) (Oral)   Resp 18   Ht 5' 7.5" (1.715 m)   Wt (!) 302 lb (137 kg)   LMP 07/18/2000 (Approximate)   SpO2 94%   BMI 46.60 kg/m   Visual Acuity Right Eye Distance:   Left Eye Distance:   Bilateral Distance:    Right Eye Near:   Left Eye Near:    Bilateral Near:     Physical Exam  Constitutional: She appears well-developed and well-nourished. No distress.  HENT:  Head: Normocephalic and atraumatic.  Right Ear: Tympanic membrane, external ear and ear canal normal.  Left Ear: Tympanic membrane, external ear and ear canal normal.  Nose: No mucosal edema, rhinorrhea, nose lacerations, sinus tenderness, nasal deformity, septal deviation or nasal septal hematoma. No epistaxis.  No foreign bodies. Right sinus exhibits no maxillary sinus tenderness and no frontal sinus tenderness. Left sinus exhibits no maxillary sinus tenderness and no frontal sinus tenderness.  Mouth/Throat: Uvula is midline, oropharynx is clear and moist and mucous membranes are normal. No oropharyngeal exudate.  Eyes: Conjunctivae and EOM are normal. Pupils are equal, round, and reactive to light. Right eye exhibits no discharge. Left eye exhibits no  discharge. No scleral icterus.  Neck: Normal range of motion. Neck supple. No thyromegaly present.  Cardiovascular: Normal rate, regular rhythm and normal heart sounds.  Pulmonary/Chest: Effort normal. No stridor. No respiratory distress. She has wheezes. She has rales. She exhibits no tenderness.  Lymphadenopathy:    She has no cervical adenopathy.  Skin: She is not diaphoretic.  Nursing note and vitals reviewed.    UC Treatments / Results  Labs (all labs ordered are listed, but only abnormal results are displayed) Labs Reviewed - No data to display  EKG  EKG Interpretation None       Radiology No results found.  Procedures Procedures (including critical care time)  Medications Ordered in UC Medications - No data to display   Initial Impression / Assessment and Plan / UC Course  I have reviewed the triage vital signs and the nursing notes.  Pertinent labs & imaging results that were available during my care of the patient were reviewed by me and considered in my medical decision making (see chart for details).      Final Clinical Impressions(s) / UC Diagnoses   Final diagnoses:  Mild persistent asthma with exacerbation    ED Discharge Orders        Ordered    doxycycline (VIBRA-TABS) 100 MG tablet  2 times daily     02/18/17 0844    predniSONE (DELTASONE) 20 MG tablet     02/18/17 0844    guaiFENesin-codeine 100-10 MG/5ML syrup     02/18/17 0846    guaiFENesin-codeine 100-10 MG/5ML syrup     02/18/17 0847     1. diagnosis reviewed with patient 2. rx as per orders above; reviewed possible side effects, interactions, risks and benefits  3. Recommend supportive treatment with rest, fluids  4. Follow-up prn if symptoms worsen or don't improve   Controlled Substance Prescriptions [place] Controlled Substance Registry consulted? Not Applicable   Payton Mccallumonty, Chanze Teagle, MD 02/18/17 [phone]

## 2017-02-20 IMAGING — US US EXTREM LOW VENOUS*L*
1 series · 14 of 24 positions shown · non-contrast
Comparison: None.

CLINICAL DATA: Left knee pain for 3 weeks.

EXAM:
LEFT LOWER EXTREMITY VENOUS DOPPLER ULTRASOUND
TECHNIQUE: Gray-scale sonography with graded compression, as well as color
Doppler and duplex ultrasound, were performed to evaluate the deep
venous system from the level of the common femoral vein through the
popliteal and proximal calf veins. Spectral Doppler was utilized to
evaluate flow at rest and with distal augmentation maneuvers.

[Series 1: us extrem low venous*left* · 0.07mm/px · 14 of 34 slices shown]
[im 1/34]
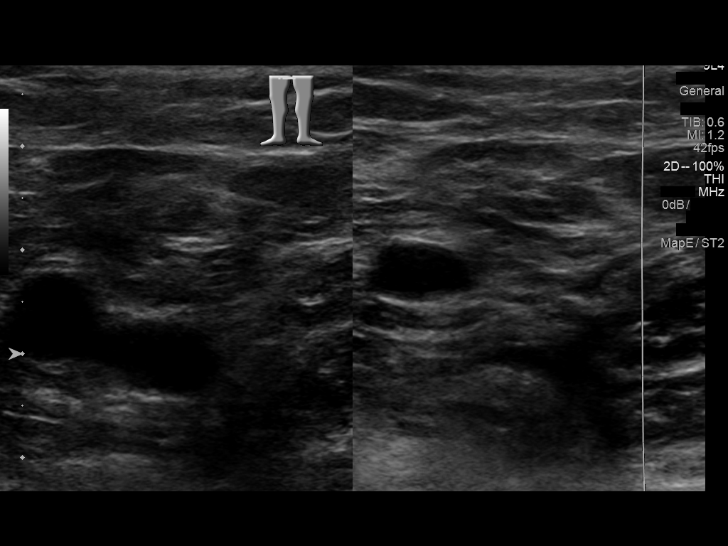
[im 3/34]
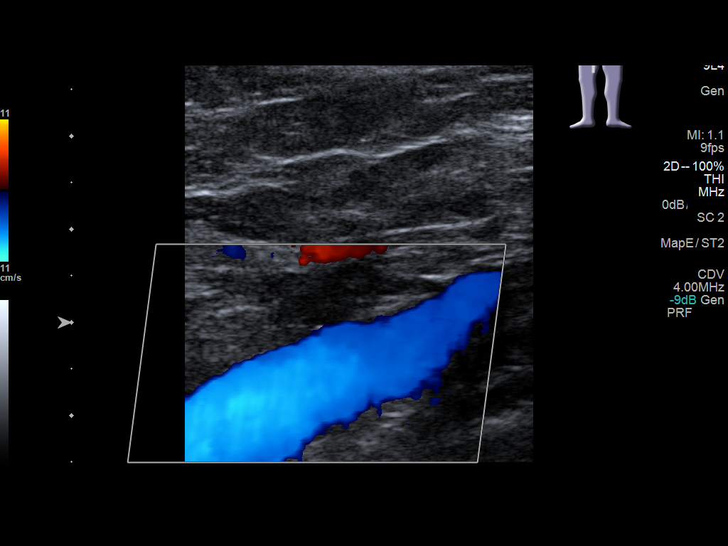
[im 6/34]
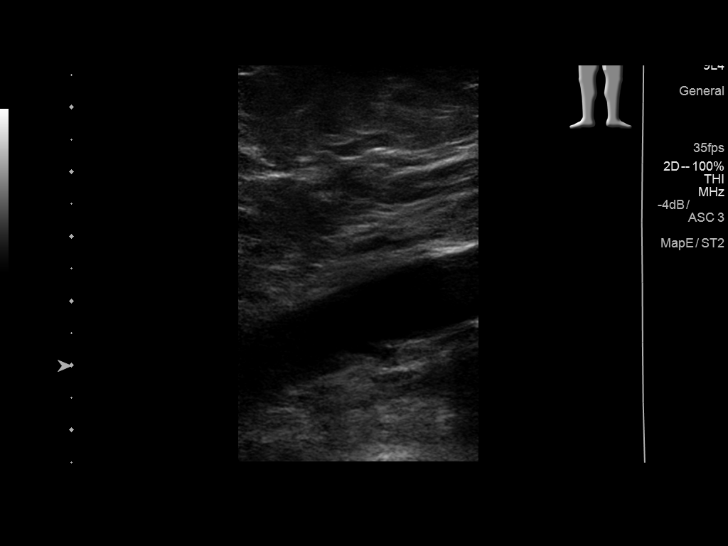
[im 9/34]
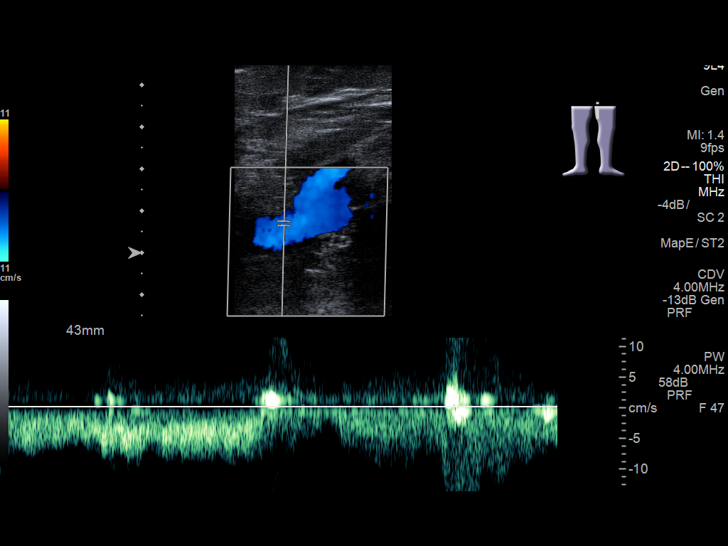
[im 11/34]
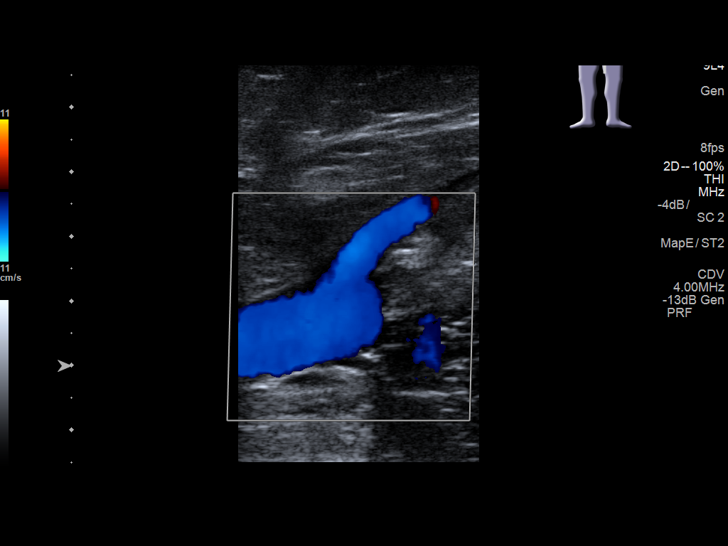
[im 13/34]
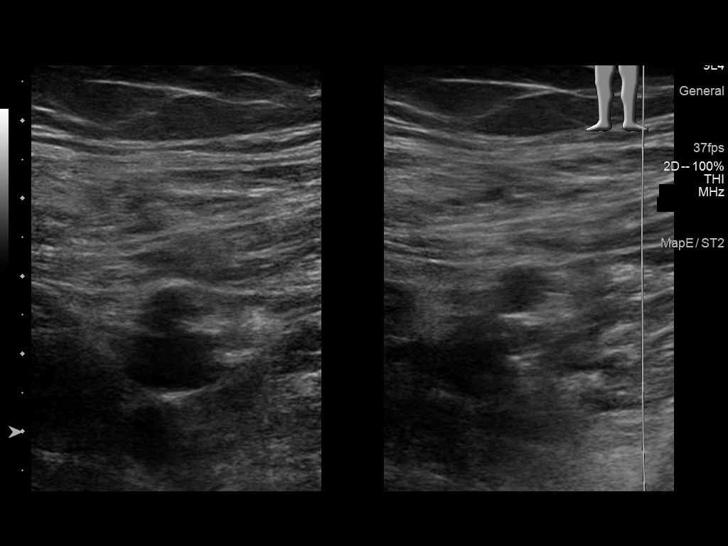
[im 16/34]
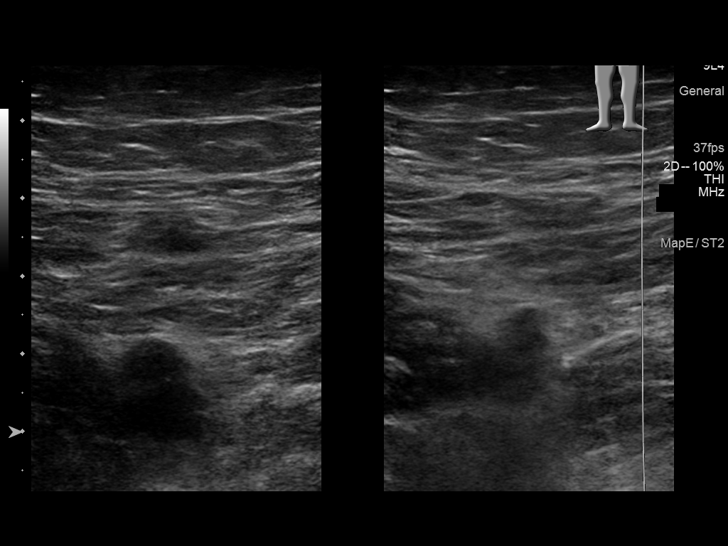
[im 18/34]
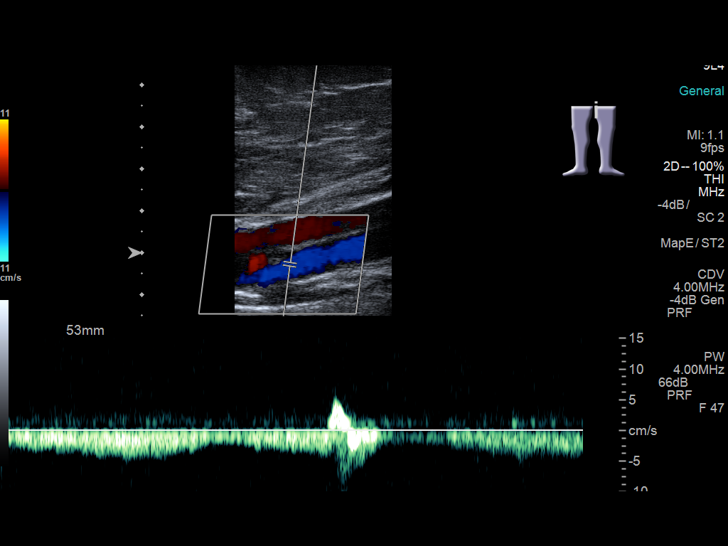
[im 21/34]
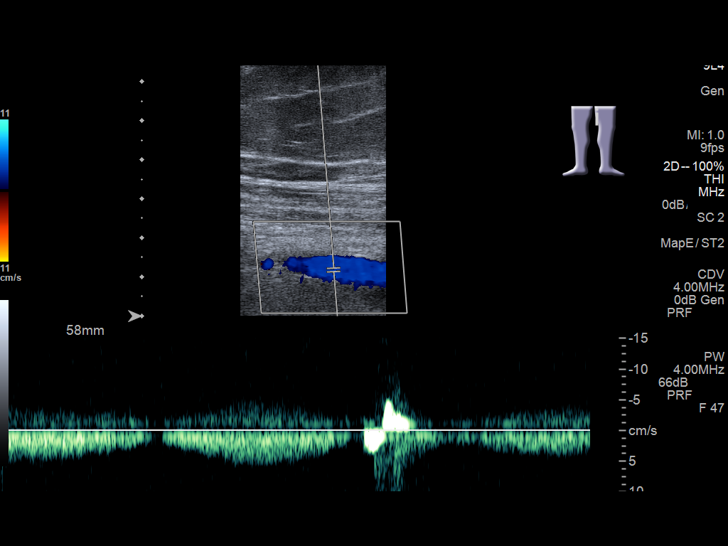
[im 23/34]
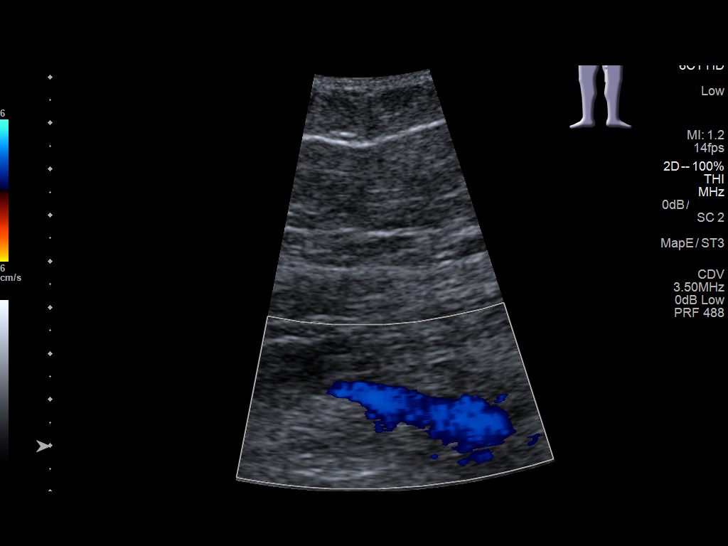
[im 26/34]
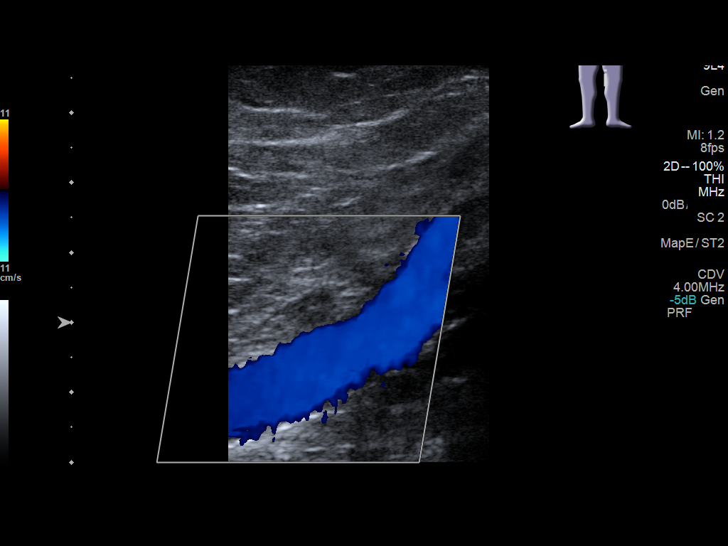
[im 28/34]
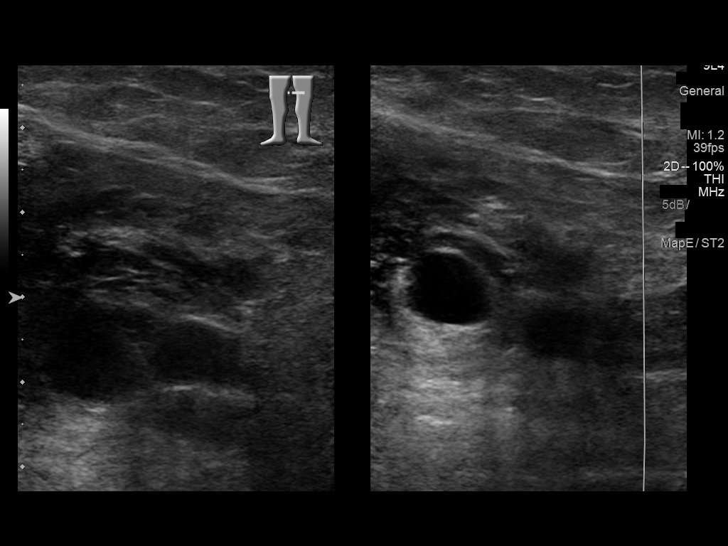
[im 31/34]
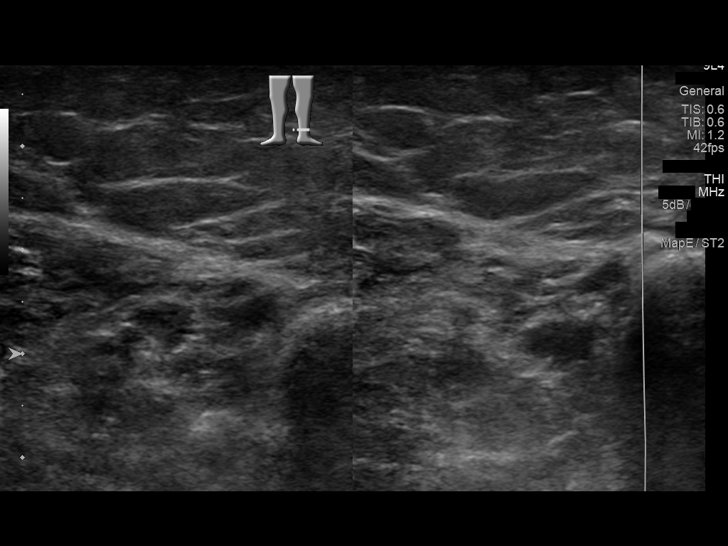
[im 34/34]
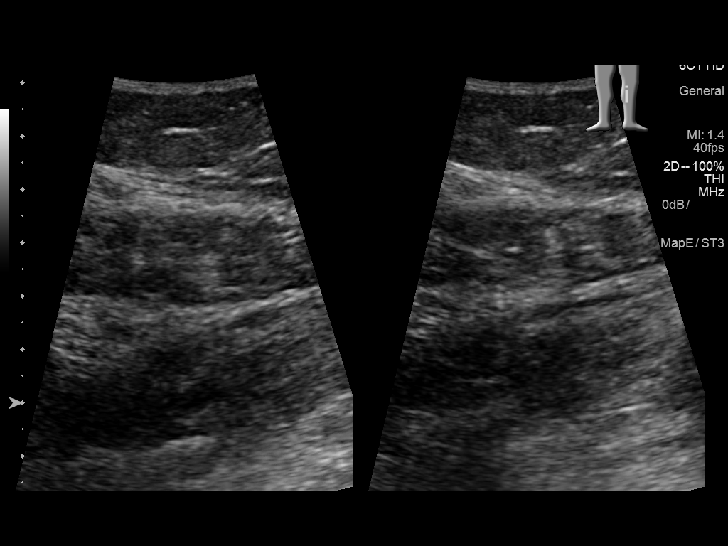

[14 of 24 positions shown; findings below may reference images not displayed]

FINDINGS: Right common femoral vein is patent without thrombus.

Normal compressibility, augmentation and color Doppler flow in the
left common femoral vein, left femoral vein and left popliteal vein.
The left saphenofemoral junction is patent. Left profunda femoral
vein is patent without thrombus. Visualized left deep calf veins are
patent without thrombus.
IMPRESSION: Negative for deep venous thrombosis in left lower extremity.

## 2017-05-17 ENCOUNTER — Ambulatory Visit
Admission: EM | Admit: 2017-05-17 | Discharge: 2017-05-17 | Disposition: A | Discharge status: Home / Self Care | Payer: Self-pay | Attending: Emergency Medicine | Admitting: Emergency Medicine

## 2017-05-17 ENCOUNTER — Other Ambulatory Visit: Payer: Self-pay

## 2017-05-17 DIAGNOSIS — S46211A Strain of muscle, fascia and tendon of other parts of biceps, right arm, initial encounter: Secondary | ICD-10-CM

## 2017-05-17 DIAGNOSIS — M79601 Pain in right arm: Secondary | ICD-10-CM | POA: Insufficient documentation

## 2017-05-17 DIAGNOSIS — R05 Cough: Secondary | ICD-10-CM

## 2017-05-17 DIAGNOSIS — E119 Type 2 diabetes mellitus without complications: Secondary | ICD-10-CM | POA: Insufficient documentation

## 2017-05-17 DIAGNOSIS — R42 Dizziness and giddiness: Secondary | ICD-10-CM

## 2017-05-17 DIAGNOSIS — R0602 Shortness of breath: Secondary | ICD-10-CM

## 2017-05-17 DIAGNOSIS — R202 Paresthesia of skin: Secondary | ICD-10-CM

## 2017-05-17 DIAGNOSIS — J45901 Unspecified asthma with (acute) exacerbation: Secondary | ICD-10-CM

## 2017-05-17 DIAGNOSIS — Z79899 Other long term (current) drug therapy: Secondary | ICD-10-CM | POA: Insufficient documentation

## 2017-05-17 DIAGNOSIS — Z87891 Personal history of nicotine dependence: Secondary | ICD-10-CM | POA: Insufficient documentation

## 2017-05-17 DIAGNOSIS — K219 Gastro-esophageal reflux disease without esophagitis: Secondary | ICD-10-CM | POA: Insufficient documentation

## 2017-05-17 HISTORY — DX: Dizziness and giddiness: R42

## 2017-05-17 HISTORY — DX: Morbid (severe) obesity due to excess calories: E66.01

## 2017-05-17 MED ORDER — METHOCARBAMOL 750 MG PO TABS: 750.0000 mg | ORAL_TABLET | ORAL | 0 refills | Status: DC

## 2017-05-17 MED ORDER — PREDNISONE 10 MG (21) PO TBPK: ORAL_TABLET | ORAL | 0 refills | Status: DC

## 2017-05-17 MED ORDER — IBUPROFEN 600 MG PO TABS: 600.0000 mg | ORAL_TABLET | Freq: Four times a day (QID) | ORAL | 0 refills | Status: DC | PRN

## 2017-05-17 MED ORDER — ALBUTEROL SULFATE HFA 108 (90 BASE) MCG/ACT IN AERS: 1.0000 | INHALATION_SPRAY | Freq: Four times a day (QID) | RESPIRATORY_TRACT | 0 refills | Status: DC | PRN

## 2017-05-17 NOTE — ED Provider Notes (Signed)
HPI  SUBJECTIVE:  Kimberly Vasquez is a 57 y.o. female who presents with multiple symptoms.  First, she reports 2 days of intermittent, seconds long pulsating pain along her distal right bicep.  She is left-handed.  She denies excessive lifting or picking up heavy things, change in her physical activity.  No trauma to the arm.  No swelling, erythema, repetitive motion.  She has not felt a pop or tearing feeling.  No arm swelling.  No neck pain, grip weakness.  No aggravating or alleviating factors.  She has not tried anything for this.  Second she reports bilateral hand tingling last night while lying down only.  She denies neck pain, grip weakness, trauma to her neck.  It lasts seconds and resolves when she moves her hands or sits up.  Symptoms are worse with lying down.  no chest pain.  Symptoms are she does report some shortness of breath and dyspnea on exertion and states that her asthma has been acting up for several weeks.  She states that she has a cough that is been waking up at night.  Third, she reports lightheadedness starting this morning.  It Is intermittent, lasting seconds.  She has had similar symptoms when her allergies have been bothering her.  She reports nasal congestion, rhinorrhea, postnasal drip, ear popping.  No vertigo, ear pain, ear fullness, tenderness.  No chest pain, palpitations, syncope.  She tried anything for the lightheadedness.  Symptoms are worse with going from sitting to standing, turning her head and watching things move.  She states that she drinks less than 2 L of water a day but has been eating and drinking normally for her.  No change in her meds recently.  She has a past medical history of gestational diabetes, asthma, vertigo, obesity.  No history of hypertension, degenerative disc disease in her neck, osteoarthritis of the neck, tendon rupture.  No recent antibiotics.  PMD: None.    Past Medical History:  Diagnosis Date  . Allergic rhinitis   . Asthma  without status asthmaticus   . Diabetes mellitus without complication (HCC)   . GERD (gastroesophageal reflux disease)   . Hyperlipidemia   . IGT (impaired glucose tolerance)   . Morbid obesity (HCC)   . Obesity   . Osteopenia   . Vertigo   . Vitamin D deficiency     Past Surgical History:  Procedure Laterality Date  . ABDOMINAL HYSTERECTOMY  2001  . BREAST BIOPSY Bilateral    x2 1979/1993  . ESOPHAGOGASTRODUODENOSCOPY (EGD) WITH PROPOFOL N/A 01/02/2016   Procedure: ESOPHAGOGASTRODUODENOSCOPY (EGD) WITH PROPOFOL;  Surgeon: Midge Miniumarren Wohl, MD;  Location: [facility] ENDOSCOPY;  Service: Endoscopy;  Laterality: N/A;  . GALLBLADDER SURGERY      Family History  Problem Relation Age of Onset  . Heart disease Mother   . Asthma Mother   . Diabetes Mother   . Stroke Father   . Breast cancer Paternal Grandmother [id number]    Social History   Tobacco Use  . Smoking status: Former Smoker    Packs/day: 0.50    Years: 8.00    Pack years: 4.00    Types: Cigarettes  . Smokeless tobacco: Never Used  . Tobacco comment: quit smoking in 2000  Substance Use Topics  . Alcohol use: Yes    Alcohol/week: 0.0 oz    Comment: occasional glass of wine, last time friday  . Drug use: No    No current facility-administered medications for this encounter.   Current Outpatient Medications:  .  albuterol (PROVENTIL HFA;VENTOLIN HFA) 108 (90 Base) MCG/ACT inhaler, Inhale 1-2 puffs into the lungs every 6 (six) hours as needed for wheezing or shortness of breath., Disp: 1 Inhaler, Rfl: 0 .  albuterol (PROVENTIL) (2.5 MG/3ML) 0.083% nebulizer solution, Take 3 mLs (2.5 mg total) by nebulization every 6 (six) hours as needed for wheezing or shortness of breath., Disp: 75 mL, Rfl: 0 .  dimenhyDRINATE (DRAMAMINE) 50 MG tablet, Take 50 mg by mouth every 8 (eight) hours as needed., Disp: , Rfl:  .  fluticasone (FLONASE) 50 MCG/ACT nasal spray, Place 2 sprays into both nostrils daily., Disp: 16 g, Rfl: 0 .  fluticasone  furoate-vilanterol (BREO ELLIPTA) 100-25 MCG/INH AEPB, Inhale 1 puff into the lungs daily., Disp: 3 each, Rfl: 3 .  ibuprofen (ADVIL,MOTRIN) 600 MG tablet, Take 1 tablet (600 mg total) by mouth every 6 (six) hours as needed., Disp: 30 tablet, Rfl: 0 .  loratadine (CLARITIN) 10 MG tablet, Take 10 mg by mouth daily., Disp: , Rfl:  .  methocarbamol (ROBAXIN) 750 MG tablet, Take 1 tablet (750 mg total) by mouth every 4 (four) hours., Disp: 40 tablet, Rfl: 0 .  omeprazole (PRILOSEC OTC) 20 MG tablet, Take by mouth., Disp: , Rfl:  .  omeprazole (PRILOSEC) 20 MG capsule, Take 40 mg by mouth daily. , Disp: , Rfl:  .  predniSONE (STERAPRED UNI-PAK 21 TAB) 10 MG (21) TBPK tablet, Dispense one 6 day pack. Take as directed with food., Disp: 21 tablet, Rfl: 0  Allergies  Allergen Reactions  . Codeine Nausea And Vomiting    Can't take it in pill form     ROS  As noted in HPI.   Physical Exam  BP (!) 160/78 (BP Location: Left Arm)   Pulse 86   Temp 97.8 F (36.6 C) (Oral)   Resp 18   Ht 5\' 7"  (1.702 m)   Wt (!) 313 lb (142 kg)   LMP 07/18/2000 (Approximate)   SpO2 97%   BMI 49.02 kg/m   Constitutional: Well developed, well nourished, no acute distress Eyes:  EOMI, conjunctiva normal bilaterally HENT: Normocephalic, atraumatic,mucus membranes moist.  TMs normal.  Mild nasal congestion.  No sinus tenderness.  Positive postnasal drip. Respiratory: Normal inspiratory effort, lungs clear bilaterally Cardiovascular: Normal rate GI: nondistended skin: No rash, skin intact Musculoskeletal: no deformities, no soft tissue defect.  Positive tenderness along the belly of the right biceps muscle, positive mild muscle spasm.  She is neurovascularly intact in the median/radial/ulnar distribution bilaterally.  RP 2+ and equal bilaterally.  No C-spine or trapezial tenderness.  Negative Tinel, negative Phalen. Neurologic: Alert & oriented x 3, no focal neuro deficits.  Finger-nose, heel shin within normal  limits.  Romberg negative, cranial nerves II through XII intact, tandem gait steady. Psychiatric: Speech and behavior appropriate   ED Course   Medications - No data to display  Orders Placed This Encounter  Procedures  . ED EKG    Arm pain    Standing Status:   Standing    Number of Occurrences:   1    Order Specific Question:   Reason for Exam    Answer:   Other (See Comments)  . EKG 12-Lead    Standing Status:   Standing    Number of Occurrences:   1    No results found for this or any previous visit (from the past 24 hour(s)). No results found.  ED Clinical Impression  Strain of right biceps muscle, initial encounter  Paresthesia of both hands  Lightheadedness  Asthma with acute exacerbation, unspecified asthma severity, unspecified whether persistent   ED Assessment/Plan  EKG: Normal sinus rhythm, rate 84.  Normal axis, normal intervals.  No hypertrophy.  No ST-T wave changes.  No previous EKG for comparison.  1.  Bicep pain.  Seems to be very musculoskeletal it is reproducible with palpation she does have a mild muscle spasm along the belly of the biceps muscle.  No evidence of biceps tendinitis or biceps tendon rupture.  Will send home with Tylenol, ibuprofen, Robaxin.  2.  Bilateral hand paresthesias.  Unsure as to the etiology of this.  Does not seem to be carpal tunnel syndrome or circulatory.  3.  Lightheadedness this morning.  Patient has had this before when her allergies get bad and she states that they have been bad for several weeks.  No evidence of significant dehydration.  Doubt cardiac etiology.  Also suspect that she is not drinking enough water.  Advised pushing fluids.  4.  Asthma exacerbation.  Refilling albuterol inhaler.  She states that she has a spacer at home.  We will give her prednisone 6-day taper.  Will provide a primary care referral and order primary care follow-up.  Meds ordered this encounter  Medications  . methocarbamol (ROBAXIN)  750 MG tablet    Sig: Take 1 tablet (750 mg total) by mouth every 4 (four) hours.    Dispense:  40 tablet    Refill:  0  . ibuprofen (ADVIL,MOTRIN) 600 MG tablet    Sig: Take 1 tablet (600 mg total) by mouth every 6 (six) hours as needed.    Dispense:  30 tablet    Refill:  0  . albuterol (PROVENTIL HFA;VENTOLIN HFA) 108 (90 Base) MCG/ACT inhaler    Sig: Inhale 1-2 puffs into the lungs every 6 (six) hours as needed for wheezing or shortness of breath.    Dispense:  1 Inhaler    Refill:  0  . predniSONE (STERAPRED UNI-PAK 21 TAB) 10 MG (21) TBPK tablet    Sig: Dispense one 6 day pack. Take as directed with food.    Dispense:  21 tablet    Refill:  0    *This clinic note was created using Scientist, clinical (histocompatibility and immunogenetics). Therefore, there may be occasional mistakes despite careful proofreading.   ?   Domenick Gong, MD 05/18/17 [id number]

## 2017-05-17 NOTE — ED Triage Notes (Signed)
Pt with intermittent bicep pain x past several days. Last night both hands were going numb. Today has nausea.

## 2017-05-17 NOTE — Discharge Instructions (Addendum)
Here is a list of primary care providers who are taking new patients: ° °Dr. Deanna Jones, Dr. William Plonk °[address] °[address] °[place] [address] °[phone] ° °[facility] [place] °[address] [place] [address]  °[place] [address]  °[phone] ° °[facility] °[address]  °[place], [place] [address] °[phone] ° °[facility] [place] °[address]  °[phone] °[place], [place] [address] ° °Here are clinics/ other resources who will see you if you do not have insurance. Some have certain criteria that you must meet. Call them and find out what they are: ° °[facility]: °[address] [place] [address] [place], [place] [address] °Phone: [phone] °Hours: First and Third Saturdays of each Month, 9 a.m. - 1 p.m. ° °[facility]: °[address], [place], [place] [address] °Phone: [phone] °Hours: °Tuesday, 4 p.m. - 8 p.m. °Thursday, 1 p.m. - 8 p.m. °Wednesday, 9 a.m. - Noon ° °[facility] °[address], [place], [place] [address] °Phone: [phone] °Pharmacy Phone Number: [phone] °Dental Phone Number: [phone] °ACA Insurance Help: [phone] ° °Dental Hours: °Monday - Thursday, 8 a.m. - 6 p.m. ° °[facility] °[address] [place], [place] [address] °Phone: [phone] °Pharmacy Phone Number: [phone] °ACA Insurance Help: [phone] ° °[facility] °[address] [place], [place] [address] °Phone: [phone] °Pharmacy Phone Number: [phone] °ACA Insurance Help: [phone] ° °[facility] °[address] [place], [place] [address] °Phone: [phone] °ACA Insurance Help: [phone]  ° °[facility] °[address] , [place] [address] °Phone: [phone] ° °Go to www.goodrx.com to look up your medications. This will give you a list of where you can find your prescriptions at the most affordable prices. Or ask the pharmacist what the cash price is,  or if they have any other discount programs available to help make your medication more affordable. This can be less expensive than what you would pay with insurance.   °

## 2017-05-27 IMAGING — CR DG KNEE COMPLETE 4+V*L*
5 series · 5 of 5 positions shown · non-contrast
Comparison: None.

CLINICAL DATA: Left knee pain 2 weeks ago without known injury.

EXAM:
LEFT KNEE - COMPLETE 4+ VIEW

[knee ap]
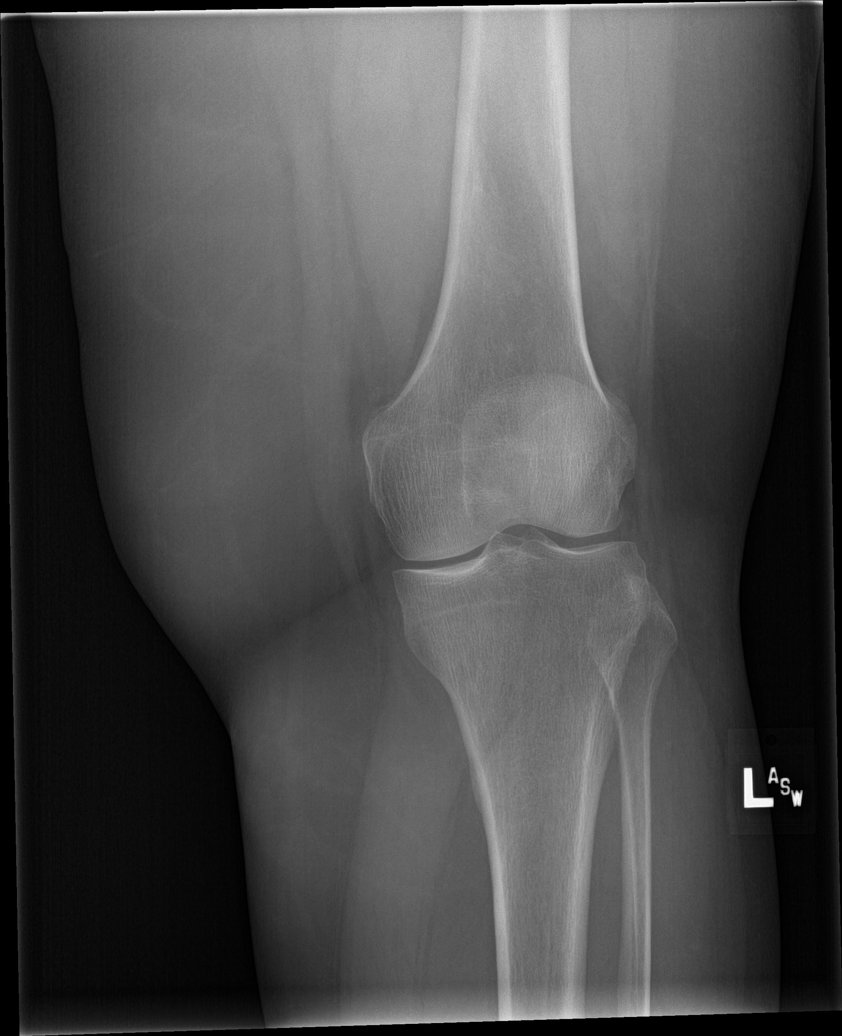

[knee lat]
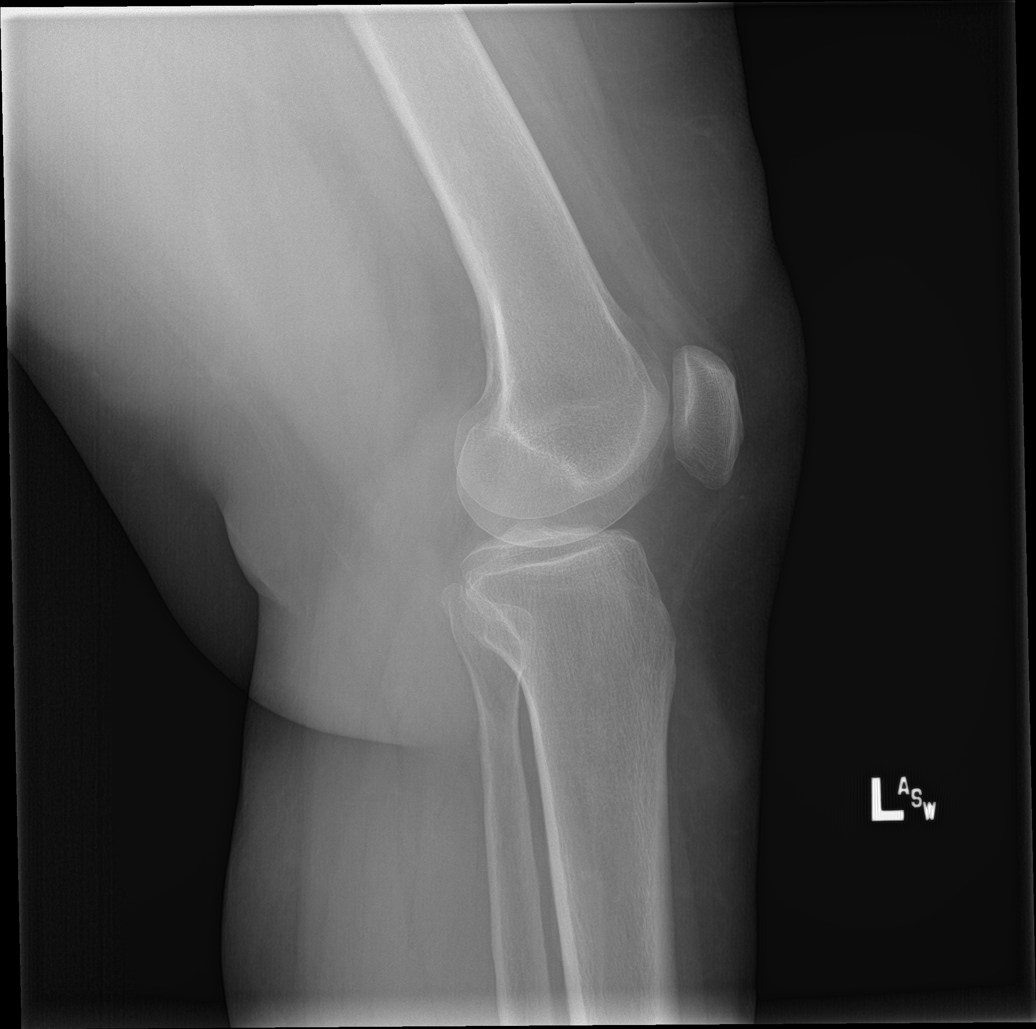

[tunnel]
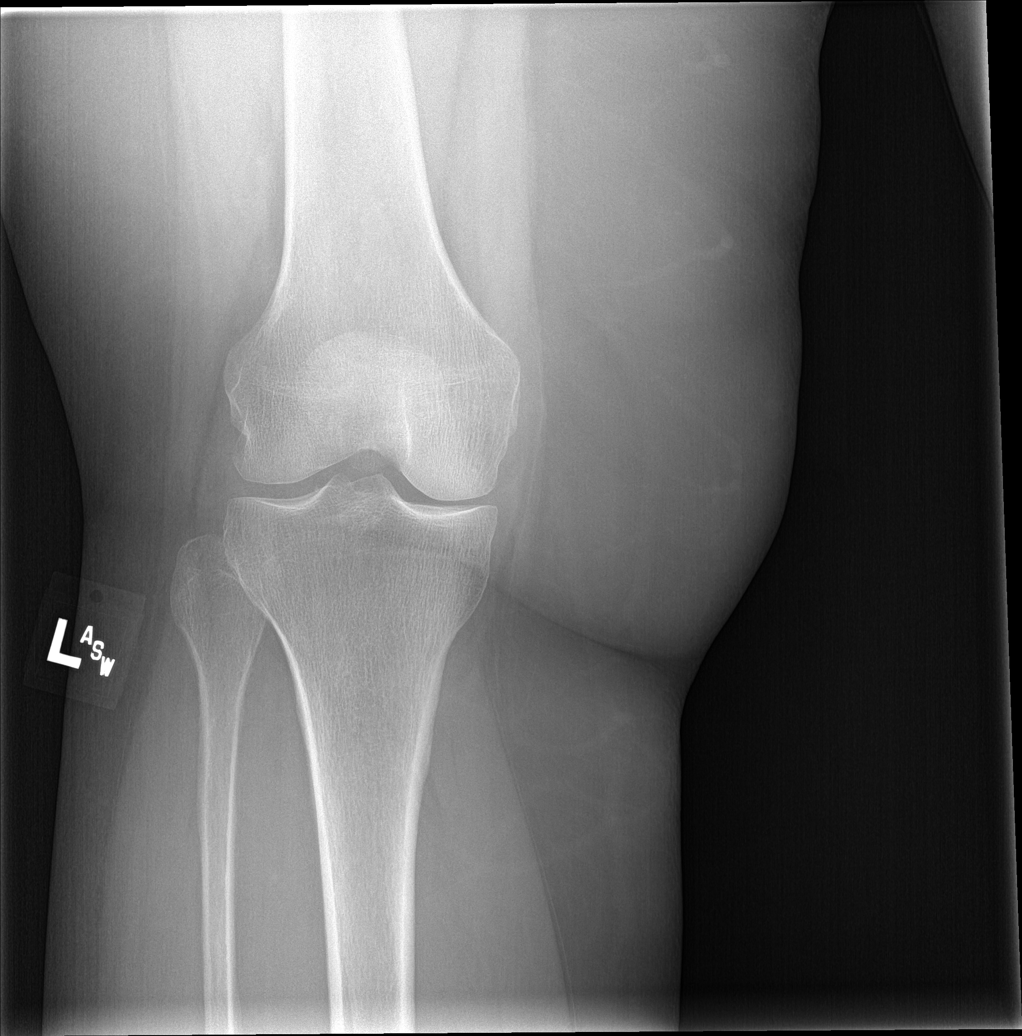

[patella skyline (1 of 2)]
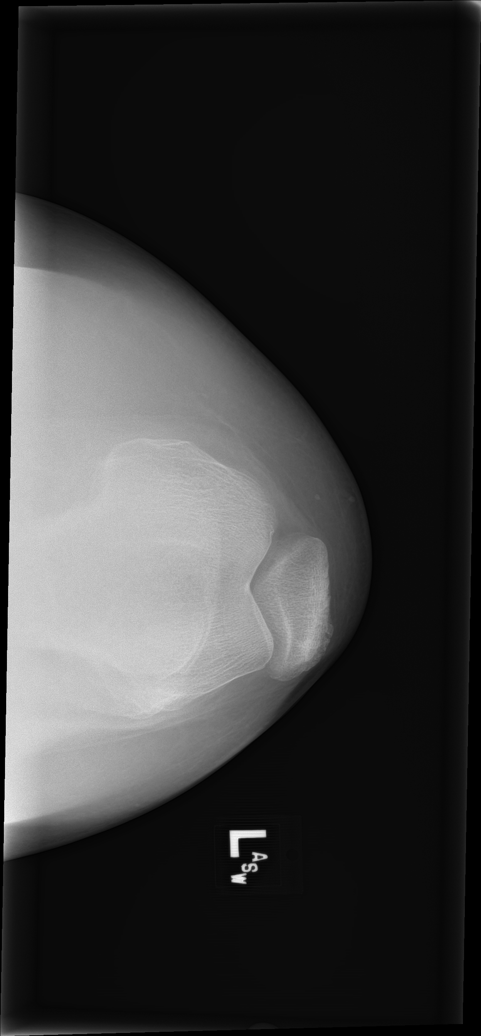

[patella skyline (2 of 2)]
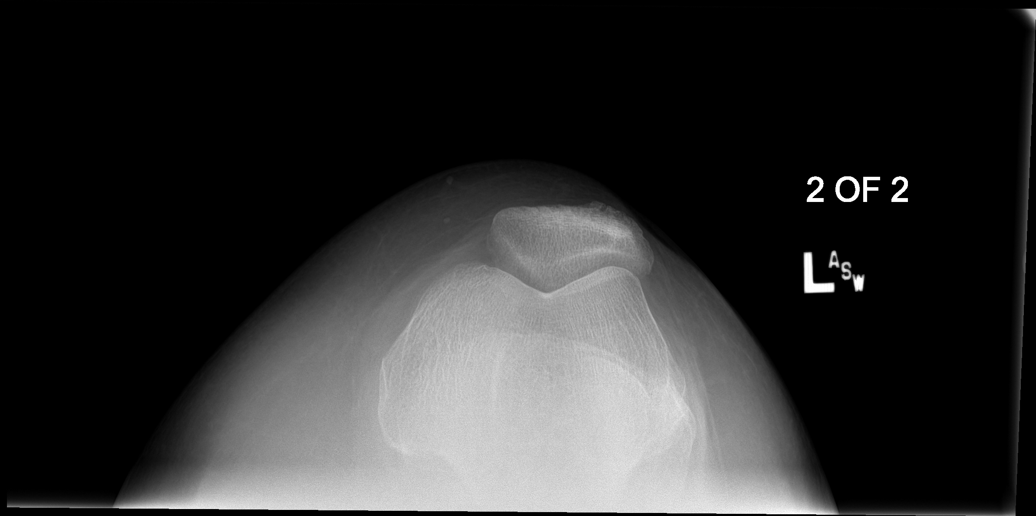

[5 of 5 positions shown; findings below may reference images not displayed]

FINDINGS: No evidence of fracture, dislocation, or joint effusion. No evidence
of arthropathy or other focal bone abnormality. Soft tissues are
unremarkable.
IMPRESSION: Negative.

## 2017-06-13 ENCOUNTER — Encounter: Payer: Self-pay | Admitting: Emergency Medicine

## 2017-06-13 ENCOUNTER — Other Ambulatory Visit: Payer: Self-pay

## 2017-06-13 ENCOUNTER — Ambulatory Visit
Admission: EM | Admit: 2017-06-13 | Discharge: 2017-06-13 | Disposition: A | Discharge status: Home / Self Care | Payer: Self-pay | Attending: Family Medicine | Admitting: Family Medicine

## 2017-06-13 DIAGNOSIS — J4541 Moderate persistent asthma with (acute) exacerbation: Secondary | ICD-10-CM

## 2017-06-13 MED ORDER — PREDNISONE 50 MG PO TABS: ORAL_TABLET | ORAL | 0 refills | Status: DC

## 2017-06-13 NOTE — ED Triage Notes (Signed)
Patient in today c/o cough, wheezing and sob x 5-6 days. Patient feels like she has bronchitis. Patient denies fever. Patient has used her nebulizer and inhaler.

## 2017-06-13 NOTE — ED Provider Notes (Signed)
[facility]    CSN: [id number] Arrival date & time: 06/13/17  1122  History   Chief Complaint Chief Complaint  Patient presents with  . Cough   HPI  57 year old female with an extensive past medical history including asthma presents with cough, shortness of breath, and wheezing.  Patient reports that for the past several days to 1 week she has had ongoing cough, wheezing, and shortness of breath.  No fever.  She endorses compliance with her home as medications.  She is been using albuterol more frequently.  She states that this may have been caused by recent hotel stay.  No other associated symptoms.  No other points or concerns this time.  Past Medical History:  Diagnosis Date  . Allergic rhinitis   . Asthma without status asthmaticus   . Diabetes mellitus without complication (HCC)   . GERD (gastroesophageal reflux disease)   . Hyperlipidemia   . IGT (impaired glucose tolerance)   . Morbid obesity (HCC)   . Obesity   . Osteopenia   . Vertigo   . Vitamin D deficiency     Patient Active Problem List   Diagnosis Date Noted  . Asthma without status asthmaticus 11/16/2015  . IGT (impaired glucose tolerance) 11/16/2015  . URI (upper respiratory infection) 04/19/2015  . Morbid obesity (HCC) 08/15/2014  . Extrinsic asthma 06/14/2014    Past Surgical History:  Procedure Laterality Date  . ABDOMINAL HYSTERECTOMY  2001  . BREAST BIOPSY Bilateral    x2 1979/1993  . ESOPHAGOGASTRODUODENOSCOPY (EGD) WITH PROPOFOL N/A 01/02/2016   Procedure: ESOPHAGOGASTRODUODENOSCOPY (EGD) WITH PROPOFOL;  Surgeon: Midge Minium, MD;  Location: [facility] ENDOSCOPY;  Service: Endoscopy;  Laterality: N/A;  . GALLBLADDER SURGERY      OB History   None      Home Medications    Prior to Admission medications   Medication Sig Start Date End Date Taking? Authorizing Provider  albuterol (PROVENTIL HFA;VENTOLIN HFA) 108 (90 Base) MCG/ACT inhaler Inhale 1-2 puffs into the lungs every 6  (six) hours as needed for wheezing or shortness of breath. 05/17/17  Yes Domenick Gong, MD  albuterol (PROVENTIL) (2.5 MG/3ML) 0.083% nebulizer solution Take 3 mLs (2.5 mg total) by nebulization every 6 (six) hours as needed for wheezing or shortness of breath. 06/09/16  Yes Conty, Pamala Hurry, MD  fluticasone (FLONASE) 50 MCG/ACT nasal spray Place 2 sprays into both nostrils daily. 03/03/16  Yes Domenick Gong, MD  fluticasone furoate-vilanterol (BREO ELLIPTA) 100-25 MCG/INH AEPB Inhale 1 puff into the lungs daily. 11/28/16  Yes Erin Fulling, MD  ibuprofen (ADVIL,MOTRIN) 600 MG tablet Take 1 tablet (600 mg total) by mouth every 6 (six) hours as needed. 05/17/17  Yes Domenick Gong, MD  loratadine (CLARITIN) 10 MG tablet Take 10 mg by mouth daily.   Yes [provider]  omeprazole (PRILOSEC) 20 MG capsule Take 40 mg by mouth daily.    Yes [provider]  dimenhyDRINATE (DRAMAMINE) 50 MG tablet Take 50 mg by mouth every 8 (eight) hours as needed.    [provider]  predniSONE (DELTASONE) 50 MG tablet 1 tablet daily x 5 days. 06/13/17   Tommie Sams, DO    Family History Family History  Problem Relation Age of Onset  . Heart disease Mother   . Asthma Mother   . Diabetes Mother   . Stroke Father   . Breast cancer Paternal Grandmother 24    Social History Social History   Tobacco Use  . Smoking status: Former  Smoker    Packs/day: 0.50    Years: 8.00    Pack years: 4.00    Types: Cigarettes    Last attempt to quit: 2000    Years since quitting: 19.3  . Smokeless tobacco: Never Used  . Tobacco comment: quit smoking in 2000  Substance Use Topics  . Alcohol use: Yes    Alcohol/week: 0.0 oz    Comment: occasional glass of wine, last time friday  . Drug use: No     Allergies   Codeine   Review of Systems Review of Systems  Constitutional: Negative for fever.  Respiratory: Positive for cough, shortness of breath and wheezing.    Physical  Exam Triage Vital Signs ED Triage Vitals [06/13/17 1138]  Enc Vitals Group     BP (!) 153/83     Pulse Rate 81     Resp 16     Temp 98 F (36.7 C)     Temp Source Oral     SpO2 97 %     Weight (!) 313 lb (142 kg)     Height 5\' 7"  (1.702 m)     Head Circumference      Peak Flow      Pain Score 0     Pain Loc      Pain Edu?      Excl. in GC?    Updated Vital Signs BP (!) 153/83 (BP Location: Left Arm) Comment: lg cuff  Pulse 81   Temp 98 F (36.7 C) (Oral)   Resp 16   Ht 5\' 7"  (1.702 m)   Wt (!) 313 lb (142 kg)   LMP 07/18/2000 (Approximate)   SpO2 97%   BMI 49.02 kg/m    Physical Exam  Constitutional: She is oriented to person, place, and time. She appears well-developed. No distress.  HENT:  Head: Normocephalic and atraumatic.  Mouth/Throat: Oropharynx is clear and moist.  Eyes: Conjunctivae are normal. Right eye exhibits no discharge. Left eye exhibits no discharge.  Cardiovascular: Normal rate and regular rhythm.  Pulmonary/Chest: Effort normal.  Diffuse expiratory wheezing.  Neurological: She is alert and oriented to person, place, and time.  Psychiatric: She has a normal mood and affect. Her behavior is normal.  Nursing note and vitals reviewed.  UC Treatments / Results  Labs (all labs ordered are listed, but only abnormal results are displayed) Labs Reviewed - No data to display  EKG None Radiology No results found.  Procedures Procedures (including critical care time)  Medications Ordered in UC Medications - No data to display   Initial Impression / Assessment and Plan / UC Course  I have reviewed the triage vital signs and the nursing notes.  Pertinent labs & imaging results that were available during my care of the patient were reviewed by me and considered in my medical decision making (see chart for details).     57 year old female presents with an asthma exacerbation.  Treating with prednisone.  Advised continue use of home inhalers.   Advised to see pulmonology sooner for follow-up  Final Clinical Impressions(s) / UC Diagnoses   Final diagnoses:  Moderate persistent asthma with acute exacerbation    ED Discharge Orders        Ordered    predniSONE (DELTASONE) 50 MG tablet     06/13/17 1206     Controlled Substance Prescriptions [place] Controlled Substance Registry consulted? Not Applicable   Tommie SamsCook, Shamika Pedregon G, DO 06/13/17 1251

## 2017-08-25 ENCOUNTER — Other Ambulatory Visit: Payer: Self-pay

## 2017-08-25 ENCOUNTER — Ambulatory Visit: Payer: Self-pay | Attending: Oncology

## 2017-08-25 ENCOUNTER — Ambulatory Visit
Admission: RE | Admit: 2017-08-25 | Discharge: 2017-08-25 | Disposition: A | Discharge status: Home / Self Care | Payer: Self-pay | Source: Ambulatory Visit | Attending: Oncology | Admitting: Oncology

## 2017-08-25 VITALS — Ht 67.0 in | Wt 304.0 lb

## 2017-08-25 DIAGNOSIS — Z0000 Encounter for general adult medical examination without abnormal findings: Secondary | ICD-10-CM

## 2017-08-25 NOTE — Progress Notes (Signed)
  Subjective:     Patient ID: Kimberly Vasquez, female   DOB: 1961/01/13, 57 y.o.   MRN: [id number]  HPI   Review of Systems     Objective:   Physical Exam  Pulmonary/Chest: Right breast exhibits no inverted nipple, no mass, no nipple discharge, no skin change and no tenderness. Left breast exhibits no inverted nipple, no mass, no nipple discharge, no skin change and no tenderness. Breasts are symmetrical.       Assessment:     57 year old patient returns for annual [facility] clinic visit.  Patient screened, and meets B[facility]CP eligibility.  Patient does not have insurance, Medicare or Medicaid.  Handout given on Affordable Care Act.  Instructed patient on breast self awareness using teach back method.  Clinical breast exam unremarkable.  No mass or lump palpated.  Patient has seven grandchildren.      Plan:     Sent for bilateral screening mammogram.

## 2017-08-27 NOTE — Progress Notes (Signed)
Letter mailed from [facility] to notify of normal mammogram results.  Patient to return in one year for annual screening.  Copy to [facility]. 

## 2017-09-28 ENCOUNTER — Ambulatory Visit
Admission: EM | Admit: 2017-09-28 | Discharge: 2017-09-28 | Disposition: A | Discharge status: Home / Self Care | Payer: Self-pay | Attending: Family Medicine | Admitting: Family Medicine

## 2017-09-28 ENCOUNTER — Other Ambulatory Visit: Payer: Self-pay

## 2017-09-28 DIAGNOSIS — E119 Type 2 diabetes mellitus without complications: Secondary | ICD-10-CM | POA: Insufficient documentation

## 2017-09-28 DIAGNOSIS — S46912A Strain of unspecified muscle, fascia and tendon at shoulder and upper arm level, left arm, initial encounter: Secondary | ICD-10-CM | POA: Insufficient documentation

## 2017-09-28 DIAGNOSIS — J45909 Unspecified asthma, uncomplicated: Secondary | ICD-10-CM | POA: Insufficient documentation

## 2017-09-28 DIAGNOSIS — Z87891 Personal history of nicotine dependence: Secondary | ICD-10-CM | POA: Insufficient documentation

## 2017-09-28 DIAGNOSIS — K219 Gastro-esophageal reflux disease without esophagitis: Secondary | ICD-10-CM | POA: Insufficient documentation

## 2017-09-28 DIAGNOSIS — X58XXXA Exposure to other specified factors, initial encounter: Secondary | ICD-10-CM | POA: Insufficient documentation

## 2017-09-28 DIAGNOSIS — E559 Vitamin D deficiency, unspecified: Secondary | ICD-10-CM | POA: Insufficient documentation

## 2017-09-28 DIAGNOSIS — Z79899 Other long term (current) drug therapy: Secondary | ICD-10-CM | POA: Insufficient documentation

## 2017-09-28 NOTE — Discharge Instructions (Signed)
Recommend anti-inflammatories, range of motion exercises, ice, tylenol as needed

## 2017-09-28 NOTE — ED Triage Notes (Signed)
Pt reports started with left arm pain during the night. Has had nausea since awaking today. Pt recently diagnosed with sleep apnea and has sinus issues so not sure if this is contributing. Denies Chest pain.

## 2017-09-28 NOTE — ED Provider Notes (Signed)
[facility]    CSN: [id number] Arrival date & time: 09/28/17  [id number]     History   Chief Complaint Chief Complaint  Patient presents with  . Arm Pain    HPI Max FickleRobin Taylor Breunig is a 57 y.o. female.   57 yo female with a c/o left arm pain since this morning. States pain is both dull and intermittently sharp as well as worse with movement or positioning. States thinks she "may have slept funny". Denies any chest pains, jaw pain, neck pain, shortness of breath, pain radiating, numbness/tingling.   The history is provided by the patient.  Arm Pain     Past Medical History:  Diagnosis Date  . Allergic rhinitis   . Asthma without status asthmaticus   . Diabetes mellitus without complication (HCC)   . GERD (gastroesophageal reflux disease)   . Hyperlipidemia   . IGT (impaired glucose tolerance)   . Morbid obesity (HCC)   . Obesity   . Osteopenia   . Vertigo   . Vitamin D deficiency     Patient Active Problem List   Diagnosis Date Noted  . Asthma without status asthmaticus 11/16/2015  . IGT (impaired glucose tolerance) 11/16/2015  . URI (upper respiratory infection) 04/19/2015  . Morbid obesity (HCC) 08/15/2014  . Extrinsic asthma 06/14/2014    Past Surgical History:  Procedure Laterality Date  . ABDOMINAL HYSTERECTOMY  2001  . BREAST BIOPSY Bilateral    x2 1979/1993  . ESOPHAGOGASTRODUODENOSCOPY (EGD) WITH PROPOFOL N/A 01/02/2016   Procedure: ESOPHAGOGASTRODUODENOSCOPY (EGD) WITH PROPOFOL;  Surgeon: Midge Miniumarren Wohl, MD;  Location: [facility] ENDOSCOPY;  Service: Endoscopy;  Laterality: N/A;  . GALLBLADDER SURGERY      OB History   None      Home Medications    Prior to Admission medications   Medication Sig Start Date End Date Taking? Authorizing Provider  albuterol (PROVENTIL HFA;VENTOLIN HFA) 108 (90 Base) MCG/ACT inhaler Inhale 1-2 puffs into the lungs every 6 (six) hours as needed for wheezing or shortness of breath. 05/17/17   Domenick GongMortenson, Ashley, MD    albuterol (PROVENTIL) (2.5 MG/3ML) 0.083% nebulizer solution Take 3 mLs (2.5 mg total) by nebulization every 6 (six) hours as needed for wheezing or shortness of breath. 06/09/16   Payton Mccallumonty, Rishi Vicario, MD  dimenhyDRINATE (DRAMAMINE) 50 MG tablet Take 50 mg by mouth every 8 (eight) hours as needed.    [provider]  fluticasone (FLONASE) 50 MCG/ACT nasal spray Place 2 sprays into both nostrils daily. 03/03/16   Domenick GongMortenson, Ashley, MD  fluticasone furoate-vilanterol (BREO ELLIPTA) 100-25 MCG/INH AEPB Inhale 1 puff into the lungs daily. 11/28/16   Erin FullingKasa, Kurian, MD  ibuprofen (ADVIL,MOTRIN) 600 MG tablet Take 1 tablet (600 mg total) by mouth every 6 (six) hours as needed. 05/17/17   Domenick GongMortenson, Ashley, MD  loratadine (CLARITIN) 10 MG tablet Take 10 mg by mouth daily.    [provider]  omeprazole (PRILOSEC) 20 MG capsule Take 40 mg by mouth daily.     [provider]  predniSONE (DELTASONE) 50 MG tablet 1 tablet daily x 5 days. 06/13/17   Tommie Samsook, Jayce G, DO    Family History Family History  Problem Relation Age of Onset  . Heart disease Mother   . Asthma Mother   . Diabetes Mother   . Stroke Father   . Breast cancer Paternal Grandmother [id number]    Social History Social History   Tobacco Use  . Smoking status: Former Smoker    Packs/day: 0.50  Years: 8.00    Pack years: 4.00    Types: Cigarettes    Last attempt to quit: 2000    Years since quitting: 19.6  . Smokeless tobacco: Never Used  . Tobacco comment: quit smoking in 2000  Substance Use Topics  . Alcohol use: Yes    Alcohol/week: 0.0 standard drinks    Comment: occasional glass of wine, last time friday  . Drug use: No     Allergies   Codeine   Review of Systems Review of Systems   Physical Exam Triage Vital Signs ED Triage Vitals  Enc Vitals Group     BP 09/28/17 0928 (!) 158/83     Pulse Rate 09/28/17 0928 80     Resp 09/28/17 0928 20     Temp 09/28/17 0928 97.6 F (36.4 C)     Temp Source  09/28/17 0928 Oral     SpO2 09/28/17 0928 95 %     Weight 09/28/17 0934 (!) 312 lb (141.5 kg)     Height 09/28/17 0934 5' 7.5" (1.715 m)     Head Circumference --      Peak Flow --      Pain Score 09/28/17 0934 3     Pain Loc --      Pain Edu? --      Excl. in GC? --    No data found.  Updated Vital Signs BP (!) 158/83 (BP Location: Right Arm)   Pulse 80   Temp 97.6 F (36.4 C) (Oral)   Resp 20   Ht 5' 7.5" (1.715 m)   Wt (!) 141.5 kg   LMP 07/18/2000 (Approximate)   SpO2 95%   BMI 48.14 kg/m   Visual Acuity Right Eye Distance:   Left Eye Distance:   Bilateral Distance:    Right Eye Near:   Left Eye Near:    Bilateral Near:     Physical Exam  Constitutional: She appears well-developed and well-nourished. No distress.  Neck: Neck supple. No tracheal deviation present.  Cardiovascular: Normal rate, regular rhythm, normal heart sounds and intact distal pulses.  No murmur heard. Pulmonary/Chest: Effort normal and breath sounds normal. No stridor. No respiratory distress. She has no wheezes. She has no rales.  Musculoskeletal:       Left shoulder: She exhibits tenderness and pain. She exhibits normal range of motion, no bony tenderness, no swelling, no effusion, no crepitus, no deformity, no laceration, no spasm, normal pulse and normal strength.  Skin: She is not diaphoretic.  Nursing note and vitals reviewed.    UC Treatments / Results  Labs (all labs ordered are listed, but only abnormal results are displayed) Labs Reviewed - No data to display  EKG None  Radiology No results found.  Procedures ED EKG Date/Time: 09/28/2017 12:01 PM Performed by: Payton Mccallum, MD Authorized by: Payton Mccallum, MD   ECG reviewed by ED Physician in the absence of a cardiologist: yes   Previous ECG:    Previous ECG:  Compared to current   Similarity:  No change Interpretation:    Interpretation: normal   Rate:    ECG rate:  82   ECG rate assessment: normal     Rhythm:    Rhythm: sinus rhythm   Ectopy:    Ectopy: none   QRS:    QRS axis:  Normal Conduction:    Conduction: normal   ST segments:    ST segments:  Normal T waves:    T waves: normal     (  including critical care time)  Medications Ordered in UC Medications - No data to display  Initial Impression / Assessment and Plan / UC Course  I have reviewed the triage vital signs and the nursing notes.  Pertinent labs & imaging results that were available during my care of the patient were reviewed by me and considered in my medical decision making (see chart for details).      Final Clinical Impressions(s) / UC Diagnoses   Final diagnoses:  Strain of left shoulder, initial encounter    ED Prescriptions    None     1. ekg results and diagnosis reviewed with patient 2. Recommend supportive treatment with otc anti-inflammatories/analgesics, ice/heat, range of motion exercises 4. Follow-up prn if symptoms worsen or don't improve   Controlled Substance Prescriptions [place] Controlled Substance Registry consulted? Not Applicable   Payton Mccallum, MD 09/28/17 1218

## 2017-10-28 ENCOUNTER — Ambulatory Visit: Payer: Self-pay | Admitting: Internal Medicine

## 2017-12-09 ENCOUNTER — Ambulatory Visit: Payer: Self-pay | Admitting: Internal Medicine

## 2017-12-09 ENCOUNTER — Encounter: Payer: Self-pay | Admitting: Internal Medicine

## 2017-12-09 VITALS — BP 110/78 | HR 90 | Resp 16 | Ht 67.5 in | Wt 304.0 lb

## 2017-12-09 DIAGNOSIS — J4520 Mild intermittent asthma, uncomplicated: Secondary | ICD-10-CM

## 2017-12-09 DIAGNOSIS — G4733 Obstructive sleep apnea (adult) (pediatric): Secondary | ICD-10-CM

## 2017-12-09 DIAGNOSIS — Z23 Encounter for immunization: Secondary | ICD-10-CM

## 2017-12-09 NOTE — Patient Instructions (Signed)
Hold BREO for now and assess ASTHMA  Follow up with ENT for Sleep Apnea  FLU SHOT TODAY  ALBUTEROL AS NEEDED

## 2017-12-09 NOTE — Progress Notes (Signed)
[facility] Pulmonary Medicine Consultation      MRN# [id number] Kimberly Vasquez 1960-04-03   CC: Follow-up asthma  HPI   Patient doing well on Breo 100 daily  No acute signs of exacerbation at this time  No indication for steroids at this time   Infrequent use of albuterol in the last several months  No signs of infection at this time   Patient has been started on CPAP therapy for sleep apnea diagnosed in September of this year  I have no compliance report at this time due to the fact that she sees Dr. Carlus Pavlov with ENT for her sleep apnea   Her GERD seems to be under control at this time  Patient has allergic rhinitis on Nasacort According to patient ENT suggest that patient has severe sinus disease which is causing her asthma-like symptoms Patient states she is doing really well on her CPAP      Review of Systems  Constitutional: Negative for chills, fever, malaise/fatigue and weight loss.  HENT: Negative for congestion and sore throat.   Respiratory: Negative for cough, hemoptysis, sputum production, shortness of breath and wheezing.   Cardiovascular: Negative for chest pain, palpitations, orthopnea, claudication and leg swelling.  Gastrointestinal: Positive for heartburn. Negative for abdominal pain, diarrhea, nausea and vomiting.      Allergies:  Codeine  Physical Examination:  VS: BP 110/78 (BP Location: Left Arm, Cuff Size: Large)   Pulse 90   Resp 16   Ht 5' 7.5" (1.715 m)   Wt (!) 304 lb (137.9 kg)   LMP 07/18/2000 (Approximate)   SpO2 96%   BMI 46.91 kg/m    Physical Examination:   GENERAL:NAD, no fevers, chills, no weakness no fatigue HEAD: Normocephalic, atraumatic.  EYES: Pupils equal, round, reactive to light. Extraocular muscles intact. No scleral icterus.  MOUTH: Moist mucosal membrane. Dentition intact. No abscess noted.  EAR, NOSE, THROAT: Clear without exudates. No external lesions.  NECK: Supple. No thyromegaly. No nodules. No JVD.   PULMONARY: CTA B/L no wheezing, rhonchi, crackles CARDIOVASCULAR: S1 and S2. Regular rate and rhythm. No murmurs, rubs, or gallops. No edema. Pedal pulses 2+ bilaterally.  GASTROINTESTINAL: Soft, nontender, nondistended. No masses. Positive bowel sounds. No hepatosplenomegaly.  MUSCULOSKELETAL: No swelling, clubbing, or edema. Range of motion full in all extremities.  NEUROLOGIC: Cranial nerves II through XII are intact. No gross focal neurological deficits. Sensation intact. Reflexes intact.  SKIN: No ulceration, lesions, rashes, or cyanosis. Skin warm and dry. Turgor intact.  PSYCHIATRIC: Mood, affect within normal limits. The patient is awake, alert and oriented x 3. Insight, judgment intact.  ALL OTHER ROS ARE NEGATIVE       Assessment and Plan:  57 year old morbidly obese white female with mild intermittent uncomplicated asthma is well controlled with Breo at this time with underlying reflux disease and sleep apnea  #1 asthma well-controlled at this time No signs of exacerbation at this time No indication for steroids at this time I have advised patient to hold Breo and to assess her respiratory status as she is being treated for her sleep apnea Will hold Breo for 2 weeks and see what happens to her respiratory status Continue albuterol MDI and neb as needed  #2 sleep apnea Patient is followed by ENT Continue to follow-up as needed I have suggested to patient that if she would like for me to follow-up for sleep apnea slight me now  #3 GERD Continue PPI as prescribed  #4 Obesity -recommend significant weight loss -  recommend changing diet  5 deconditioned state -Recommend increased daily activity and exercise    Follow up  in 1 year    Patient satisfied with Plan of action and management. All questions answered  Lucie Leather, M.D.  [facility]  Medical Director [facility] Medical Director [facility] Cardio-Pulmonary  Department

## 2018-01-19 ENCOUNTER — Telehealth: Payer: Self-pay | Admitting: Internal Medicine

## 2018-01-19 NOTE — Telephone Encounter (Signed)
Spoke to patient, she is going to restart Breo and see if that helps. If it does not, she is going to make an apt. Will drop off financial forms tomorrow for Dr. Belia HemanKasa to fill out.

## 2018-01-19 NOTE — Telephone Encounter (Signed)
Pt c/o Shortness Of Breath: STAT if SOB developed within the last 24 hours or pt is noticeably SOB on the phone  1. Are you currently SOB (can you hear that pt is SOB on the phone)? Yes starting to wheeze a bit   2. How long have you been experiencing SOB? 2 days   3. Are you SOB when sitting or when up moving around? Moving around gets winded and wheezing   4. Are you currently experiencing any other symptoms? Wants to restart inhaler - breo and will bring medication assistance forms for kasa to sign will drop off

## 2018-01-23 ENCOUNTER — Telehealth: Payer: Self-pay | Admitting: *Deleted

## 2018-01-23 NOTE — Telephone Encounter (Signed)
Left message lifeline rx ready for pick-up Office closed at check-in at noon today. She may pick up from pulmonary or Monday. Nothing further needed.

## 2018-02-04 ENCOUNTER — Telehealth: Payer: Self-pay | Admitting: Internal Medicine

## 2018-02-04 ENCOUNTER — Other Ambulatory Visit: Payer: Self-pay | Admitting: Internal Medicine

## 2018-02-04 MED ORDER — FLUTICASONE FUROATE-VILANTEROL 100-25 MCG/INH IN AEPB: 1.0000 | INHALATION_SPRAY | Freq: Once | RESPIRATORY_TRACT | 11 refills | Status: AC

## 2018-02-04 NOTE — Telephone Encounter (Signed)
Returned call to patient and made aware RX printed and once signed by Dr. Belia HemanKasa we will fax. Rx placed in DK folder for signature. Pt provided fax # [phone].

## 2018-02-23 ENCOUNTER — Encounter: Payer: Self-pay | Admitting: Emergency Medicine

## 2018-02-23 ENCOUNTER — Other Ambulatory Visit: Payer: Self-pay

## 2018-02-23 ENCOUNTER — Ambulatory Visit
Admission: EM | Admit: 2018-02-23 | Discharge: 2018-02-23 | Disposition: A | Discharge status: Home / Self Care | Payer: Self-pay | Attending: Family Medicine | Admitting: Family Medicine

## 2018-02-23 DIAGNOSIS — J45901 Unspecified asthma with (acute) exacerbation: Secondary | ICD-10-CM

## 2018-02-23 DIAGNOSIS — J0140 Acute pansinusitis, unspecified: Secondary | ICD-10-CM

## 2018-02-23 DIAGNOSIS — J4521 Mild intermittent asthma with (acute) exacerbation: Secondary | ICD-10-CM | POA: Insufficient documentation

## 2018-02-23 DIAGNOSIS — Z87891 Personal history of nicotine dependence: Secondary | ICD-10-CM

## 2018-02-23 MED ORDER — PREDNISONE 10 MG PO TABS: ORAL_TABLET | ORAL | 0 refills | Status: DC

## 2018-02-23 MED ORDER — DOXYCYCLINE HYCLATE 100 MG PO CAPS: 100.0000 mg | ORAL_CAPSULE | Freq: Two times a day (BID) | ORAL | 0 refills | Status: DC

## 2018-02-23 MED ORDER — BENZONATATE 100 MG PO CAPS
100.0000 mg | ORAL_CAPSULE | Freq: Three times a day (TID) | ORAL | 0 refills | Status: DC | PRN
Start: 2018-02-23 — End: 2020-01-21

## 2018-02-23 NOTE — ED Provider Notes (Signed)
[facility] ____________________________________________  Time seen: Approximately 2:17 PM  I have reviewed the triage vital signs and the nursing notes.   HISTORY  Chief Complaint URI (APPT) and Wheezing   HPI Kimberly Vasquez is a 58 y.o. female has medical history of allergies and asthma, presenting for evaluation of just over 1 week of nasal congestion, cough, postnasal drainage and intermittent wheezing.  Has used her home albuterol treatments of inhaler and nebulizer with some improvement of wheezing.  Cough does intermittently disrupt sleep.  Denies any accompanying fevers.  Continues with nasal congestion, sinus pain and sinus pressure.  Has also taken some over-the-counter cough and congestion medication without much improvement.  Denies other aggravating alleviating factors.  Denies accompanying chest pain.  Does have some intermittent shortness of breath associated with chest tightness with cough and wheezing.  Denies any current shortness of breath.  No hemoptysis.  Denies recent sickness.  No recent prednisone use.  Reports otherwise doing well.   Past Medical History:  Diagnosis Date  . Allergic rhinitis   . Asthma without status asthmaticus   . Diabetes mellitus without complication (HCC)   . GERD (gastroesophageal reflux disease)   . Hyperlipidemia   . IGT (impaired glucose tolerance)   . Morbid obesity (HCC)   . Obesity   . Osteopenia   . Vertigo   . Vitamin D deficiency     Patient Active Problem List   Diagnosis Date Noted  . Asthma without status asthmaticus 11/16/2015  . IGT (impaired glucose tolerance) 11/16/2015  . Gastroesophageal reflux disease 11/14/2015  . Snoring 11/14/2015  . URI (upper respiratory infection) 04/19/2015  . Morbid obesity (HCC) 08/15/2014  . Extrinsic asthma 06/14/2014    Past Surgical History:  Procedure Laterality Date  . ABDOMINAL HYSTERECTOMY  2001  . BREAST BIOPSY Bilateral    x2 1979/1993  .  ESOPHAGOGASTRODUODENOSCOPY (EGD) WITH PROPOFOL N/A 01/02/2016   Procedure: ESOPHAGOGASTRODUODENOSCOPY (EGD) WITH PROPOFOL;  Surgeon: Midge Minium, MD;  Location: [facility] ENDOSCOPY;  Service: Endoscopy;  Laterality: N/A;  . GALLBLADDER SURGERY       No current facility-administered medications for this encounter.   Current Outpatient Medications:  .  albuterol (PROVENTIL HFA;VENTOLIN HFA) 108 (90 Base) MCG/ACT inhaler, Inhale 1-2 puffs into the lungs every 6 (six) hours as needed for wheezing or shortness of breath., Disp: 1 Inhaler, Rfl: 0 .  albuterol (PROVENTIL) (2.5 MG/3ML) 0.083% nebulizer solution, Take 3 mLs (2.5 mg total) by nebulization every 6 (six) hours as needed for wheezing or shortness of breath., Disp: 75 mL, Rfl: 0 .  fluticasone furoate-vilanterol (BREO ELLIPTA) 100-25 MCG/INH AEPB, Inhale 1 puff into the lungs daily., Disp: 3 each, Rfl: 3 .  loratadine (CLARITIN) 10 MG tablet, Take 10 mg by mouth daily., Disp: , Rfl:  .  omeprazole (PRILOSEC) 20 MG capsule, Take 40 mg by mouth daily. , Disp: , Rfl:  .  triamcinolone (NASACORT ALLERGY 24HR) 55 MCG/ACT AERO nasal inhaler, Place 2 sprays into the nose daily., Disp: , Rfl:  .  benzonatate (TESSALON PERLES) 100 MG capsule, Take 1 capsule (100 mg total) by mouth 3 (three) times daily as needed., Disp: 15 capsule, Rfl: 0 .  doxycycline (VIBRAMYCIN) 100 MG capsule, Take 1 capsule (100 mg total) by mouth 2 (two) times daily., Disp: 20 capsule, Rfl: 0 .  predniSONE (DELTASONE) 10 MG tablet, Start 60 mg po day one, then 50 mg po day two, taper by 10 mg daily until complete., Disp: 21 tablet, Rfl: 0  Allergies Codeine  Family History  Problem Relation Age of Onset  . Heart disease Mother   . Asthma Mother   . Diabetes Mother   . Stroke Father   . Breast cancer Paternal Grandmother [id number]    Social History Social History   Tobacco Use  . Smoking status: Former Smoker    Packs/day: 0.50    Years: 8.00    Pack years: 4.00    Types:  Cigarettes    Last attempt to quit: 2000    Years since quitting: 20.0  . Smokeless tobacco: Never Used  . Tobacco comment: quit smoking in 2000  Substance Use Topics  . Alcohol use: Yes    Alcohol/week: 0.0 standard drinks    Comment: occasional glass of wine, last time friday  . Drug use: No    Review of Systems Constitutional: No fever ENT: as above.  Some intermittent sore throat. Cardiovascular: Denies chest pain. Respiratory: As above. Gastrointestinal: No abdominal pain.  Musculoskeletal: Negative for back pain. Skin: Negative for rash.   ____________________________________________   PHYSICAL EXAM:  VITAL SIGNS: ED Triage Vitals  Enc Vitals Group     BP 02/23/18 1119 119/71     Pulse Rate 02/23/18 1119 80     Resp 02/23/18 1119 18     Temp 02/23/18 1119 98.6 F (37 C)     Temp Source 02/23/18 1119 Oral     SpO2 02/23/18 1119 97 %     Weight 02/23/18 1117 (!) 304 lb (137.9 kg)     Height 02/23/18 1117 5\' 7"  (1.702 m)     Head Circumference --      Peak Flow --      Pain Score 02/23/18 1117 0     Pain Loc --      Pain Edu? --      Excl. in GC? --     Constitutional: Alert and oriented. Well appearing and in no acute distress. Eyes: Conjunctivae are normal.  Head: Atraumatic.Mild to moderate tenderness to palpation bilateral frontal and maxillary sinuses. No swelling. No erythema.   Ears: no erythema, normal TMs bilaterally.   Nose: nasal congestion with bilateral nasal turbinate erythema and edema.   Mouth/Throat: Mucous membranes are moist.  Oropharynx non-erythematous.No tonsillar swelling or exudate.  Neck: No stridor.  No cervical spine tenderness to palpation. Hematological/Lymphatic/Immunilogical: No cervical lymphadenopathy. Cardiovascular: Normal rate, regular rhythm. Grossly normal heart sounds.  Good peripheral circulation. Respiratory: Normal respiratory effort.  No retractions. Good air movement.  Inspiratory and expiratory mild wheezes  throughout.  No rhonchi.  Occasional dry cough noted room with bronchospasm.  Speaks in complete sentences. Musculoskeletal: Steady gait.  No lower extremity edema noted bilaterally. Neurologic:  Normal speech and language.No gait instability. Skin:  Skin is warm, dry and intact. No rash noted. Psychiatric: Mood and affect are normal. Speech and behavior are normal. _________________________________________   LABS (all labs ordered are listed, but only abnormal results are displayed)  Labs Reviewed - No data to display   PROCEDURES Procedures    INITIAL IMPRESSION / ASSESSMENT AND PLAN / ED COURSE  Pertinent labs & imaging results that were available during my care of the patient were reviewed by me and considered in my medical decision making (see chart for details).  Well-appearing patient.  No acute distress.  Suspect recent upper respiratory infection with asthma exacerbation.  Also suspect sinusitis.  Will treat patient with oral doxycycline, prednisone taper, PRN Tessalon Perles.  Encourage rest, fluids, supportive care. Continue  home albuterol breathing treatments.Discussed indication, risks and benefits of medications with patient.  Discussed follow up with Primary care physician this week. Discussed follow up and return parameters including no resolution or any worsening concerns. Patient verbalized understanding and agreed to plan.   ____________________________________________   FINAL CLINICAL IMPRESSION(S) / ED DIAGNOSES  Final diagnoses:  Acute pansinusitis, recurrence not specified  Exacerbation of intermittent asthma, unspecified asthma severity     ED Discharge Orders         Ordered    predniSONE (DELTASONE) 10 MG tablet     02/23/18 1208    benzonatate (TESSALON PERLES) 100 MG capsule  3 times daily PRN     02/23/18 1208    doxycycline (VIBRAMYCIN) 100 MG capsule  2 times daily     02/23/18 1208           Note: This dictation was prepared with  Dragon dictation along with smaller phrase technology. Any transcriptional errors that result from this process are unintentional.         Renford Dills, NP 02/23/18 1421

## 2018-02-23 NOTE — Discharge Instructions (Addendum)
Take medication as prescribed. Rest. Drink plenty of fluids. Continue home treatments.   Follow up with your primary care physician this week as needed. Return to Urgent care for new or worsening concerns.

## 2018-02-23 NOTE — ED Triage Notes (Signed)
Patient c/o cough and wheezing that started 1 week ago. She states she has been taking her inhalers as needed and used her nebulizer x 1 yesterday.

## 2018-05-21 NOTE — Telephone Encounter (Signed)
Nothing further needed 

## 2018-10-27 ENCOUNTER — Other Ambulatory Visit: Payer: Self-pay

## 2018-10-27 ENCOUNTER — Ambulatory Visit
Admission: RE | Admit: 2018-10-27 | Discharge: 2018-10-27 | Disposition: A | Discharge status: Home / Self Care | Payer: Self-pay | Source: Ambulatory Visit | Attending: Oncology | Admitting: Oncology

## 2018-10-27 ENCOUNTER — Ambulatory Visit: Payer: Self-pay | Attending: Oncology

## 2018-10-27 VITALS — BP 158/91 | HR 75 | Temp 96.6°F | Ht 67.5 in | Wt 326.0 lb

## 2018-10-27 DIAGNOSIS — Z0000 Encounter for general adult medical examination without abnormal findings: Secondary | ICD-10-CM | POA: Insufficient documentation

## 2018-10-27 NOTE — Progress Notes (Signed)
  Subjective:     Patient ID: Kimberly Vasquez, female   DOB: February 27, 1960, 58 y.o.   MRN: [id number]  HPI   Review of Systems     Objective:   Physical Exam Chest:     Breasts:        Right: No swelling, bleeding, inverted nipple, mass, nipple discharge, skin change or tenderness.        Left: No swelling, bleeding, inverted nipple, mass, nipple discharge, skin change or tenderness.     Comments: Nipple inversion when supine       Assessment:     58 year old patient returns for [facility] clinic visit. Patient screened, and meets BCCCP eligibility.  Patient does not have insurance, Medicare or Medicaid. Instructed patient on breast self awareness using teach back method.  Clinical breast exam unremarkable. No mass or lump palpated.  Recheck blood pressure 151/83.  Patient states she is going to contact one of the [facility] to initiate primary care.   Risk Assessment    No risk assessment data for the current encounter   Risk Scores      10/23/2018   Last edited by: Orson Slick, CMA   5-year risk: 2 %   Lifetime risk: 10.9 %             Plan:     Sent for bilateral screening mammogram.

## 2018-10-28 NOTE — Progress Notes (Signed)
Letter mailed from [facility] to notify of normal mammogram results.  Patient to return in one year for annual screening.  Copy to [facility]. 

## 2019-01-28 ENCOUNTER — Encounter: Payer: Self-pay | Admitting: Internal Medicine

## 2019-01-28 ENCOUNTER — Ambulatory Visit (INDEPENDENT_AMBULATORY_CARE_PROVIDER_SITE_OTHER): Payer: Self-pay | Admitting: Internal Medicine

## 2019-01-28 DIAGNOSIS — K219 Gastro-esophageal reflux disease without esophagitis: Secondary | ICD-10-CM

## 2019-01-28 DIAGNOSIS — J4520 Mild intermittent asthma, uncomplicated: Secondary | ICD-10-CM

## 2019-01-28 DIAGNOSIS — G4733 Obstructive sleep apnea (adult) (pediatric): Secondary | ICD-10-CM

## 2019-01-28 MED ORDER — ALBUTEROL SULFATE (2.5 MG/3ML) 0.083% IN NEBU: 2.5000 mg | INHALATION_SOLUTION | Freq: Four times a day (QID) | RESPIRATORY_TRACT | 6 refills | Status: DC | PRN

## 2019-01-28 MED ORDER — ALBUTEROL SULFATE HFA 108 (90 BASE) MCG/ACT IN AERS: 1.0000 | INHALATION_SPRAY | Freq: Four times a day (QID) | RESPIRATORY_TRACT | 6 refills | Status: AC | PRN

## 2019-01-28 NOTE — Progress Notes (Signed)
  [facility] Consultation     I connected with the patient by telephone enabled telemedicine visit and verified that I am speaking with the correct person using two identifiers.    I discussed the limitations, risks, security and privacy concerns of performing an evaluation and management service by telemedicine and the availability of in-person appointments. I also discussed with the patient that there may be a patient responsible charge related to this service. The patient expressed understanding and agreed to proceed.  PATIENT AGREES AND CONFIRMS -YES   Other persons participating in the visit and their role in the encounter: Patient, nursing  This visit type was conducted due to national recommendations for restrictions regarding the COVID-19 Pandemic (e.g. social distancing).  This format is felt to be most appropriate for this patient at this time.  All issues noted in this document were discussed and addressed.        MRN# [id number] Kimberly Vasquez 26-Dec-1960   CC FOLLOW UP ASTHMA  HPI   No  exacerbation at this time No evidence of heart failure at this time No evidence or signs of infection at this time No respiratory distress No fevers, chills, nausea, vomiting, diarrhea No evidence of lower extremity edema No evidence hemoptysis  ON BREO 100 DAILY ALBUTEROL INH USED INFREQUENTLY  ON CPAP FOR OSA SEES ENT FOR THIS  GERD UNDER CONTROL       Review of Systems  Constitutional: Negative for chills, fever, malaise/fatigue and weight loss.  HENT: Negative for congestion and sore throat.   Respiratory: Negative for cough, hemoptysis, sputum production, shortness of breath and wheezing.   Cardiovascular: Negative for chest pain, palpitations, orthopnea, claudication and leg swelling.  Gastrointestinal: Positive for heartburn. Negative for abdominal pain, diarrhea, nausea and vomiting.  All other systems reviewed and are  negative.     Allergies:  Codeine  Physical Examination:  VS: LMP 07/18/2000 (Approximate)       Assessment and Plan:  asthma well-controlled at this time, MILD INTERMITTENT CONTINUE BREO AND ALBUTEROL AS NEEDED AVOID TRIGGERS   sleep apnea PATIENT TO SWITCH OVER CARE TO US OBTAIN COMPLIANCE REPORT  GERD CONTINUE PPI  Obesity -recommend significant weight loss -recommend changing diet  Deconditioned state -Recommend increased daily activity and exercise    COVID-19 EDUCATION: The signs and symptoms of COVID-19 were discussed with the patient and how to seek care for testing.  The importance of social distancing was discussed today. Hand Washing Techniques and avoid touching face was advised.     MEDICATION ADJUSTMENTS/LABS AND TESTS ORDERED: CONTINUE BREO  AVOID TRIGGERS OBTAIN COMPLIANCE REPORT FOR OSA  CURRENT MEDICATIONS REVIEWED AT LENGTH WITH PATIENT TODAY   Patient satisfied with Plan of action and management. All questions answered  Follow up in [place], M.D.  [facility]  Medical Director [place] Director [facility] Cardio-Pulmonary Department

## 2019-01-28 NOTE — Patient Instructions (Signed)
  MEDICATION ADJUSTMENTS/LABS AND TESTS ORDERED: CONTINUE BREO  AVOID TRIGGERS OBTAIN COMPLIANCE REPORT FOR OSA

## 2019-01-29 ENCOUNTER — Telehealth: Payer: Self-pay | Admitting: Internal Medicine

## 2019-01-29 NOTE — Telephone Encounter (Signed)
Paper work has been placed in Dr. Kasa's folder for signature.  

## 2019-02-04 NOTE — Telephone Encounter (Signed)
Pt called to check status. Told patient that Dr. Mortimer Fries has been working in ICU and will fill out once he is back in the office. Pt voiced understanding and will call back next week to check again. BL

## 2019-02-09 NOTE — Telephone Encounter (Signed)
Forms can be located in [facility] folder.  Pt request that forms be completed ASAP.   Will route to DK to make aware.

## 2019-02-15 MED ORDER — FLUTICASONE FUROATE-VILANTEROL 100-25 MCG/INH IN AEPB: 1.0000 | INHALATION_SPRAY | Freq: Every day | RESPIRATORY_TRACT | 3 refills | Status: DC

## 2019-02-15 NOTE — Telephone Encounter (Signed)
Forms and Rx has been faxed to [facility]. Received successful fax confirmation. Pt is aware and voiced her understanding.  Nothing further is needed.

## 2019-03-01 ENCOUNTER — Telehealth: Payer: Self-pay | Admitting: Internal Medicine

## 2019-03-01 NOTE — Telephone Encounter (Signed)
Spoke to [facility] with prescription [employer] and made her aware that Rx for breo with fax cover sheet has been faxed to manufacture as requested.  Nothing further is needed.

## 2019-03-12 ENCOUNTER — Telehealth: Payer: Self-pay | Admitting: Internal Medicine

## 2019-03-12 MED ORDER — FLUTICASONE FUROATE-VILANTEROL 100-25 MCG/INH IN AEPB: 1.0000 | INHALATION_SPRAY | Freq: Every day | RESPIRATORY_TRACT | 3 refills | Status: DC

## 2019-03-12 NOTE — Telephone Encounter (Signed)
Called and spoke to pt, who is requesting update on Rx for Breo that was sent to [facility].  I have made pt aware that Rx and forms were faxed to [facility] on 01/29/2020 per phone note.  Pt has requested that I mailed forms and Rx. Rx for breo has been printed and placed in DK's folder for signature.  Once signed, Rx and and forms will be placed in outgoing mail.

## 2019-03-16 NOTE — Telephone Encounter (Signed)
Rx for Kimberly Vasquez has been signed. Rx and forms have been mailed to [facility] has requested by pt.  Nothing further is needed.

## 2019-11-03 ENCOUNTER — Ambulatory Visit: Payer: Self-pay

## 2019-11-18 NOTE — Progress Notes (Unsigned)
Patient pre-screened for BCCCP eligibility due to COVID 19 precautions. Two patient identifiers used for verification that I was speaking to correct patient.  Patient to Present directly to [facility] 11/23/19 for BCCCP screening mammogram. 

## 2019-11-22 ENCOUNTER — Other Ambulatory Visit: Payer: Self-pay | Admitting: Physician Assistant

## 2019-11-22 ENCOUNTER — Ambulatory Visit (HOSPITAL_COMMUNITY)
Admission: RE | Admit: 2019-11-22 | Discharge: 2019-11-22 | Disposition: A | Discharge status: Home / Self Care | Payer: HRSA Program | Source: Ambulatory Visit | Attending: Pulmonary Disease | Admitting: Pulmonary Disease

## 2019-11-22 ENCOUNTER — Telehealth: Payer: Self-pay | Admitting: Internal Medicine

## 2019-11-22 DIAGNOSIS — R7302 Impaired glucose tolerance (oral): Secondary | ICD-10-CM

## 2019-11-22 DIAGNOSIS — U071 COVID-19: Secondary | ICD-10-CM

## 2019-11-22 DIAGNOSIS — I10 Essential (primary) hypertension: Secondary | ICD-10-CM | POA: Diagnosis present

## 2019-11-22 DIAGNOSIS — J45909 Unspecified asthma, uncomplicated: Secondary | ICD-10-CM | POA: Diagnosis present

## 2019-11-22 MED ORDER — SODIUM CHLORIDE 0.9 % IV SOLN
1200.0000 mg | Freq: Once | INTRAVENOUS | Status: AC
  Administered 2019-11-22: 1200 mg via INTRAVENOUS

## 2019-11-22 MED ORDER — EPINEPHRINE 0.3 MG/0.3ML IJ SOAJ: 0.3000 mg | Freq: Once | INTRAMUSCULAR | Status: DC | PRN

## 2019-11-22 MED ORDER — ALBUTEROL SULFATE HFA 108 (90 BASE) MCG/ACT IN AERS: 2.0000 | INHALATION_SPRAY | Freq: Once | RESPIRATORY_TRACT | Status: DC | PRN

## 2019-11-22 MED ORDER — FAMOTIDINE IN NACL 20-0.9 MG/50ML-% IV SOLN: 20.0000 mg | Freq: Once | INTRAVENOUS | Status: DC | PRN

## 2019-11-22 MED ORDER — METHYLPREDNISOLONE SODIUM SUCC 125 MG IJ SOLR: 125.0000 mg | Freq: Once | INTRAMUSCULAR | Status: DC | PRN

## 2019-11-22 MED ORDER — SODIUM CHLORIDE 0.9 % IV SOLN: INTRAVENOUS | Status: DC | PRN

## 2019-11-22 MED ORDER — DIPHENHYDRAMINE HCL 50 MG/ML IJ SOLN: 50.0000 mg | Freq: Once | INTRAMUSCULAR | Status: DC | PRN

## 2019-11-22 NOTE — Discharge Instructions (Signed)

## 2019-11-22 NOTE — Telephone Encounter (Signed)
Called and spoke to patient. Patient stated that she had her second covid vaccine on 11/16/2019. She developed body aches, sinus drainage, increased sob with exertion, headache, loss of smell and diarrhea. She thought sx with related to covid vaccine. Her grandson tested positive, therefore all family got tested on saturday and patient was positive. She is not sure when sx started.  she has not measures oxygen levels, as she does not have a pulse ox. Patient will have family member pick up pulse ox. I have spoken to Dr. Belia Heman verbally, who recommended infusion, tussin OTC, tylenol, motrin and hydrate.   Patient aware of recommendations and advised to go to ED if oxygen levels are below 88% or if sx worsen.  I have left a message for infusion clinic.  Nothing further needed.

## 2019-11-22 NOTE — Progress Notes (Signed)
I connected by phone with Kimberly Vasquez on 11/22/2019 at 9:13 AM to discuss the potential use of a new treatment for mild to moderate COVID-19 viral infection in non-hospitalized patients.  This patient is a 59 y.o. female that meets the FDA criteria for Emergency Use Authorization of COVID monoclonal antibody casirivimab/imdevimab.  Has a (+) direct SARS-CoV-2 viral test result  Has mild or moderate COVID-19   Is NOT hospitalized due to COVID-19  Is within 10 days of symptom onset  Has at least one of the high risk factor(s) for progression to severe COVID-19 and/or hospitalization as defined in EUA.  Specific high risk criteria : BMI > 25, Diabetes, Cardiovascular disease or hypertension and Chronic Lung Disease   I have spoken and communicated the following to the patient or parent/caregiver regarding COVID monoclonal antibody treatment:  1. FDA has authorized the emergency use for the treatment of mild to moderate COVID-19 in adults and pediatric patients with positive results of direct SARS-CoV-2 viral testing who are 68 years of age and older weighing at least 40 kg, and who are at high risk for progressing to severe COVID-19 and/or hospitalization.  2. The significant known and potential risks and benefits of COVID monoclonal antibody, and the extent to which such potential risks and benefits are unknown.  3. Information on available alternative treatments and the risks and benefits of those alternatives, including clinical trials.  4. Patients treated with COVID monoclonal antibody should continue to self-isolate and use infection control measures (e.g., wear mask, isolate, social distance, avoid sharing personal items, clean and disinfect "high touch" surfaces, and frequent handwashing) according to CDC guidelines.   5. The patient or parent/caregiver has the option to accept or refuse COVID monoclonal antibody treatment.  After reviewing this information with the patient,  the patient has agreed to receive one of the available covid 19 monoclonal antibodies and will be provided an appropriate fact sheet prior to infusion.  Sx onset 9/28. Set up for infusion on 10/4 @ 3:30pm. Directions given to [facility]. Pt is aware that insurance will be charged an infusion fee. Pt is partially vaccinated (sx started after second shot).   Cline Crock 11/22/2019 9:13 AM

## 2019-11-22 NOTE — Progress Notes (Signed)
  Diagnosis: COVID-19  Physician:Patrick Wright, MD  Procedure: Covid Infusion Clinic Med: casirivimab\imdevimab infusion - Provided patient with casirivimab\imdevimab fact sheet for patients, parents and caregivers prior to infusion.  Complications: No immediate complications noted.  Discharge: Discharged home   Kimberly Vasquez G Kimberly Vasquez 11/22/2019   

## 2019-11-23 ENCOUNTER — Ambulatory Visit: Payer: Self-pay

## 2019-12-13 NOTE — Progress Notes (Signed)
Patient missed appointment on 11/23/19.  Left message for her to go to [facility] at 9:30 since BCCCP screening already completed. E-mail sent to [place].

## 2019-12-14 ENCOUNTER — Ambulatory Visit: Payer: Self-pay | Attending: Oncology | Admitting: *Deleted

## 2019-12-14 DIAGNOSIS — Z0000 Encounter for general adult medical examination without abnormal findings: Secondary | ICD-10-CM

## 2019-12-17 ENCOUNTER — Other Ambulatory Visit: Payer: Self-pay | Admitting: *Deleted

## 2019-12-17 ENCOUNTER — Ambulatory Visit
Admission: RE | Admit: 2019-12-17 | Discharge: 2019-12-17 | Disposition: A | Discharge status: Home / Self Care | Payer: Self-pay | Source: Ambulatory Visit | Attending: Oncology | Admitting: Oncology

## 2019-12-17 ENCOUNTER — Other Ambulatory Visit: Payer: Self-pay

## 2019-12-17 DIAGNOSIS — Z0000 Encounter for general adult medical examination without abnormal findings: Secondary | ICD-10-CM | POA: Insufficient documentation

## 2019-12-17 DIAGNOSIS — N6489 Other specified disorders of breast: Secondary | ICD-10-CM

## 2019-12-20 NOTE — Progress Notes (Signed)
A Televist was completed by Coralee Rud, RN to complete patient's health history and obtain verbal consent for BCCCP.  Patient to present directly to the [facility] for her screening mammogram.  Will follow up per BCCCP protocol.

## 2019-12-29 ENCOUNTER — Other Ambulatory Visit: Payer: Self-pay | Admitting: *Deleted

## 2019-12-29 ENCOUNTER — Ambulatory Visit
Admission: RE | Admit: 2019-12-29 | Discharge: 2019-12-29 | Disposition: A | Discharge status: Home / Self Care | Payer: Self-pay | Source: Ambulatory Visit | Attending: Oncology | Admitting: Oncology

## 2019-12-29 ENCOUNTER — Other Ambulatory Visit: Payer: Self-pay

## 2019-12-29 DIAGNOSIS — N6489 Other specified disorders of breast: Secondary | ICD-10-CM

## 2020-01-03 ENCOUNTER — Encounter: Payer: Self-pay | Admitting: *Deleted

## 2020-01-04 NOTE — Telephone Encounter (Signed)
VM TO PT TO [facility] 

## 2020-01-06 ENCOUNTER — Other Ambulatory Visit: Payer: Self-pay

## 2020-01-06 ENCOUNTER — Ambulatory Visit
Admission: RE | Admit: 2020-01-06 | Discharge: 2020-01-06 | Disposition: A | Discharge status: Home / Self Care | Payer: Self-pay | Source: Ambulatory Visit | Attending: Oncology | Admitting: Oncology

## 2020-01-06 DIAGNOSIS — N6489 Other specified disorders of breast: Secondary | ICD-10-CM

## 2020-01-06 HISTORY — PX: BREAST BIOPSY: SHX20

## 2020-01-07 ENCOUNTER — Encounter: Payer: Self-pay | Admitting: *Deleted

## 2020-01-10 LAB — SURGICAL PATHOLOGY

## 2020-01-11 ENCOUNTER — Encounter: Payer: Self-pay | Admitting: *Deleted

## 2020-01-11 NOTE — Progress Notes (Signed)
  Subjective:     Patient ID: Kimberly Vasquez, female   DOB: [birth year], 59 y.o.   MRN: [id number]  HPI   Review of Systems     Objective:   Physical Exam     Assessment:     Received message from Randa Lynn, RN from [facility] today that she had spoken with the patient and informed her of the radiologists recommendation for a surgical consult.  Patient wanted to see Dr. Evette Cristal, whom she has seen in the past, but he is now retired.    Plan:       I have scheduled her to see Dr. Maia Plan on Monday, January 17, 2020 @ 9:15.  She was made aware that BCCCP does not pay for surgery, but does pay for certain CPT codes.  If surgery is indicated, she was made aware she may need to apply for financial assistance through [facility].

## 2020-01-17 ENCOUNTER — Ambulatory Visit: Payer: Self-pay | Admitting: General Surgery

## 2020-01-17 ENCOUNTER — Encounter: Payer: Self-pay | Admitting: *Deleted

## 2020-01-17 NOTE — H&P (Signed)
PATIENT PROFILE: Kimberly Vasquez is a 59 y.o. female who presents to the Clinic for evaluation of abnormal microcalcification of the right breast.  PCP:  None  HISTORY OF PRESENT ILLNESS: Kimberly Vasquez reports she had her usual screening mammogram.  Distortion was found with microcalcifications on the right breast.  Diagnostic mammogram of the right breast was done with persistent distortion and microcalcifications of the right breast.  A stereotactic core needle biopsy was done.  This shows atypical glandular tissue that was not able to rule out malignancy with core needle biopsy.  Excisional biopsy was recommended.  Family history of breast cancer: Grandmother from father side Family history of other cancers: Father with prostate Menarche: 19 years old Menopause: 59 years old after hysterectomy due to bleeding Used OCP: None Used estrogen and progesterone therapy: None History of Radiation to the chest: None Number of pregnancies 3 Age of first pregnancy 27 Previous breast biopsy: 2 (1 on each breast on the 1990s; benign)  PROBLEM LIST: Problem List  Never Reviewed         Noted   Obesity Unknown   Asthma without status asthmaticus, unspecified Unknown   Allergic rhinitis Unknown   IGT (impaired glucose tolerance) Unknown      GENERAL REVIEW OF SYSTEMS:   General ROS: negative for - chills, fatigue, fever, weight gain or weight loss Allergy and Immunology ROS: negative for - hives  Hematological and Lymphatic ROS: negative for - bleeding problems or bruising, negative for palpable nodes Endocrine ROS: negative for - heat or cold intolerance, hair changes Respiratory ROS: negative for - cough, shortness of breath or wheezing Cardiovascular ROS: no chest pain or palpitations GI ROS: negative for nausea, vomiting, abdominal pain, diarrhea, constipation Musculoskeletal ROS: negative for - joint swelling or muscle pain Neurological ROS: negative for - confusion, syncope Dermatological  ROS: negative for pruritus and rash Psychiatric: negative for anxiety, depression, difficulty sleeping and memory loss  MEDICATIONS: Current Outpatient Medications  Medication Sig Dispense Refill  . albuterol (PROVENTIL) 2.5 mg /3 mL (0.083 %) nebulizer solution Inhale 2.5 mg into the lungs every 6 (six) hours as needed    . albuterol 90 mcg/actuation inhaler Inhale 2 inhalations into the lungs every 6 (six) hours as needed for Wheezing. 3 Inhaler 1  . cetirizine (ZYRTEC) 10 MG tablet Take 10 mg by mouth once daily.    . fluticasone furoate-vilanteroL 200-25 mcg/dose DsDv Inhale 1 inhalation into the lungs daily    . omeprazole (PRILOSEC) 20 MG DR capsule Take 40 mg by mouth once daily    . triamcinolone (NASACORT AQ) 55 mcg nasal spray Place into one nostril once daily     No current facility-administered medications for this visit.    ALLERGIES: Codeine  PAST MEDICAL HISTORY: Past Medical History:  Diagnosis Date  . Allergic rhinitis   . Allergic state   . Asthma without status asthmaticus, unspecified   . GERD (gastroesophageal reflux disease)   . Gestational diabetes mellitus    x2 out of 3 pregnancies  . Hyperlipidemia   . IGT (impaired glucose tolerance) 09/14  . Obesity   . Osteopenia   . Vitamin D deficiency     PAST SURGICAL HISTORY: Past Surgical History:  Procedure Laterality Date  . BREAST EXCISIONAL BIOPSY  1993, 1997   Negative  . CHOLECYSTECTOMY  2005  . HYSTERECTOMY  2001   Still has ovaries  . MASTECTOMY PARTIAL / LUMPECTOMY Left 1979   Dr Okey Dupre  . MASTECTOMY PARTIAL /  LUMPECTOMY Right 1991   Dr Evette Cristal     FAMILY HISTORY: Family History  Problem Relation Age of Onset  . Diabetes type II Mother   . Coronary Artery Disease (Blocked arteries around heart) Mother   . Diabetes type II Brother   . Coronary Artery Disease (Blocked arteries around heart) Brother   . Peripheral Vascular Disease (PVD or blocked arteries in arms and legs) Father       SOCIAL HISTORY: Social History   Socioeconomic History  . Marital status: Married    Spouse name: Not on file  . Number of children: 3  . Years of education: Not on file  . Highest education level: Not on file  Occupational History  . Occupation: Unemployed  Tobacco Use  . Smoking status: Former Smoker    Packs/day: 1.00    Years: 15.00    Pack years: 15.00    Types: Cigarettes    Quit date: 06/30/1998    Years since quitting: 21.5  . Smokeless tobacco: Never Used  Vaping Use  . Vaping Use: Never used  Substance and Sexual Activity  . Alcohol use: No  . Drug use: No  . Sexual activity: Not on file  Other Topics Concern  . Not on file  Social History Narrative   Lives w/ husband, daughter & 3 grandchildren   Social Determinants of Health   Financial Resource Strain: Not on file  Food Insecurity: Not on file  Transportation Needs: Not on file    PHYSICAL EXAM: Vitals:   01/17/20 0900  BP: 151/80  Pulse: 78   Body mass index is 36.11 kg/m. Weight: (!) 106.1 kg (234 lb)   GENERAL: Alert, active, oriented x3  HEENT: Pupils equal reactive to light. Extraocular movements are intact. Sclera clear. Palpebral conjunctiva normal red color.Pharynx clear.  NECK: Supple with no palpable mass and no adenopathy.  LUNGS: Sound clear with no rales rhonchi or wheezes.  HEART: Regular rhythm S1 and S2 without murmur.  BREAST: breasts appear normal, no suspicious masses, no skin or nipple changes or axillary nodes.  ABDOMEN: Soft and depressible, nontender with no palpable mass, no hepatomegaly.  EXTREMITIES: Well-developed well-nourished symmetrical with no dependent edema.  NEUROLOGICAL: Awake alert oriented, facial expression symmetrical, moving all extremities.  REVIEW OF DATA: I have reviewed the following data today: No visits with results within 3 Month(s) from this visit.  Latest known visit with results is:  No results found for any previous visit.      ASSESSMENT: Kimberly Vasquez is a 59 y.o. female presenting for consultation for abnormal microcalcification distortion of the right breast.    Patient was oriented again about the pathology results.  Since the core needle biopsy was unable to rule out malignancy, excisional biopsy was recommended. Surgical technique and post operative care was discussed with patient. Risk of surgery was discussed with patient including but not limited to: wound infection, seroma, hematoma, brachial plexopathy, mondor's disease (thrombosis of small veins of breast), chronic wound pain, breast lymphedema, altered sensation to the nipple and cosmesis among others.   Abnormal mammogram with distortion [R92.0]  PLAN: 1. Right breast radiofrequency tagged excisional biopsy 2. CBC, CMP 3. Avoid taking aspirin 5 days before surgery 4. Contact us if you have any concern.   Patient verbalized understanding, all questions were answered, and were agreeable with the plan outlined above.     Carolan Shiver, MD  Electronically signed by Carolan Shiver, MD

## 2020-01-17 NOTE — H&P (View-Only) (Signed)
PATIENT PROFILE: Kimberly Vasquez is a 59 y.o. female who presents to the Clinic for evaluation of abnormal microcalcification of the right breast.  PCP:  None  HISTORY OF PRESENT ILLNESS: Kimberly Vasquez reports she had her usual screening mammogram.  Distortion was found with microcalcifications on the right breast.  Diagnostic mammogram of the right breast was done with persistent distortion and microcalcifications of the right breast.  A stereotactic core needle biopsy was done.  This shows atypical glandular tissue that was not able to rule out malignancy with core needle biopsy.  Excisional biopsy was recommended.  Family history of breast cancer: Grandmother from father side Family history of other cancers: Father with prostate Menarche: 19 years old Menopause: 59 years old after hysterectomy due to bleeding Used OCP: None Used estrogen and progesterone therapy: None History of Radiation to the chest: None Number of pregnancies 3 Age of first pregnancy 27 Previous breast biopsy: 2 (1 on each breast on the 1990s; benign)  PROBLEM LIST: Problem List  Never Reviewed         Noted   Obesity Unknown   Asthma without status asthmaticus, unspecified Unknown   Allergic rhinitis Unknown   IGT (impaired glucose tolerance) Unknown      GENERAL REVIEW OF SYSTEMS:   General ROS: negative for - chills, fatigue, fever, weight gain or weight loss Allergy and Immunology ROS: negative for - hives  Hematological and Lymphatic ROS: negative for - bleeding problems or bruising, negative for palpable nodes Endocrine ROS: negative for - heat or cold intolerance, hair changes Respiratory ROS: negative for - cough, shortness of breath or wheezing Cardiovascular ROS: no chest pain or palpitations GI ROS: negative for nausea, vomiting, abdominal pain, diarrhea, constipation Musculoskeletal ROS: negative for - joint swelling or muscle pain Neurological ROS: negative for - confusion, syncope Dermatological  ROS: negative for pruritus and rash Psychiatric: negative for anxiety, depression, difficulty sleeping and memory loss  MEDICATIONS: Current Outpatient Medications  Medication Sig Dispense Refill  . albuterol (PROVENTIL) 2.5 mg /3 mL (0.083 %) nebulizer solution Inhale 2.5 mg into the lungs every 6 (six) hours as needed    . albuterol 90 mcg/actuation inhaler Inhale 2 inhalations into the lungs every 6 (six) hours as needed for Wheezing. 3 Inhaler 1  . cetirizine (ZYRTEC) 10 MG tablet Take 10 mg by mouth once daily.    . fluticasone furoate-vilanteroL 200-25 mcg/dose DsDv Inhale 1 inhalation into the lungs daily    . omeprazole (PRILOSEC) 20 MG DR capsule Take 40 mg by mouth once daily    . triamcinolone (NASACORT AQ) 55 mcg nasal spray Place into one nostril once daily     No current facility-administered medications for this visit.    ALLERGIES: Codeine  PAST MEDICAL HISTORY: Past Medical History:  Diagnosis Date  . Allergic rhinitis   . Allergic state   . Asthma without status asthmaticus, unspecified   . GERD (gastroesophageal reflux disease)   . Gestational diabetes mellitus    x2 out of 3 pregnancies  . Hyperlipidemia   . IGT (impaired glucose tolerance) 09/14  . Obesity   . Osteopenia   . Vitamin D deficiency     PAST SURGICAL HISTORY: Past Surgical History:  Procedure Laterality Date  . BREAST EXCISIONAL BIOPSY  1993, 1997   Negative  . CHOLECYSTECTOMY  2005  . HYSTERECTOMY  2001   Still has ovaries  . MASTECTOMY PARTIAL / LUMPECTOMY Left 1979   Dr Okey Dupre  . MASTECTOMY PARTIAL /  LUMPECTOMY Right 1991   Dr Sankar     FAMILY HISTORY: Family History  Problem Relation Age of Onset  . Diabetes type II Mother   . Coronary Artery Disease (Blocked arteries around heart) Mother   . Diabetes type II Brother   . Coronary Artery Disease (Blocked arteries around heart) Brother   . Peripheral Vascular Disease (PVD or blocked arteries in arms and legs) Father       SOCIAL HISTORY: Social History   Socioeconomic History  . Marital status: Married    Spouse name: Not on file  . Number of children: 3  . Years of education: Not on file  . Highest education level: Not on file  Occupational History  . Occupation: Unemployed  Tobacco Use  . Smoking status: Former Smoker    Packs/day: 1.00    Years: 15.00    Pack years: 15.00    Types: Cigarettes    Quit date: 06/30/1998    Years since quitting: 21.5  . Smokeless tobacco: Never Used  Vaping Use  . Vaping Use: Never used  Substance and Sexual Activity  . Alcohol use: No  . Drug use: No  . Sexual activity: Not on file  Other Topics Concern  . Not on file  Social History Narrative   Lives w/ husband, daughter & 3 grandchildren   Social Determinants of Health   Financial Resource Strain: Not on file  Food Insecurity: Not on file  Transportation Needs: Not on file    PHYSICAL EXAM: Vitals:   01/17/20 0900  BP: 151/80  Pulse: 78   Body mass index is 36.11 kg/m. Weight: (!) 106.1 kg (234 lb)   GENERAL: Alert, active, oriented x3  HEENT: Pupils equal reactive to light. Extraocular movements are intact. Sclera clear. Palpebral conjunctiva normal red color.Pharynx clear.  NECK: Supple with no palpable mass and no adenopathy.  LUNGS: Sound clear with no rales rhonchi or wheezes.  HEART: Regular rhythm S1 and S2 without murmur.  BREAST: breasts appear normal, no suspicious masses, no skin or nipple changes or axillary nodes.  ABDOMEN: Soft and depressible, nontender with no palpable mass, no hepatomegaly.  EXTREMITIES: Well-developed well-nourished symmetrical with no dependent edema.  NEUROLOGICAL: Awake alert oriented, facial expression symmetrical, moving all extremities.  REVIEW OF DATA: I have reviewed the following data today: No visits with results within 3 Month(s) from this visit.  Latest known visit with results is:  No results found for any previous visit.      ASSESSMENT: Kimberly Vasquez is a 59 y.o. female presenting for consultation for abnormal microcalcification distortion of the right breast.    Patient was oriented again about the pathology results.  Since the core needle biopsy was unable to rule out malignancy, excisional biopsy was recommended. Surgical technique and post operative care was discussed with patient. Risk of surgery was discussed with patient including but not limited to: wound infection, seroma, hematoma, brachial plexopathy, mondor's disease (thrombosis of small veins of breast), chronic wound pain, breast lymphedema, altered sensation to the nipple and cosmesis among others.   Abnormal mammogram with distortion [R92.0]  PLAN: 1. Right breast radiofrequency tagged excisional biopsy 2. CBC, CMP 3. Avoid taking aspirin 5 days before surgery 4. Contact us if you have any concern.   Patient verbalized understanding, all questions were answered, and were agreeable with the plan outlined above.     Marquavius Scaife Cintron-Diaz, MD  Electronically signed by Kwanza Cancelliere Cintron-Diaz, MD 

## 2020-01-18 ENCOUNTER — Other Ambulatory Visit: Payer: Self-pay | Admitting: General Surgery

## 2020-01-18 DIAGNOSIS — R920 Mammographic microcalcification found on diagnostic imaging of breast: Secondary | ICD-10-CM

## 2020-01-26 ENCOUNTER — Other Ambulatory Visit: Payer: Self-pay

## 2020-01-26 ENCOUNTER — Ambulatory Visit
Admission: RE | Admit: 2020-01-26 | Discharge: 2020-01-26 | Disposition: A | Discharge status: Home / Self Care | Payer: Self-pay | Source: Ambulatory Visit | Attending: General Surgery | Admitting: General Surgery

## 2020-01-26 DIAGNOSIS — R920 Mammographic microcalcification found on diagnostic imaging of breast: Secondary | ICD-10-CM | POA: Insufficient documentation

## 2020-01-27 ENCOUNTER — Encounter
Admission: RE | Admit: 2020-01-27 | Discharge: 2020-01-27 | Disposition: A | Discharge status: Home / Self Care | Payer: Self-pay | Source: Ambulatory Visit | Attending: General Surgery | Admitting: General Surgery

## 2020-01-27 HISTORY — DX: Sleep apnea, unspecified: G47.30

## 2020-01-27 NOTE — Patient Instructions (Addendum)
Your procedure is scheduled on: Wednesday, December 15 Report to the Registration Desk on the 1st floor of the [facility] To find out your arrival time, please call [phone] between 1PM - 3PM on: Tuesday, December 14  REMEMBER: Instructions that are not followed completely may result in serious medical risk, up to and including death; or upon the discretion of your surgeon and anesthesiologist your surgery may need to be rescheduled.  Do not eat food after midnight the night before surgery.  No gum chewing, lozengers or hard candies.  You may however, drink CLEAR liquids up to 2 hours before you are scheduled to arrive for your surgery. Do not drink anything within 2 hours of your scheduled arrival time.  Clear liquids include: - water  - apple juice without pulp - gatorade (not RED, PURPLE, OR BLUE) - black coffee or tea (Do NOT add milk or creamers to the coffee or tea) Do NOT drink anything that is not on this list.  TAKE THESE MEDICATIONS THE MORNING OF SURGERY WITH A SIP OF WATER:  1.  Albuterol nebulizer 2.  breo ellipta inhaler 3.  Omeprazole (Prilosec) - (take one the night before and one on the morning of surgery - helps to prevent nausea after surgery.)  Use inhalers on the day of surgery and bring to the hospital.  One week prior to surgery: Stop Anti-inflammatories (NSAIDS) such as Advil, Aleve, Ibuprofen, Motrin, Naproxen, Naprosyn and Aspirin based products such as Excedrin, Goodys Powder, BC Powder. Stop ANY OVER THE COUNTER supplements until after surgery.  No Alcohol for 24 hours before or after surgery.  No Smoking including e-cigarettes for 24 hours prior to surgery.  No chewable tobacco products for at least 6 hours prior to surgery.  No nicotine patches on the day of surgery.  Do not use any "recreational" drugs for at least a week prior to your surgery.  Please be advised that the combination of cocaine and anesthesia may have negative outcomes, up  to and including death. If you test positive for cocaine, your surgery will be cancelled.  On the morning of surgery brush your teeth with toothpaste and water, you may rinse your mouth with mouthwash if you wish. Do not swallow any toothpaste or mouthwash.  Do not wear jewelry, make-up, hairpins, clips or nail polish.  Do not wear lotions, powders, or perfumes.   Do not shave body from the neck down 48 hours prior to surgery just in case you cut yourself which could leave a site for infection.  Also, freshly shaved skin may become irritated if using the CHG soap.  Do not bring valuables to the hospital. [facility] is not responsible for any missing/lost belongings or valuables.   Use CHG Soap as directed on instruction sheet.  Bring your C-PAP to the hospital with you in case you may have to spend the night.   Notify your doctor if there is any change in your medical condition (cold, fever, infection).  Wear comfortable clothing (specific to your surgery type) to the hospital.  Plan for stool softeners for home use; pain medications have a tendency to cause constipation. You can also help prevent constipation by eating foods high in fiber such as fruits and vegetables and drinking plenty of fluids as your diet allows.  After surgery, you can help prevent lung complications by doing breathing exercises.  Take deep breaths and cough every 1-2 hours. Your doctor may order a device called an Incentive Spirometer to help  you take deep breaths.  If you are being discharged the day of surgery, you will not be allowed to drive home. You will need a responsible adult (18 years or older) to drive you home and stay with you that night.   If you are taking public transportation, you will need to have a responsible adult (18 years or older) with you. Please confirm with your physician that it is acceptable to use public transportation.   Please call the Pre-admissions Testing Dept. at [phone] if you have any questions about these instructions.  Visitation Policy:  Patients undergoing a surgery or procedure may have one family member or support person with them as long as that person is not COVID-19 positive or experiencing its symptoms.  That person may remain in the waiting area during the procedure.

## 2020-01-31 ENCOUNTER — Other Ambulatory Visit
Admission: RE | Admit: 2020-01-31 | Discharge: 2020-01-31 | Disposition: A | Discharge status: Home / Self Care | Payer: HRSA Program | Source: Ambulatory Visit | Attending: General Surgery | Admitting: General Surgery

## 2020-01-31 ENCOUNTER — Other Ambulatory Visit: Payer: Self-pay

## 2020-01-31 DIAGNOSIS — Z20822 Contact with and (suspected) exposure to covid-19: Secondary | ICD-10-CM | POA: Insufficient documentation

## 2020-01-31 DIAGNOSIS — Z01812 Encounter for preprocedural laboratory examination: Secondary | ICD-10-CM | POA: Insufficient documentation

## 2020-02-01 LAB — SARS CORONAVIRUS 2 (TAT 6-24 HRS): SARS Coronavirus 2: NEGATIVE

## 2020-02-02 ENCOUNTER — Encounter
Admission: RE | Disposition: A | Discharge status: Home / Self Care | Payer: Self-pay | Source: Home / Self Care | Attending: General Surgery

## 2020-02-02 ENCOUNTER — Ambulatory Visit: Payer: Self-pay | Admitting: Certified Registered Nurse Anesthetist

## 2020-02-02 ENCOUNTER — Ambulatory Visit
Admission: RE | Admit: 2020-02-02 | Discharge: 2020-02-02 | Disposition: A | Discharge status: Home / Self Care | Payer: Self-pay | Source: Ambulatory Visit | Attending: General Surgery | Admitting: General Surgery

## 2020-02-02 ENCOUNTER — Encounter: Payer: Self-pay | Admitting: General Surgery

## 2020-02-02 ENCOUNTER — Other Ambulatory Visit: Payer: Self-pay

## 2020-02-02 ENCOUNTER — Ambulatory Visit
Admission: RE | Admit: 2020-02-02 | Discharge: 2020-02-02 | Disposition: A | Discharge status: Home / Self Care | Payer: Self-pay | Attending: General Surgery | Admitting: General Surgery

## 2020-02-02 DIAGNOSIS — R920 Mammographic microcalcification found on diagnostic imaging of breast: Secondary | ICD-10-CM

## 2020-02-02 HISTORY — PX: BREAST BIOPSY WITH RADIO FREQUENCY LOCALIZER: SHX6895

## 2020-02-02 SURGERY — BREAST BIOPSY WITH RADIO FREQUENCY LOCALIZER
Anesthesia: General | Site: Breast | Laterality: Right

## 2020-02-02 MED ORDER — ONDANSETRON HCL 4 MG/2ML IJ SOLN: 4.0000 mg | Freq: Once | INTRAMUSCULAR | Status: DC | PRN

## 2020-02-02 MED ORDER — FUROSEMIDE 10 MG/ML IJ SOLN
INTRAMUSCULAR | Status: AC
  Filled 2020-02-02: qty 2

## 2020-02-02 MED ORDER — KETOROLAC TROMETHAMINE 30 MG/ML IJ SOLN
INTRAMUSCULAR | Status: DC | PRN
  Administered 2020-02-02: 15 mg via INTRAVENOUS

## 2020-02-02 MED ORDER — CEFAZOLIN SODIUM-DEXTROSE 2-4 GM/100ML-% IV SOLN
INTRAVENOUS | Status: AC
  Filled 2020-02-02: qty 100

## 2020-02-02 MED ORDER — CHLORHEXIDINE GLUCONATE 0.12 % MT SOLN
OROMUCOSAL | Status: AC
  Administered 2020-02-02: 10:00:00 15 mL via OROMUCOSAL
  Filled 2020-02-02: qty 15

## 2020-02-02 MED ORDER — ONDANSETRON HCL 4 MG/2ML IJ SOLN
INTRAMUSCULAR | Status: DC | PRN
  Administered 2020-02-02: 4 mg via INTRAVENOUS

## 2020-02-02 MED ORDER — BUPIVACAINE-EPINEPHRINE (PF) 0.5% -1:200000 IJ SOLN
INTRAMUSCULAR | Status: DC | PRN
  Administered 2020-02-02: 30 mL

## 2020-02-02 MED ORDER — SUCCINYLCHOLINE CHLORIDE 20 MG/ML IJ SOLN
INTRAMUSCULAR | Status: DC | PRN
  Administered 2020-02-02: 140 mg via INTRAVENOUS

## 2020-02-02 MED ORDER — BUPIVACAINE-EPINEPHRINE (PF) 0.5% -1:200000 IJ SOLN
INTRAMUSCULAR | Status: AC
  Filled 2020-02-02: qty 30

## 2020-02-02 MED ORDER — SUGAMMADEX SODIUM 500 MG/5ML IV SOLN
INTRAVENOUS | Status: DC | PRN
  Administered 2020-02-02: 400 mg via INTRAVENOUS

## 2020-02-02 MED ORDER — FUROSEMIDE 10 MG/ML IJ SOLN
10.0000 mg | Freq: Once | INTRAMUSCULAR | Status: AC
  Administered 2020-02-02: 14:00:00 10 mg via INTRAVENOUS

## 2020-02-02 MED ORDER — CHLORHEXIDINE GLUCONATE 0.12 % MT SOLN: 15.0000 mL | Freq: Once | OROMUCOSAL | Status: AC

## 2020-02-02 MED ORDER — MIDAZOLAM HCL 2 MG/2ML IJ SOLN
INTRAMUSCULAR | Status: AC
  Filled 2020-02-02: qty 2

## 2020-02-02 MED ORDER — CEFAZOLIN SODIUM-DEXTROSE 2-4 GM/100ML-% IV SOLN
2.0000 g | INTRAVENOUS | Status: AC
  Administered 2020-02-02: 12:00:00 2 g via INTRAVENOUS
  Filled 2020-02-02 (×2): qty 100

## 2020-02-02 MED ORDER — IPRATROPIUM-ALBUTEROL 0.5-2.5 (3) MG/3ML IN SOLN
3.0000 mL | Freq: Once | RESPIRATORY_TRACT | Status: AC
  Administered 2020-02-02: 14:00:00 3 mL via RESPIRATORY_TRACT

## 2020-02-02 MED ORDER — TRAMADOL HCL 50 MG PO TABS: 50.0000 mg | ORAL_TABLET | Freq: Four times a day (QID) | ORAL | 0 refills | Status: DC | PRN

## 2020-02-02 MED ORDER — FENTANYL CITRATE (PF) 100 MCG/2ML IJ SOLN: 25.0000 ug | INTRAMUSCULAR | Status: DC | PRN

## 2020-02-02 MED ORDER — LACTATED RINGERS IV SOLN: INTRAVENOUS | Status: DC

## 2020-02-02 MED ORDER — IPRATROPIUM-ALBUTEROL 0.5-2.5 (3) MG/3ML IN SOLN
RESPIRATORY_TRACT | Status: AC
  Filled 2020-02-02: qty 3

## 2020-02-02 MED ORDER — TRAMADOL HCL 50 MG PO TABS
ORAL_TABLET | ORAL | Status: AC
  Administered 2020-02-02: 14:00:00 50 mg via ORAL
  Filled 2020-02-02: qty 1

## 2020-02-02 MED ORDER — FENTANYL CITRATE (PF) 100 MCG/2ML IJ SOLN
INTRAMUSCULAR | Status: DC | PRN
  Administered 2020-02-02 (×2): 50 ug via INTRAVENOUS

## 2020-02-02 MED ORDER — ACETAMINOPHEN 10 MG/ML IV SOLN
INTRAVENOUS | Status: DC | PRN
  Administered 2020-02-02: 1000 mg via INTRAVENOUS

## 2020-02-02 MED ORDER — DEXAMETHASONE SODIUM PHOSPHATE 10 MG/ML IJ SOLN
INTRAMUSCULAR | Status: DC | PRN
  Administered 2020-02-02: 8 mg via INTRAVENOUS

## 2020-02-02 MED ORDER — PROPOFOL 10 MG/ML IV BOLUS
INTRAVENOUS | Status: AC
  Filled 2020-02-02: qty 20

## 2020-02-02 MED ORDER — ORAL CARE MOUTH RINSE: 15.0000 mL | Freq: Once | OROMUCOSAL | Status: AC

## 2020-02-02 MED ORDER — PROPOFOL 10 MG/ML IV BOLUS
INTRAVENOUS | Status: AC
  Filled 2020-02-02: qty 40

## 2020-02-02 MED ORDER — LIDOCAINE HCL (CARDIAC) PF 100 MG/5ML IV SOSY
PREFILLED_SYRINGE | INTRAVENOUS | Status: DC | PRN
  Administered 2020-02-02: 100 mg via INTRAVENOUS

## 2020-02-02 MED ORDER — ROCURONIUM BROMIDE 100 MG/10ML IV SOLN
INTRAVENOUS | Status: DC | PRN
  Administered 2020-02-02: 60 mg via INTRAVENOUS

## 2020-02-02 MED ORDER — MIDAZOLAM HCL 2 MG/2ML IJ SOLN
INTRAMUSCULAR | Status: DC | PRN
  Administered 2020-02-02: 1 mg via INTRAVENOUS

## 2020-02-02 MED ORDER — PROPOFOL 10 MG/ML IV BOLUS
INTRAVENOUS | Status: DC | PRN
  Administered 2020-02-02: 110 mg via INTRAVENOUS
  Administered 2020-02-02: 50 mg via INTRAVENOUS
  Administered 2020-02-02: 40 mg via INTRAVENOUS
  Administered 2020-02-02 (×2): 20 mg via INTRAVENOUS
  Administered 2020-02-02 (×2): 40 mg via INTRAVENOUS

## 2020-02-02 MED ORDER — FENTANYL CITRATE (PF) 100 MCG/2ML IJ SOLN
INTRAMUSCULAR | Status: AC
  Filled 2020-02-02: qty 2

## 2020-02-02 MED ORDER — TRAMADOL HCL 50 MG PO TABS
50.0000 mg | ORAL_TABLET | Freq: Once | ORAL | Status: AC
Start: 2020-02-02 — End: 2020-02-02

## 2020-02-02 SURGICAL SUPPLY — 48 items
ADH SKN CLS APL DERMABOND .7 (GAUZE/BANDAGES/DRESSINGS) ×1
APL PRP STRL LF DISP 70% ISPRP (MISCELLANEOUS) ×1
BLADE SURG 15 STRL LF DISP TIS (BLADE) ×1 IMPLANT
BLADE SURG 15 STRL SS (BLADE) ×3
CANISTER SUCT 1200ML W/VALVE (MISCELLANEOUS) ×3 IMPLANT
CHLORAPREP W/TINT 26 (MISCELLANEOUS) ×3 IMPLANT
CNTNR SPEC 2.5X3XGRAD LEK (MISCELLANEOUS) ×1
CONT SPEC 4OZ STER OR WHT (MISCELLANEOUS) ×2
CONT SPEC 4OZ STRL OR WHT (MISCELLANEOUS) ×1
CONTAINER SPEC 2.5X3XGRAD LEK (MISCELLANEOUS) ×1 IMPLANT
COVER WAND RF STERILE (DRAPES) ×3 IMPLANT
DERMABOND ADVANCED (GAUZE/BANDAGES/DRESSINGS) ×2
DERMABOND ADVANCED .7 DNX12 (GAUZE/BANDAGES/DRESSINGS) ×1 IMPLANT
DEVICE DUBIN SPECIMEN MAMMOGRA (MISCELLANEOUS) ×3 IMPLANT
DRAPE LAPAROTOMY TRNSV 106X77 (MISCELLANEOUS) ×3 IMPLANT
ELECT CAUTERY BLADE TIP 2.5 (TIP) ×3
ELECT REM PT RETURN 9FT ADLT (ELECTROSURGICAL) ×3
ELECTRODE CAUTERY BLDE TIP 2.5 (TIP) ×1 IMPLANT
ELECTRODE REM PT RTRN 9FT ADLT (ELECTROSURGICAL) ×1 IMPLANT
GLOVE BIO SURGEON STRL SZ 6.5 (GLOVE) ×2 IMPLANT
GLOVE BIO SURGEONS STRL SZ 6.5 (GLOVE) ×1
GLOVE BIOGEL PI IND STRL 6.5 (GLOVE) ×1 IMPLANT
GLOVE BIOGEL PI INDICATOR 6.5 (GLOVE) ×2
GOWN STRL REUS W/ TWL LRG LVL3 (GOWN DISPOSABLE) ×3 IMPLANT
GOWN STRL REUS W/TWL LRG LVL3 (GOWN DISPOSABLE) ×9
KIT MARKER MARGIN INK (KITS) ×2 IMPLANT
KIT TURNOVER KIT A (KITS) ×3 IMPLANT
LABEL OR SOLS (LABEL) ×3 IMPLANT
MANIFOLD NEPTUNE II (INSTRUMENTS) ×3 IMPLANT
MARGIN MAP 10MM (MISCELLANEOUS) IMPLANT
MARKER MARGIN CORRECT CLIP (MARKER) ×2 IMPLANT
NDL HYPO 25X1 1.5 SAFETY (NEEDLE) ×1 IMPLANT
NEEDLE HYPO 25X1 1.5 SAFETY (NEEDLE) ×3 IMPLANT
PACK BASIN MINOR ARMC (MISCELLANEOUS) ×3 IMPLANT
RETRACTOR RING XSMALL (MISCELLANEOUS) IMPLANT
RTRCTR WOUND ALEXIS 13CM XS SH (MISCELLANEOUS) ×3
SET LOCALIZER 20 PROBE US (MISCELLANEOUS) ×3 IMPLANT
SUT ETHILON 3-0 FS-10 30 BLK (SUTURE)
SUT MNCRL 4-0 (SUTURE) ×3
SUT MNCRL 4-0 27XMFL (SUTURE) ×1
SUT SILK 2 0 SH (SUTURE) ×3 IMPLANT
SUT VIC AB 3-0 SH 27 (SUTURE) ×3
SUT VIC AB 3-0 SH 27X BRD (SUTURE) ×1 IMPLANT
SUTURE EHLN 3-0 FS-10 30 BLK (SUTURE) IMPLANT
SUTURE MNCRL 4-0 27XMF (SUTURE) ×1 IMPLANT
SYR 10ML LL (SYRINGE) ×3 IMPLANT
SYR BULB IRRIG 60ML STRL (SYRINGE) ×3 IMPLANT
WATER STERILE IRR 1000ML POUR (IV SOLUTION) ×3 IMPLANT

## 2020-02-02 NOTE — Op Note (Signed)
Preoperative diagnosis: Right breast mass with atypia  Postoperative diagnosis: Right breast mass with atypia.   Procedure: Right radiofrequency tag-localized excisional biopsy                       Anesthesia: GETA  Surgeon: Dr. Hazle Quant  Wound Classification: Clean  Indications: Patient is a 59 y.o. female with a nonpalpable right breast mass noted on mammography with core biopsy demonstrating atypia requires radiofrequency tag-localized excisional biopsy to rule out malignancy.    Findings: 1. Specimen mammography shows tag on specimen 2. Pathology call refers gross examination of margins was biopsy changes 3. The biopsy clip was unable to be identified. Pre op imagine showed inferior migration.  3. No other palpable mass or lymph node identified.   Description of procedure: Preoperative radiofrequency tag localization was performed by radiology.  Localization studies were reviewed. The patient was taken to the operating room and placed supine on the operating table, and after general anesthesia the right chest and axilla were prepped and draped in the usual sterile fashion. A time-out was completed verifying correct patient, procedure, site, positioning, and implant(s) and/or special equipment prior to beginning this procedure.  By comparing the localization studies and interrogation with Localizer device, the probable trajectory and location of the mass was visualized. A circumareolar skin incision was planned in such a way as to minimize the amount of dissection to reach the mass.  The skin incision was made. Flaps were raised and the location of the tag was confirmed with Localizer device confirmed. A 2-0 silk figure-of-eight stay suture was placed and used for retraction. Dissection was then taken down circumferentially, taking care to include the entire localizing tag and a wide margin of grossly normal tissue. The specimen and entire localizing tag were removed. The specimen was  oriented and sent to radiology with the localization studies. The biopsy clip was not identified. Since pre op imaging showed inferior migration, the inferior margin was excised. No clip was identified. Pathology reported possible biopsy changes on gross evaluation. No further re excision was done.  The wound was irrigated. Hemostasis was checked. The wound was closed with interrupted sutures of 3-0 Vicryl and a subcuticular suture of Monocryl 3-0. No attempt was made to close the dead space.  The patient tolerated the procedure well and was taken to the postanesthesia care unit in stable condition.   Specimen: Right Breast excisional biopsy                     Inferior margin  Complications: None  Estimated Blood Loss: 5 mL

## 2020-02-02 NOTE — Anesthesia Postprocedure Evaluation (Signed)
Anesthesia Post Note  Patient: Kimberly Vasquez  Procedure(s) Performed: BREAST BIOPSY WITH RADIO FREQUENCY LOCALIZER (Right Breast)  Patient location during evaluation: PACU Anesthesia Type: General Level of consciousness: awake and alert and oriented Pain management: pain level controlled Vital Signs Assessment: post-procedure vital signs reviewed and stable Respiratory status: spontaneous breathing Cardiovascular status: blood pressure returned to baseline Anesthetic complications: no   No complications documented.   Last Vitals:  Vitals:   02/02/20 1424 02/02/20 1433  BP:  (!) 147/75  Pulse:  89  Resp:  18  Temp:  36.4 C  SpO2: 95% 97%    Last Pain:  Vitals:   02/02/20 1433  TempSrc: Temporal  PainSc: 3                  Delbra Zellars

## 2020-02-02 NOTE — Progress Notes (Addendum)
sats would intermittently drop 87-89. Pt is still drowsy from anesthesia but wakes easy to voice.  Dr Noralyn Pick at bedside.  Neb ordered, will continue to monitor.

## 2020-02-02 NOTE — Interval H&P Note (Signed)
History and Physical Interval Note:  02/02/2020 11:06 AM  Kimberly Vasquez  has presented today for surgery, with the diagnosis of R92.0 Abnormal mammogram with microcalcifications.  The various methods of treatment have been discussed with the patient and family. After consideration of risks, benefits and other options for treatment, the patient has consented to  Procedure(s): BREAST BIOPSY WITH RADIO FREQUENCY LOCALIZER (Right) as a surgical intervention.  The patient's history has been reviewed, patient examined, no change in status, stable for surgery.  I have reviewed the patient's chart and labs.  Right breast marked in the pre procedure room. Questions were answered to the patient's satisfaction.     Carolan Shiver

## 2020-02-02 NOTE — Transfer of Care (Signed)
Immediate Anesthesia Transfer of Care Note  Patient: Kimberly Vasquez  Procedure(s) Performed: BREAST BIOPSY WITH RADIO FREQUENCY LOCALIZER (Right Breast)  Patient Location: PACU  Anesthesia Type:General  Level of Consciousness: awake, drowsy and patient cooperative  Airway & Oxygen Therapy: Patient Spontanous Breathing and Patient connected to face mask oxygen  Post-op Assessment: Report given to RN and Post -op Vital signs reviewed and stable  Post vital signs: Reviewed and stable  Last Vitals:  Vitals Value Taken Time  BP 94/70 02/02/20 1307  Temp 36.7 C 02/02/20 1307  Pulse 89 02/02/20 1312  Resp 18 02/02/20 1312  SpO2 97 % 02/02/20 1312  Vitals shown include unvalidated device data.  Last Pain:  Vitals:   02/02/20 0936  TempSrc: Temporal  PainSc: 0-No pain         Complications: No complications documented.

## 2020-02-02 NOTE — Discharge Instructions (Signed)
Diet: Resume home heart healthy regular diet.   Activity: Increase activity as tolerated. Light activity and walking are encouraged. Do not drive or drink alcohol if taking narcotic pain medications.  Wound care: May shower with soapy water and pat dry (do not rub incisions), but no baths or submerging incision underwater until follow-up. (no swimming)   Medications: Resume all home medications. For mild to moderate pain: acetaminophen (Tylenol) or ibuprofen (if no kidney disease). Combining Tylenol with alcohol can substantially increase your risk of causing liver disease. Narcotic pain medications, if prescribed, can be used for severe pain, though may cause nausea, constipation, and drowsiness. Do not combine Tylenol and Norco within a 6 hour period as Norco contains Tylenol. If you do not need the narcotic pain medication, you do not need to fill the prescription.  Call office [phone]) at any time if any questions, worsening pain, fevers/chills, bleeding, drainage from incision site, or other concerns.   AMBULATORY SURGERY  DISCHARGE INSTRUCTIONS   1) The drugs that you were given will stay in your system until tomorrow so for the next 24 hours you should not:  A) Drive an automobile B) Make any legal decisions C) Drink any alcoholic beverage   2) You may resume regular meals tomorrow.  Today it is better to start with liquids and gradually work up to solid foods.  You may eat anything you prefer, but it is better to start with liquids, then soup and crackers, and gradually work up to solid foods.   3) Please notify your doctor immediately if you have any unusual bleeding, trouble breathing, redness and pain at the surgery site, drainage, fever, or pain not relieved by medication.  4) Your post-operative visit with Dr.                                     is: Date:                        Time:    Please call to schedule your post-operative visit.  5) Additional  Instructions:  Breast Biopsy, Care After These instructions give you information about caring for yourself after your procedure. Your doctor may also give you more specific instructions. Call your doctor if you have any problems or questions after your procedure. What can I expect after the procedure? After your procedure, it is common to have:  Bruising on your breast.  Numbness, tingling, or pain near your biopsy site. Follow these instructions at home: Medicines  Take over-the-counter and prescription medicines only as told by your doctor.  Do not drive for 24 hours if you were given a medicine to help you relax (sedative) during your procedure.  Do not drink alcohol while taking pain medicine.  Do not drive or use heavy machinery while taking prescription pain medicine. Biopsy site care      Follow instructions from your doctor about how to take care of your cut from surgery (incision) or your puncture area. Make sure you: ? Wash your hands with soap and water before you change your bandage (dressing). If you cannot use soap and water, use hand sanitizer. ? Change your bandage as told by your doctor. ? Leave stitches (sutures), skin glue, or skin tape (adhesive strips) in place. They may need to stay in place for 2 weeks or longer. If tape strips get loose and curl up,  you may trim the loose edges. Do not remove tape strips completely unless your doctor says it is okay.  If you have stitches, keep them dry when you take a bath or a shower.  Check your cut or puncture area every day for signs of infection. Check for: ? Redness, swelling, or pain. ? Fluid or blood. ? Warmth. ? Pus or a bad smell.  Protect the biopsy area. Do not let the area get bumped. Activity  If you had a cut during your procedure, avoid activities that could pull your cut open. These include: ? Stretching. ? Reaching over your head. ? Exercise. ? Sports. ? Lifting anything that weighs more than 3  lb (1.4 kg).  Return to your normal activities as told by your doctor. Ask your doctor what activities are safe for you. Managing pain, stiffness, and swelling If told, put ice on the biopsy site to relieve swelling:  Put ice in a plastic bag.  Place a towel between your skin and the bag.  Leave the ice on for 20 minutes, 2-3 times a day. General instructions  Continue your normal diet.  Wear a good support bra for as long as told by your doctor.  Get checked for extra fluid around your lymph nodes (lymphedema) as often as told by your doctor.  Keep all follow-up visits as told by your doctor. This is important. Contact a doctor if:  You notice any of the following at the biopsy site: ? More redness, swelling, or pain. ? More fluid or blood coming from the site. ? The site feels warm to the touch. ? Pus or a bad smell coming from the site. ? The site breaks open after the stitches or skin tape strips have been removed.  You have a rash.  You have a fever. Get help right away if:  You have more bleeding from the biopsy site. Get help right away if bleeding is more than a small spot.  You have trouble breathing.  You have red streaks around the biopsy site. Summary  After your procedure, it is common to have bruising, numbness, tingling, or pain near the biopsy site.  Do not drive or use heavy machinery while taking prescription pain medicine.  Wear a good support bra for as long as told by your doctor.  If you had a cut during your procedure, avoid activities that may pull the cut open. Ask your doctor what activities are safe for you. This information is not intended to replace advice given to you by your health care provider. Make sure you discuss any questions you have with your health care provider. Document Revised: 07/24/2017 Document Reviewed: 07/24/2017 Elsevier Patient Education  2020 [employer].

## 2020-02-02 NOTE — Anesthesia Procedure Notes (Signed)
Procedure Name: Intubation Date/Time: 02/02/2020 11:40 AM Performed by: Henrietta Hoover, CRNA Pre-anesthesia Checklist: Patient identified, Emergency Drugs available, Suction available and Patient being monitored Patient Re-evaluated:Patient Re-evaluated prior to induction Oxygen Delivery Method: Circle system utilized Preoxygenation: Pre-oxygenation with 100% oxygen Induction Type: IV induction, Cricoid Pressure applied and Rapid sequence Laryngoscope Size: 3 and McGraph Grade View: Grade I Tube type: Oral Tube size: 7.0 mm Number of attempts: 1 Airway Equipment and Method: Stylet and Video-laryngoscopy Placement Confirmation: ETT inserted through vocal cords under direct vision,  positive ETCO2 and breath sounds checked- equal and bilateral Secured at: 21 cm Tube secured with: Tape Dental Injury: Teeth and Oropharynx as per pre-operative assessment  Difficulty Due To: Difficulty was anticipated, Difficult Airway- due to anterior larynx and Difficult Airway- due to large tongue Future Recommendations: Recommend- induction with short-acting agent, and alternative techniques readily available Comments: Blankets under pt's shoulders to obtain sniffing position; RSI - did not attempt to mask ventilate.

## 2020-02-02 NOTE — Anesthesia Preprocedure Evaluation (Addendum)
Anesthesia Evaluation  Patient identified by MRN, date of birth, ID band Patient awake    Reviewed: Allergy & Precautions, NPO status , Patient's Chart, lab work & pertinent test results  Airway Mallampati: III  TM Distance: <3 FB     Dental   Pulmonary asthma , sleep apnea , former smoker,    Pulmonary exam normal        Cardiovascular negative cardio ROS Normal cardiovascular exam     Neuro/Psych negative neurological ROS  negative psych ROS   GI/Hepatic Neg liver ROS, GERD  ,  Endo/Other  diabetesMorbid obesity  Renal/GU negative Renal ROS  Female GU complaint     Musculoskeletal negative musculoskeletal ROS (+)   Abdominal (+) + obese,   Peds negative pediatric ROS (+)  Hematology negative hematology ROS (+)   Anesthesia Other Findings Past Medical History: No date: Allergic rhinitis No date: Asthma without status asthmaticus [id number]: Diabetes mellitus without complication (HCC)     Comment:  gestational No date: GERD (gastroesophageal reflux disease) No date: Hyperlipidemia No date: IGT (impaired glucose tolerance) No date: Morbid obesity (HCC) No date: Obesity No date: Osteopenia No date: Sleep apnea No date: Vertigo No date: Vitamin D deficiency  Reproductive/Obstetrics                             Anesthesia Physical Anesthesia Plan  ASA: III  Anesthesia Plan: General   Post-op Pain Management:    Induction: Intravenous, Rapid sequence and Cricoid pressure planned  PONV Risk Score and Plan:   Airway Management Planned: Oral ETT and Video Laryngoscope Planned  Additional Equipment:   Intra-op Plan:   Post-operative Plan: Extubation in OR  Informed Consent: I have reviewed the patients History and Physical, chart, labs and discussed the procedure including the risks, benefits and alternatives for the proposed anesthesia with the patient or authorized  representative who has indicated his/her understanding and acceptance.     Dental advisory given  Plan Discussed with: CRNA and Surgeon  Anesthesia Plan Comments:         Anesthesia Quick Evaluation

## 2020-02-03 ENCOUNTER — Encounter: Payer: Self-pay | Admitting: General Surgery

## 2020-02-04 LAB — SURGICAL PATHOLOGY

## 2020-03-03 ENCOUNTER — Telehealth: Payer: Self-pay | Admitting: Internal Medicine

## 2020-03-03 NOTE — Telephone Encounter (Signed)
Spoke to [place] with [facility], who is requesting Virgel Bouquet Rx to be faxed to [facility].  Patient last seen 01/28/2019. Patient is past due for an appointment.  I have made Dominique aware that an appt is needed prior to refills.   Lm for patient to schedule.

## 2020-03-06 ENCOUNTER — Ambulatory Visit: Payer: Self-pay | Admitting: Pulmonary Disease

## 2020-03-07 ENCOUNTER — Encounter: Payer: Self-pay | Admitting: Emergency Medicine

## 2020-03-07 ENCOUNTER — Ambulatory Visit
Admission: EM | Admit: 2020-03-07 | Discharge: 2020-03-07 | Disposition: A | Discharge status: Home / Self Care | Payer: Self-pay | Attending: Family Medicine | Admitting: Family Medicine

## 2020-03-07 ENCOUNTER — Other Ambulatory Visit: Payer: Self-pay

## 2020-03-07 DIAGNOSIS — H1031 Unspecified acute conjunctivitis, right eye: Secondary | ICD-10-CM

## 2020-03-07 DIAGNOSIS — H00011 Hordeolum externum right upper eyelid: Secondary | ICD-10-CM

## 2020-03-07 MED ORDER — MOXIFLOXACIN HCL 0.5 % OP SOLN: 1.0000 [drp] | Freq: Three times a day (TID) | OPHTHALMIC | 0 refills | Status: AC

## 2020-03-07 NOTE — ED Provider Notes (Signed)
[facility]    CSN: [id number] Arrival date & time: 03/07/20  0800      History   Chief Complaint Chief Complaint  Patient presents with  . Eye Problem    right   HPI  60 year old female presents with the above complaint.  3-day history of symptoms.  Reports right eye swelling.  Associated eye redness and discharge.  She has been applying warm compresses and using over-the-counter eyedrops without resolution.  No known inciting factor.  No fever.  No relieving factors.  No other reported symptoms.  No other complaints.  Past Medical History:  Diagnosis Date  . Allergic rhinitis   . Asthma without status asthmaticus   . Diabetes mellitus without complication (HCC) 0272,5366   gestational  . GERD (gastroesophageal reflux disease)   . Hyperlipidemia   . IGT (impaired glucose tolerance)   . Morbid obesity (HCC)   . Obesity   . Osteopenia   . Sleep apnea   . Vertigo   . Vitamin D deficiency     Patient Active Problem List   Diagnosis Date Noted  . Asthma without status asthmaticus 11/16/2015  . IGT (impaired glucose tolerance) 11/16/2015  . Gastroesophageal reflux disease 11/14/2015  . Snoring 11/14/2015  . URI (upper respiratory infection) 04/19/2015  . Morbid obesity (HCC) 08/15/2014  . Extrinsic asthma 06/14/2014    Past Surgical History:  Procedure Laterality Date  . ABDOMINAL HYSTERECTOMY  2001  . BREAST BIOPSY Right 01/06/2020   stereo bx, ribbon clip, ATYPICAL GLANDULAR PROLIFERATION  . BREAST BIOPSY WITH RADIO FREQUENCY LOCALIZER Right 02/02/2020   Procedure: BREAST BIOPSY WITH RADIO FREQUENCY LOCALIZER;  Surgeon: Carolan Shiver, MD;  Location: [facility] ORS;  Service: General;  Laterality: Right;  . BREAST CYST ASPIRATION Left   . BREAST EXCISIONAL BIOPSY Right 1993   benign, unable to see scar   . BREAST EXCISIONAL BIOPSY Left 1979   benign  . CHOLECYSTECTOMY    . ESOPHAGOGASTRODUODENOSCOPY (EGD) WITH PROPOFOL N/A 01/02/2016    Procedure: ESOPHAGOGASTRODUODENOSCOPY (EGD) WITH PROPOFOL;  Surgeon: Midge Minium, MD;  Location: [facility] ENDOSCOPY;  Service: Endoscopy;  Laterality: N/A;    OB History   No obstetric history on file.      Home Medications    Prior to Admission medications   Medication Sig Start Date End Date Taking? Authorizing Provider  albuterol (PROVENTIL) (2.5 MG/3ML) 0.083% nebulizer solution Take 3 mLs (2.5 mg total) by nebulization every 6 (six) hours as needed for wheezing or shortness of breath. 01/28/19  Yes Kasa, Wallis Bamberg, MD  albuterol (VENTOLIN HFA) 108 (90 Base) MCG/ACT inhaler Inhale 1-2 puffs into the lungs every 6 (six) hours as needed for wheezing or shortness of breath. 01/28/19  Yes Erin Fulling, MD  cetirizine (ZYRTEC) 10 MG tablet Take 10 mg by mouth daily.   Yes [provider]  diphenhydramine-acetaminophen (TYLENOL PM) 25-500 MG TABS tablet Take 1 tablet by mouth at bedtime.   Yes [provider]  fluticasone furoate-vilanterol (BREO ELLIPTA) 100-25 MCG/INH AEPB Inhale 1 puff into the lungs daily. 03/12/19  Yes Kasa, Wallis Bamberg, MD  moxifloxacin (VIGAMOX) 0.5 % ophthalmic solution Place 1 drop into the right eye 3 (three) times daily for 7 days. 03/07/20 03/14/20 Yes Rithwik Schmieg G, DO  omeprazole (PRILOSEC) 20 MG capsule Take 20 mg by mouth every evening.    Yes [provider]  triamcinolone (NASACORT) 55 MCG/ACT AERO nasal inhaler Place 2 sprays into the nose daily.   Yes [provider]  Family History Family History  Problem Relation Age of Onset  . Heart disease Mother   . Asthma Mother   . Diabetes Mother   . Stroke Father   . Breast cancer Paternal Grandmother 44    Social History Social History   Tobacco Use  . Smoking status: Former Smoker    Packs/day: 0.50    Years: 8.00    Pack years: 4.00    Types: Cigarettes    Quit date: 2000    Years since quitting: 22.0  . Smokeless tobacco: Never Used  . Tobacco comment: quit smoking in  2000  Vaping Use  . Vaping Use: Never used  Substance Use Topics  . Alcohol use: Yes    Alcohol/week: 0.0 standard drinks    Comment: occasional  . Drug use: No     Allergies   Codeine   Review of Systems Review of Systems  Constitutional: Negative.   Eyes: Positive for discharge and redness.   Physical Exam Triage Vital Signs ED Triage Vitals  Enc Vitals Group     BP 03/07/20 0815 (!) 154/83     Pulse Rate 03/07/20 0815 88     Resp 03/07/20 0815 18     Temp 03/07/20 0815 98.3 F (36.8 C)     Temp Source 03/07/20 0815 Oral     SpO2 03/07/20 0815 96 %     Weight 03/07/20 0816 (!) 324 lb (147 kg)     Height 03/07/20 0816 5' 7.5" (1.715 m)     Head Circumference --      Peak Flow --      Pain Score 03/07/20 0815 3     Pain Loc --      Pain Edu? --      Excl. in GC? --    Updated Vital Signs BP (!) 154/83 (BP Location: Other (Comment)) Comment (BP Location): left forearm  Pulse 88   Temp 98.3 F (36.8 C) (Oral)   Resp 18   Ht 5' 7.5" (1.715 m)   Wt (!) 147 kg   LMP 07/18/2000 (Approximate)   SpO2 96%   BMI 50.00 kg/m   Visual Acuity Right Eye Distance: 20/50 Left Eye Distance: 20/25 Bilateral Distance: 20/25  Right Eye Near:   Left Eye Near:    Bilateral Near:     Physical Exam Vitals and nursing note reviewed.  Constitutional:      General: She is not in acute distress.    Appearance: Normal appearance. She is not ill-appearing.  HENT:     Head: Normocephalic and atraumatic.  Eyes:     Comments: Edema of the right upper eyelid.  Conjunctival injection of the right eye.  Pulmonary:     Effort: Pulmonary effort is normal. No respiratory distress.  Neurological:     Mental Status: She is alert.  Psychiatric:        Mood and Affect: Mood normal.        Behavior: Behavior normal.    UC Treatments / Results  Labs (all labs ordered are listed, but only abnormal results are displayed) Labs Reviewed - No data to display  EKG   Radiology No  results found.  Procedures Procedures (including critical care time)  Medications Ordered in UC Medications - No data to display  Initial Impression / Assessment and Plan / UC Course  I have reviewed the triage vital signs and the nursing notes.  Pertinent labs & imaging results that were available during my care of the  patient were reviewed by me and considered in my medical decision making (see chart for details).    60 year old female presents with stye of the right upper eyelid.  Patient also has conjunctival injection and has had some discharge.  Placing on Vigamox.  Warm compresses.  Final Clinical Impressions(s) / UC Diagnoses   Final diagnoses:  Hordeolum externum of right upper eyelid  Acute conjunctivitis of right eye, unspecified acute conjunctivitis type     Discharge Instructions     Continue warm compresses.  Medication as prescribed.  If persists, call [facility] for an appt.  Take care  Dr. Adriana Simas   ED Prescriptions    Medication Sig Dispense Auth. Provider   moxifloxacin (VIGAMOX) 0.5 % ophthalmic solution Place 1 drop into the right eye 3 (three) times daily for 7 days. 3 mL Tommie Sams, DO     PDMP not reviewed this encounter.   Tommie Sams, DO 03/07/20 0830

## 2020-03-07 NOTE — ED Triage Notes (Signed)
Patient in today c/o right eye redness, drainage and swelling x 3 days worse yesterday. Patient has been applying warm compresses and has used visine eye drops.

## 2020-03-07 NOTE — Telephone Encounter (Signed)
Called and spoke to pt. Appt made with Elisha Headland, NP, for 03/17/20. Pt states she has 60 days left of her current medication but needs pt assistance to continue taking her inhalers. Pt will bring the paperwork to the appt and will need Dr. Belia Heman to sign it as it has his name printed on the forms.   Will forward to [facility] to make aware.

## 2020-03-07 NOTE — Discharge Instructions (Signed)
Continue warm compresses.  Medication as prescribed.  If persists, call [facility] for an appt.  Take care  Dr. Adriana Simas

## 2020-03-07 NOTE — Telephone Encounter (Signed)
Noted  

## 2020-03-09 ENCOUNTER — Encounter: Payer: Self-pay | Admitting: *Deleted

## 2020-03-09 NOTE — Progress Notes (Signed)
Patient with benign pathology.  Per Dr. Maia Plan, patient will follow up in one year with annual screening.

## 2020-03-17 ENCOUNTER — Encounter: Payer: Self-pay | Admitting: Pulmonary Disease

## 2020-03-17 ENCOUNTER — Ambulatory Visit (INDEPENDENT_AMBULATORY_CARE_PROVIDER_SITE_OTHER): Payer: Self-pay | Admitting: Pulmonary Disease

## 2020-03-17 ENCOUNTER — Other Ambulatory Visit: Payer: Self-pay

## 2020-03-17 ENCOUNTER — Telehealth: Payer: Self-pay | Admitting: Internal Medicine

## 2020-03-17 ENCOUNTER — Ambulatory Visit
Admission: RE | Admit: 2020-03-17 | Discharge: 2020-03-17 | Disposition: A | Discharge status: Home / Self Care | Payer: Self-pay | Source: Ambulatory Visit | Attending: Pulmonary Disease | Admitting: Pulmonary Disease

## 2020-03-17 ENCOUNTER — Other Ambulatory Visit: Payer: Self-pay | Admitting: Pulmonary Disease

## 2020-03-17 VITALS — BP 132/78 | HR 82 | Temp 97.1°F | Ht 67.5 in | Wt 325.2 lb

## 2020-03-17 DIAGNOSIS — R0602 Shortness of breath: Secondary | ICD-10-CM | POA: Insufficient documentation

## 2020-03-17 DIAGNOSIS — J4520 Mild intermittent asthma, uncomplicated: Secondary | ICD-10-CM

## 2020-03-17 DIAGNOSIS — Z8616 Personal history of COVID-19: Secondary | ICD-10-CM

## 2020-03-17 DIAGNOSIS — G4733 Obstructive sleep apnea (adult) (pediatric): Secondary | ICD-10-CM | POA: Insufficient documentation

## 2020-03-17 DIAGNOSIS — K219 Gastro-esophageal reflux disease without esophagitis: Secondary | ICD-10-CM

## 2020-03-17 MED ORDER — FLUTICASONE FUROATE-VILANTEROL 100-25 MCG/INH IN AEPB: 1.0000 | INHALATION_SPRAY | Freq: Every day | RESPIRATORY_TRACT | 3 refills | Status: DC

## 2020-03-17 NOTE — Patient Instructions (Addendum)
You were seen today by Coral Ceo, NP  for:   1. Shortness of breath 2. History of COVID-19  - DG Chest 2 View; Future  Chest x-ray today  as discussed today I believe we need to work on increasing your overall physical conditioning  work on increasing your physical activity to improve your conditioning as well as reducing your BMI  3. Mild intermittent extrinsic asthma without complication  Let us trial stopping your Breo Ellipta 100  please let us know how you are doing in about 2 weeks  4. Gastroesophageal reflux disease, unspecified whether esophagitis present  Continue acid reflux medications  GERD management: >>>Avoid laying flat until 2 hours after meals >>>Elevate head of the bed including entire chest >>>Reduce size of meals and amount of fat, acid, spices, caffeine and sweets >>>If you are smoking, Please stop! >>>Decrease alcohol consumption >>>Work on maintaining a healthy weight with normal BMI    5. Morbid obesity (HCC)  We will set a tentative plan and goal of reducing weight over the following year by about 30 pounds  start by increasing overall physical activity  keep up the hard work  6. OSA (obstructive sleep apnea)  We will refill supplies next keep up hard work with using CPAP  We recommend that you continue using your CPAP daily >>>Keep up the hard work using your device >>> Goal should be wearing this for the entire night that you are sleeping, at least 4 to 6 hours  Remember:  . Do not drive or operate heavy machinery if tired or drowsy.  . Please notify the supply company and office if you are unable to use your device regularly due to missing supplies or machine being broken.  . Work on maintaining a healthy weight and following your recommended nutrition plan  . Maintain proper daily exercise and movement  . Maintaining proper use of your device can also help improve management of other chronic illnesses such as: Blood pressure, blood  sugars, and weight management.   BiPAP/ CPAP Cleaning:  >>>Clean weekly, with Dawn soap, and bottle brush.  Set up to air dry. >>> Wipe mask out daily with wet wipe or towelette     We recommend today:  Orders Placed This Encounter  Procedures  . DG Chest 2 View    Standing Status:   Future    Standing Expiration Date:   07/15/2020    Order Specific Question:   Reason for Exam (SYMPTOM  OR DIAGNOSIS REQUIRED)    Answer:   doe    Order Specific Question:   Preferred imaging location?    Answer:   Internal    Order Specific Question:   Radiology Contrast Protocol - do NOT remove file path    Answer:   [email or web address]   Orders Placed This Encounter  Procedures  . DG Chest 2 View   Meds ordered this encounter  Medications  . fluticasone furoate-vilanterol (BREO ELLIPTA) 100-25 MCG/INH AEPB    Sig: Inhale 1 puff into the lungs daily.    Dispense:  3 each    Refill:  3    Follow Up:    Return in about 6 months (around 09/14/2020), or if symptoms worsen or fail to improve, for [facility] - Dr. Belia Heman.   Notification of test results are managed in the following manner: If there are  any recommendations or changes to the  plan of care discussed in office today,  we will contact you and let you  know what they are. If you do not hear from [place], then your results are normal and you can view them through your  MyChart account , or a letter will be sent to you. Thank you again for trusting [place] with your care  - Thank you, [facility]    It is flu season:   >>> Best ways to protect herself from the flu: Receive the yearly flu vaccine, practice good hand hygiene washing with soap and also using hand sanitizer when available, eat a nutritious meals, get adequate rest, hydrate appropriately       Please contact the office if your symptoms worsen or you have concerns that you are not improving.   Thank you for choosing  [facility] for your healthcare, and for allowing [place] to partner with you on your healthcare journey. I am thankful to be able to provide care to you today.   Elisha Headland FNP-C

## 2020-03-17 NOTE — Assessment & Plan Note (Signed)
Post Covid 11/2019, received MAB. No SABA use in over a year. Right basilar rales noted.    Plan:  CXR today Use SABA PRN

## 2020-03-17 NOTE — Assessment & Plan Note (Signed)
Plan: Discussed the importance of weight reduction. She is agreeable to work towards a 30lb reduction this year. Start slow with a gradual increase of exercise weekly.

## 2020-03-17 NOTE — Assessment & Plan Note (Signed)
Compliance report 02/16/20-03/16/20: 30/30 day usage, 0.3 AHI. Patient would like [place] to assume care for her OSA not ENT.   Plan: Continue current CPAP settings. Reorder supplies.

## 2020-03-17 NOTE — Assessment & Plan Note (Addendum)
Stable, mild intermittent asthma. No SABA use in over 1 year. PFT 2016 showed mild restriction.   Plan:  Trial of 2 weeks off Breo. Call office with update in 2 weeks or sooner if resumed due to symptoms. Continue to use SABA inhaler as needed.   Encourage increase activity weekly. Goal to decrease weight by 30 lbs this year.

## 2020-03-17 NOTE — Progress Notes (Signed)
@Patient  ID: , female    DOB: 14-Nov-1960, 60 y.o.   MRN: [id number]  Chief Complaint  Patient presents with  . Follow-up    C/o sob with exertion.     Referring provider: No ref. provider found  HPI:  60 year old female former smoker followed in our office for asthma, obstructive sleep apnea  PMH:.  Obesity, GERD Smoker/ Smoking History: Former smoker.  Quit 2000.  4-pack-year smoking Maintenance: Breo 100 Pt of: DK  03/17/2020  - Visit   60 year old female former smoker followed by DK.  Last seen in our office in 01/2019 this was a telephone visit.  Managed in our office for asthma, obstructive sleep apnea and GERD.  Patient utilizing CPAP and this is managed by ENT per last documentation.  At last office visit patient was encouraged to work on weight loss, we are going to obtain a sleep apnea report to switch over compliance here.  Continue Breo Ellipta for mild intermittent asthma.  Patient encouraged to have follow-up in 1 year.  Patient complaining follow-up today  Patient doing well. Maintained on Breo. Last SABA use over 1 year ago. No recent ABX or steroids.   CPAP compliance report 02/16/20-03/16/20: 30/30 day usage, 0.3 AHI. She wants 03/18/20 to assume responsibility for OSA not ENT.    Covid positive 10/21 - Covid positive soon after 2nd vaccination,booster due in March. Received MAB 11/22/19, states 24 hours after infusion sense of taste and smell returned.    Tests:   FENO:  No results found for: NITRICOXIDE  PFT: PFT Results Latest Ref Rng & Units 08/15/2014  FVC-Pre L 2.82  FVC-Predicted Pre % 73  FVC-Post L 2.87  FVC-Predicted Post % 74  Pre FEV1/FVC % % 77  Post FEV1/FCV % % 78  FEV1-Pre L 2.17  FEV1-Predicted Pre % 72  FEV1-Post L 2.22  DLCO uncorrected ml/min/mmHg 33.37  DLCO UNC% % 115  DLVA Predicted % 108    WALK:  SIX MIN WALK 08/15/2014  Medications Claritin, Advair @ 10am  Supplimental Oxygen during Test? (L/min) No  Laps 7   Partial Lap (in Meters) 9  Baseline BP (sitting) 134/80  Baseline Heartrate 82  Baseline Dyspnea (Borg Scale) 2  Baseline Fatigue (Borg Scale) 2  Baseline SPO2 93  BP (sitting) 148/88  Heartrate 107  Dyspnea (Borg Scale) 3  Fatigue (Borg Scale) 3  SPO2 96  BP (sitting) 132/84  Heartrate 93  SPO2 96  Stopped or Paused before Six Minutes No  Distance Completed 345  Tech Comments: Pt walked at moderate pace w/o complications.  Provider Comments: Noted, see clinic note    Imaging: No results found.  Lab Results:  CBC No results found for: WBC, RBC, HGB, HCT, PLT, MCV, MCH, MCHC, RDW, LYMPHSABS, MONOABS, EOSABS, BASOSABS  BMET No results found for: NA, K, CL, CO2, GLUCOSE, BUN, CREATININE, CALCIUM, GFRNONAA, GFRAA  BNP No results found for: BNP  ProBNP No results found for: PROBNP  Specialty Problems      Pulmonary Problems   Extrinsic asthma    Pulmonary function testing 08/15/2014 FVC 73% FEV1 72% FEV1/FVC 77% FRC 41% RV 51% TLC 65% RV/TLC 76% Impression: Moderate obstruction with no stomach and broncho-dilator response. Severe to moderate decrease in RV and TLC's, suggesting a restrictive component, severe reduction in ERV, these findings together can be seen in abdominal/chest wall obesity. Clinical correlation is considered/advised. 6 minute walk test: 1131 feet/345 m, low saturation 93%, highest heart rate 107.  URI (upper respiratory infection)   Snoring   Asthma without status asthmaticus   OSA (obstructive sleep apnea)   Shortness of breath      Allergies  Allergen Reactions  . Codeine Nausea And Vomiting    Can't take it in pill form    Immunization History  Administered Date(s) Administered  . Influenza,inj,Quad PF,6+ Mos 12/08/2014, 12/09/2017  . PFIZER(Purple Top)SARS-COV-2 Vaccination 10/26/2019, 11/16/2019    Past Medical History:  Diagnosis Date  . Allergic rhinitis   . Asthma without status asthmaticus   . Diabetes  mellitus without complication (HCC) 6967,8938   gestational  . GERD (gastroesophageal reflux disease)   . Hyperlipidemia   . IGT (impaired glucose tolerance)   . Morbid obesity (HCC)   . Obesity   . Osteopenia   . Sleep apnea   . Vertigo   . Vitamin D deficiency     Tobacco History: Social History   Tobacco Use  Smoking Status Former Smoker  . Packs/day: 1.00  . Years: 8.00  . Pack years: 8.00  . Types: Cigarettes  . Quit date: 2000  . Years since quitting: 22.0  Smokeless Tobacco Never Used  Tobacco Comment   quit smoking in 2000   Counseling given: Not Answered Comment: quit smoking in 2000   Continue to not smoke  Outpatient Encounter Medications as of 03/17/2020  Medication Sig  . albuterol (PROVENTIL) (2.5 MG/3ML) 0.083% nebulizer solution Take 3 mLs (2.5 mg total) by nebulization every 6 (six) hours as needed for wheezing or shortness of breath.  Marland Kitchen albuterol (VENTOLIN HFA) 108 (90 Base) MCG/ACT inhaler Inhale 1-2 puffs into the lungs every 6 (six) hours as needed for wheezing or shortness of breath.  . cetirizine (ZYRTEC) 10 MG tablet Take 10 mg by mouth daily.  . diphenhydramine-acetaminophen (TYLENOL PM) 25-500 MG TABS tablet Take 1 tablet by mouth at bedtime.  Marland Kitchen omeprazole (PRILOSEC) 20 MG capsule Take 20 mg by mouth every evening.   . triamcinolone (NASACORT) 55 MCG/ACT AERO nasal inhaler Place 2 sprays into the nose daily.  . [DISCONTINUED] fluticasone furoate-vilanterol (BREO ELLIPTA) 100-25 MCG/INH AEPB Inhale 1 puff into the lungs daily.  . fluticasone furoate-vilanterol (BREO ELLIPTA) 100-25 MCG/INH AEPB Inhale 1 puff into the lungs daily.   No facility-administered encounter medications on file as of 03/17/2020.     Review of Systems  Review of Systems  Constitutional: Negative.   HENT: Negative.   Respiratory: Positive for shortness of breath (with exertion ).   Cardiovascular: Negative.   Gastrointestinal: Negative.   Neurological: Negative.    Psychiatric/Behavioral: Negative.      Physical Exam  BP 132/78 (BP Location: Left Arm, Cuff Size: Normal)   Pulse 82   Temp (!) 97.1 F (36.2 C) (Temporal)   Ht 5' 7.5" (1.715 m)   Wt (!) 325 lb 3.2 oz (147.5 kg)   LMP 07/18/2000 (Approximate)   SpO2 97%   BMI 50.18 kg/m   Wt Readings from Last 5 Encounters:  03/17/20 (!) 325 lb 3.2 oz (147.5 kg)  03/07/20 (!) 324 lb (147 kg)  02/02/20 (!) 324 lb 1.2 oz (147 kg)  01/27/20 (!) 324 lb (147 kg)  10/27/18 (!) 326 lb (147.9 kg)    BMI Readings from Last 5 Encounters:  03/17/20 50.18 kg/m  03/07/20 50.00 kg/m  02/02/20 50.01 kg/m  01/27/20 50.00 kg/m  10/27/18 50.31 kg/m     Physical Exam Constitutional:      Appearance: Normal appearance. She is obese.  HENT:  Head: Normocephalic.     Nose: Nose normal.  Cardiovascular:     Rate and Rhythm: Normal rate and regular rhythm.     Pulses: Normal pulses.     Heart sounds: Normal heart sounds.  Pulmonary:     Breath sounds: Rales (right post. basilar) present.  Skin:    General: Skin is warm and dry.  Neurological:     General: No focal deficit present.     Mental Status: She is alert.  Psychiatric:        Mood and Affect: Mood normal.        Behavior: Behavior normal.       Assessment & Plan:   History of COVID-19 Received MAB 11/22/19. Due for booster vaccine in March.   Plan: CXR today   OSA (obstructive sleep apnea) Compliance report 02/16/20-03/16/20: 30/30 day usage, 0.3 AHI. Patient would like [place] to assume care for her OSA not ENT.   Plan: Continue current CPAP settings. Reorder supplies.   Extrinsic asthma Stable, mild intermittent asthma. No SABA use in over 1 year. PFT 2016 showed mild restriction.   Plan:  Trial of 2 weeks off Breo. Call office with update in 2 weeks or sooner if resumed due to symptoms. Continue to use SABA inhaler as needed.   Encourage increase activity weekly. Goal to decrease weight by 30 lbs this year.     Gastroesophageal reflux disease Stable on OTC Omeprazole.   Plan: Continue with current regimen.   Morbid obesity (HCC) Plan: Discussed the importance of weight reduction. She is agreeable to work towards a 30lb reduction this year. Start slow with a gradual increase of exercise weekly.   Shortness of breath Post Covid 11/2019, received MAB. No SABA use in over a year. Right basilar rales noted.    Plan:  CXR today Use SABA PRN    Return in about 6 months (around 09/14/2020), or if symptoms worsen or fail to improve, for [facility] - Dr. Belia Heman.   Coral Ceo, NP 03/17/2020   This appointment required 32 minutes of patient care (this includes precharting, chart review, review of results, face-to-face care, etc.).

## 2020-03-17 NOTE — Assessment & Plan Note (Signed)
Stable on OTC Omeprazole.   Plan: Continue with current regimen.

## 2020-03-17 NOTE — Assessment & Plan Note (Signed)
Received MAB 11/22/19. Due for booster vaccine in March.   Plan: CXR today

## 2020-03-17 NOTE — Telephone Encounter (Signed)
Patient dropped off Breo patient assistance forms.  Forms have been placed in Dr. Clovis Fredrickson folder for signature.

## 2020-03-23 ENCOUNTER — Telehealth: Payer: Self-pay | Admitting: Internal Medicine

## 2020-03-23 NOTE — Telephone Encounter (Signed)
At last OV, patient dropped off patient assistant forms for [facility].  Forms have been completed by Dr. Belia Heman.  Patient was instructed at last OV to hold [facility] for 2 weeks. Patient will call if she wishes to resume Breo.  If patient resumes Virgel Bouquet, forms will need to be mailed to prescription lifetime.  I will hold onto forms until receiving update from patient.

## 2020-03-23 NOTE — Telephone Encounter (Signed)
Please see 03/23/2020 phone note.

## 2020-03-28 ENCOUNTER — Ambulatory Visit
Admission: RE | Admit: 2020-03-28 | Discharge: 2020-03-28 | Disposition: A | Discharge status: Home / Self Care | Payer: Self-pay | Source: Ambulatory Visit | Attending: Pulmonary Disease | Admitting: Pulmonary Disease

## 2020-03-28 ENCOUNTER — Telehealth: Payer: Self-pay | Admitting: Internal Medicine

## 2020-03-28 ENCOUNTER — Other Ambulatory Visit: Payer: Self-pay

## 2020-03-28 DIAGNOSIS — R0602 Shortness of breath: Secondary | ICD-10-CM | POA: Insufficient documentation

## 2020-03-28 NOTE — Telephone Encounter (Signed)
Rx for Kimberly Vasquez and patient assistant forms have been mailed to prescriptions lifeline.  Patient is aware and voiced her understanding.  Nothing further is needed.

## 2020-03-28 NOTE — Telephone Encounter (Signed)
Check COVID-19 test Call if your results are positive Continue on Mucinex DM twice daily as needed for cough or congestion Restart Breo 1 puff daily, rinse after use If symptoms are not improving or worsen will need to call back or have office visit for further evaluation and treatment options  Please contact office for sooner follow up if symptoms do not improve or worsen or seek emergency care

## 2020-03-28 NOTE — Telephone Encounter (Signed)
Dr. Belia Heman patient last seen Kimberly Headland, NP on 03/17/2020 for Asthma. Pending recall for 08/2020  Called and spoke to patient.  Patient stated that she was instructed to hold Breo at last visit on 03/17/2020. Over the weekend she developed wheezing, increased sob and productive cough with yellowish sputum Denied f/s/c or additional symptoms.  She has had two covid vaccines. She has not had covid booster or flu.  She has not used albuterol.  She is taking mucinex DM with some relief in sx.   Tammy, please advise as Dr. Belia Heman is unavailable. Thanks

## 2020-03-28 NOTE — Telephone Encounter (Signed)
Patient is aware of below recommendations and voiced her understanding. Nothing further needed at this time.   

## 2020-03-29 ENCOUNTER — Other Ambulatory Visit: Payer: Self-pay

## 2020-04-02 ENCOUNTER — Ambulatory Visit
Admission: EM | Admit: 2020-04-02 | Discharge: 2020-04-02 | Disposition: A | Discharge status: Home / Self Care | Payer: Self-pay | Attending: Emergency Medicine | Admitting: Emergency Medicine

## 2020-04-02 ENCOUNTER — Other Ambulatory Visit: Payer: Self-pay

## 2020-04-02 DIAGNOSIS — J4521 Mild intermittent asthma with (acute) exacerbation: Secondary | ICD-10-CM

## 2020-04-02 DIAGNOSIS — J4520 Mild intermittent asthma, uncomplicated: Secondary | ICD-10-CM

## 2020-04-02 MED ORDER — PREDNISONE 20 MG PO TABS: 40.0000 mg | ORAL_TABLET | Freq: Every day | ORAL | 0 refills | Status: AC

## 2020-04-02 MED ORDER — ALBUTEROL SULFATE (2.5 MG/3ML) 0.083% IN NEBU: 2.5000 mg | INHALATION_SOLUTION | Freq: Four times a day (QID) | RESPIRATORY_TRACT | 0 refills | Status: AC | PRN

## 2020-04-02 NOTE — ED Provider Notes (Signed)
[facility]    CSN: [id number] Arrival date & time: 04/02/20  1427      History   Chief Complaint Chief Complaint  Patient presents with  . Shortness of Breath    HPI Kimberly Vasquez is a 60 y.o. female.   Kimberly Vasquez presents with complaints of wheezing and shortness of breath . Started last week. She had seen pulmonology and her provided had decided to trial being off of her Breo inhaler. She states she was told she had crackles to her left lung base so was referred to xray and then CT. Has since developed increased wheezing and shortness of breath . No fevers headache or other URI symptoms. She tested negative for covid-19 two days ago. She did have covid back in October of 2021. Has since restarted her breo and has been using her nebulizer's about once a day which provides some temporary relief. No known ill contacts. Former smoker.     ROS per HPI, negative if not otherwise mentioned.      Past Medical History:  Diagnosis Date  . Allergic rhinitis   . Asthma without status asthmaticus   . Diabetes mellitus without complication (HCC) 6962,9528   gestational  . GERD (gastroesophageal reflux disease)   . Hyperlipidemia   . IGT (impaired glucose tolerance)   . Morbid obesity (HCC)   . Obesity   . Osteopenia   . Sleep apnea   . Vertigo   . Vitamin D deficiency     Patient Active Problem List   Diagnosis Date Noted  . OSA (obstructive sleep apnea) 03/17/2020  . Shortness of breath 03/17/2020  . History of COVID-19 03/17/2020  . Asthma without status asthmaticus 11/16/2015  . IGT (impaired glucose tolerance) 11/16/2015  . Gastroesophageal reflux disease 11/14/2015  . Snoring 11/14/2015  . URI (upper respiratory infection) 04/19/2015  . Morbid obesity (HCC) 08/15/2014  . Extrinsic asthma 06/14/2014    Past Surgical History:  Procedure Laterality Date  . ABDOMINAL HYSTERECTOMY  2001  . BREAST BIOPSY Right 01/06/2020   stereo bx, ribbon  clip, ATYPICAL GLANDULAR PROLIFERATION  . BREAST BIOPSY WITH RADIO FREQUENCY LOCALIZER Right 02/02/2020   Procedure: BREAST BIOPSY WITH RADIO FREQUENCY LOCALIZER;  Surgeon: Carolan Shiver, MD;  Location: [facility];  Service: General;  Laterality: Right;  . BREAST CYST ASPIRATION Left   . BREAST EXCISIONAL BIOPSY Right 1993   benign, unable to see scar   . BREAST EXCISIONAL BIOPSY Left 1979   benign  . CHOLECYSTECTOMY    . ESOPHAGOGASTRODUODENOSCOPY (EGD) WITH PROPOFOL N/A 01/02/2016   Procedure: ESOPHAGOGASTRODUODENOSCOPY (EGD) WITH PROPOFOL;  Surgeon: Midge Minium, MD;  Location: [facility];  Service: Endoscopy;  Laterality: N/A;    OB History   No obstetric history on file.      Home Medications    Prior to Admission medications   Medication Sig Start Date End Date Taking? Authorizing Provider  albuterol (VENTOLIN HFA) 108 (90 Base) MCG/ACT inhaler Inhale 1-2 puffs into the lungs every 6 (six) hours as needed for wheezing or shortness of breath. 01/28/19  Yes Erin Fulling, MD  cetirizine (ZYRTEC) 10 MG tablet Take 10 mg by mouth daily.   Yes [provider]  diphenhydramine-acetaminophen (TYLENOL PM) 25-500 MG TABS tablet Take 1 tablet by mouth at bedtime.   Yes [provider]  fluticasone furoate-vilanterol (BREO ELLIPTA) 100-25 MCG/INH AEPB Inhale 1 puff into the lungs daily. 03/17/20  Yes Erin Fulling, MD  omeprazole (PRILOSEC) 20 MG capsule Take  20 mg by mouth every evening.    Yes [provider]  predniSONE (DELTASONE) 20 MG tablet Take 2 tablets (40 mg total) by mouth daily with breakfast for 5 days. 04/02/20 04/07/20 Yes Burky, Barron Alvine, NP  triamcinolone (NASACORT) 55 MCG/ACT AERO nasal inhaler Place 2 sprays into the nose daily.   Yes [provider]  albuterol (PROVENTIL) (2.5 MG/3ML) 0.083% nebulizer solution Take 3 mLs (2.5 mg total) by nebulization every 6 (six) hours as needed for wheezing or shortness of breath. 04/02/20    Georgetta Haber, NP    Family History Family History  Problem Relation Age of Onset  . Heart disease Mother   . Asthma Mother   . Diabetes Mother   . Stroke Father   . Breast cancer Paternal Grandmother 27    Social History Social History   Tobacco Use  . Smoking status: Former Smoker    Packs/day: 1.00    Years: 8.00    Pack years: 8.00    Types: Cigarettes    Quit date: 2000    Years since quitting: 22.1  . Smokeless tobacco: Never Used  . Tobacco comment: quit smoking in 2000  Vaping Use  . Vaping Use: Never used  Substance Use Topics  . Alcohol use: Yes    Alcohol/week: 0.0 standard drinks    Comment: occasional  . Drug use: No     Allergies   Codeine   Review of Systems Review of Systems   Physical Exam Triage Vital Signs ED Triage Vitals  Enc Vitals Group     BP 04/02/20 1443 (!) 161/69     Pulse Rate 04/02/20 1443 81     Resp 04/02/20 1443 20     Temp 04/02/20 1443 (!) 97.5 F (36.4 C)     Temp Source 04/02/20 1443 Oral     SpO2 04/02/20 1443 97 %     Weight 04/02/20 1441 (!) 325 lb (147.4 kg)     Height 04/02/20 1441 5' 7.5" (1.715 m)     Head Circumference --      Peak Flow --      Pain Score 04/02/20 1441 0     Pain Loc --      Pain Edu? --      Excl. in GC? --    No data found.  Updated Vital Signs BP (!) 161/69 (BP Location: Right Arm)   Pulse 81   Temp (!) 97.5 F (36.4 C) (Oral)   Resp 20   Ht 5' 7.5" (1.715 m)   Wt (!) 325 lb (147.4 kg)   LMP 07/18/2000 (Approximate)   SpO2 97%   BMI 50.15 kg/m   Visual Acuity Right Eye Distance:   Left Eye Distance:   Bilateral Distance:    Right Eye Near:   Left Eye Near:    Bilateral Near:     Physical Exam Constitutional:      General: She is not in acute distress.    Appearance: She is well-developed.  Cardiovascular:     Rate and Rhythm: Normal rate.  Pulmonary:     Effort: Pulmonary effort is normal.     Breath sounds: Examination of the right-upper field reveals  wheezing. Examination of the left-upper field reveals wheezing. Examination of the left-lower field reveals wheezing. Wheezing present.  Skin:    General: Skin is warm and dry.  Neurological:     Mental Status: She is alert and oriented to person, place, and time.  UC Treatments / Results  Labs (all labs ordered are listed, but only abnormal results are displayed) Labs Reviewed - No data to display  EKG   Radiology No results found.  Procedures Procedures (including critical care time)  Medications Ordered in UC Medications - No data to display  Initial Impression / Assessment and Plan / UC Course  I have reviewed the triage vital signs and the nursing notes.  Pertinent labs & imaging results that were available during my care of the patient were reviewed by me and considered in my medical decision making (see chart for details).     Wheezing on auscultation. No work of breathing. Vitals stable. Prednisone provided at this time. nebulizers for prn use. Follow up with pulmonology for recheck and management. No acute findings on ct from 03/28/20. Patient verbalized understanding and agreeable to plan.    Final Clinical Impressions(s) / UC Diagnoses   Final diagnoses:  Mild intermittent asthma with exacerbation     Discharge Instructions     Continue with breo as previously prescribed.  5 days of prednisone.  Use of albuterol as needed for wheezing or shortness of breath .  Continue to follow with your pulmonology team for recheck and review of your CT.  Return to be seen for any worsening.    ED Prescriptions    Medication Sig Dispense Auth. Provider   albuterol (PROVENTIL) (2.5 MG/3ML) 0.083% nebulizer solution Take 3 mLs (2.5 mg total) by nebulization every 6 (six) hours as needed for wheezing or shortness of breath. 75 mL Linus Mako B, NP   predniSONE (DELTASONE) 20 MG tablet Take 2 tablets (40 mg total) by mouth daily with breakfast for 5 days. 10 tablet  Georgetta Haber, NP     PDMP not reviewed this encounter.   Georgetta Haber, NP 04/02/20 1500

## 2020-04-02 NOTE — ED Triage Notes (Signed)
Patient states that she went to see her pulmonary doctor and took her off her Breo inhaler. States that she had a CT scan last week. Patient states that she started taking her Breo inhaler. States that she was not given the CT results by her doctor but has visualized them in Epic. Reports that she is feeling more short of breath and thinks it was do to being off inhalers, concerned she may need something further to improve her symptoms.

## 2020-04-02 NOTE — Discharge Instructions (Signed)
Continue with breo as previously prescribed.  5 days of prednisone.  Use of albuterol as needed for wheezing or shortness of breath .  Continue to follow with your pulmonology team for recheck and review of your CT.  Return to be seen for any worsening.

## 2020-04-03 ENCOUNTER — Other Ambulatory Visit: Payer: Self-pay

## 2020-04-03 DIAGNOSIS — R911 Solitary pulmonary nodule: Secondary | ICD-10-CM

## 2020-05-01 ENCOUNTER — Ambulatory Visit: Payer: Self-pay | Admitting: Pulmonary Disease

## 2020-06-18 DIAGNOSIS — U071 COVID-19: Secondary | ICD-10-CM | POA: Insufficient documentation

## 2020-06-29 ENCOUNTER — Telehealth: Payer: Self-pay | Admitting: Internal Medicine

## 2020-06-29 DIAGNOSIS — U071 COVID-19: Secondary | ICD-10-CM

## 2020-06-29 NOTE — Telephone Encounter (Signed)
Discuss with Dr. Belia Heman via epic secure chat--okay to place referral to to covid treatment team. Referral has been placed. Patient is aware and voiced her understanding.  Nothing further needed.

## 2020-06-29 NOTE — Telephone Encounter (Signed)
Spoke to patient, who stated that she tested positive for covid on 06/28/2020 with home test. C/o sore throat, headache, chills, sweats and nasal/head congestion. Sx developed on 06/27/2020 Sob is baseline. She has had two covid vaccines. She has not had covid booster or flu shot.  She is using Breo daily and Tylenol PRN for headache. She has not had to use ventolin.  Hx of covid 11/2019. She is questioning if she needs infusion treatment.   Dr. Belia Heman, please advise. Thanks.

## 2020-06-30 ENCOUNTER — Telehealth: Payer: Self-pay | Admitting: Nurse Practitioner

## 2020-06-30 ENCOUNTER — Telehealth: Payer: Self-pay

## 2020-06-30 ENCOUNTER — Encounter: Payer: Self-pay | Admitting: Nurse Practitioner

## 2020-06-30 DIAGNOSIS — E119 Type 2 diabetes mellitus without complications: Secondary | ICD-10-CM | POA: Insufficient documentation

## 2020-06-30 DIAGNOSIS — E669 Obesity, unspecified: Secondary | ICD-10-CM | POA: Insufficient documentation

## 2020-06-30 NOTE — Telephone Encounter (Signed)
Called to discuss with patient about COVID-19 symptoms and the use of one of the available treatments for those with mild to moderate Covid symptoms and at a high risk of hospitalization.  Pt appears to qualify for outpatient treatment due to co-morbid conditions and/or a member of an at-risk group in accordance with the FDA Emergency Use Authorization.    Symptom onset: 06/27/20 Sore throat,chills,headache,congestion Vaccinated: Yes Booster? No Immunocompromised? No Qualifiers: Asthma,HTN,Obesity NIH Criteria: Tier 1   Second COVID 19 infection. Would like to speak with APP.  Esther Hardy

## 2020-06-30 NOTE — Telephone Encounter (Signed)
I called Kimberly Vasquez to discuss Covid symptoms and the use of or antiviral agents or a monoclonal antibody infusion for those with mild to moderate Covid symptoms and at a high risk of hospitalization.     Pt does not qualify for oral antiviral therapy due to lack of lab work in the past 90 days.  She does qualify for mAb, however there is no availability of mAb today, and the next available day is 07/03/20 (day 7 of symptoms for her).  She does not have insurance however, and does not wish to pay out of pocket for mAb.  Symptoms tier reviewed as well as criteria for ending isolation. Preventative practices reviewed. Patient verbalized understanding  Patient Active Problem List   Diagnosis Date Noted  . Diabetes mellitus without complication (HCC)   . Obesity   . COVID-19 virus infection 06/2020  . OSA (obstructive sleep apnea) 03/17/2020  . Shortness of breath 03/17/2020  . History of COVID-19 03/17/2020  . Asthma without status asthmaticus 11/16/2015  . IGT (impaired glucose tolerance) 11/16/2015  . Gastroesophageal reflux disease 11/14/2015  . Snoring 11/14/2015  . URI (upper respiratory infection) 04/19/2015  . Morbid obesity (HCC) 08/15/2014  . Extrinsic asthma 06/14/2014    Nicolasa Ducking, NP

## 2020-07-01 ENCOUNTER — Encounter: Payer: Self-pay | Admitting: Emergency Medicine

## 2020-07-01 ENCOUNTER — Ambulatory Visit (INDEPENDENT_AMBULATORY_CARE_PROVIDER_SITE_OTHER): Payer: Self-pay

## 2020-07-01 ENCOUNTER — Ambulatory Visit
Admission: EM | Admit: 2020-07-01 | Discharge: 2020-07-01 | Disposition: A | Discharge status: Home / Self Care | Payer: Self-pay | Attending: Physician Assistant | Admitting: Physician Assistant

## 2020-07-01 ENCOUNTER — Other Ambulatory Visit: Payer: Self-pay

## 2020-07-01 DIAGNOSIS — U071 COVID-19: Secondary | ICD-10-CM

## 2020-07-01 DIAGNOSIS — R062 Wheezing: Secondary | ICD-10-CM

## 2020-07-01 DIAGNOSIS — R059 Cough, unspecified: Secondary | ICD-10-CM

## 2020-07-01 DIAGNOSIS — J45901 Unspecified asthma with (acute) exacerbation: Secondary | ICD-10-CM

## 2020-07-01 MED ORDER — PREDNISONE 50 MG PO TABS: 50.0000 mg | ORAL_TABLET | Freq: Every day | ORAL | 0 refills | Status: AC

## 2020-07-01 NOTE — ED Triage Notes (Signed)
Patient states that she tested positive for COVID on Wed.  Patient has history of asthma.  Patient has had cough since Wed.  Patient states she started wheezing this morning. Patient reports low grade fevers.

## 2020-07-01 NOTE — ED Provider Notes (Signed)
[facility]    CSN: [id number] Arrival date & time: 07/01/20  1403      History   Chief Complaint Chief Complaint  Patient presents with  . Cough    COVID+    HPI Kimberly BallRobin Kipp Broodaylor Vasquez is a 60 y.o. female presenting for 3 to 4-day history of cough, congestion, fatigue.  Patient says that over the past day she has started to have some wheezing.  She does have history of asthma/allergies.  Patient did test positive for COVID-19 on Wednesday.  She has a personal history of COVID-19 in October 2021.  Vaccinated for COVID-19 x2.  Patient uses Breo daily for her asthma and takes allergy medication.  She also uses albuterol nebulizer.  States that she has been using that and those do help.  She says that she gets concerned whenever she has wheezing related to any cough because she wants to make sure she does not have any pneumonia or other serious infection.  Patient says that she is self-pay and does not have the money to pay for COVID antibody therapy at this time.  Patient says she is most interested in ruling out pneumonia and perhaps starting prednisone.  No other concerns today.  HPI  Past Medical History:  Diagnosis Date  . Allergic rhinitis   . Asthma without status asthmaticus   . COVID-19 virus infection    a. 11/2019; b. 06/2020.  . Diabetes mellitus without complication (HCC) [id number],1991   gestational  . GERD (gastroesophageal reflux disease)   . Hyperlipidemia   . IGT (impaired glucose tolerance)   . Morbid obesity (HCC)   . Obesity   . Osteopenia   . Sleep apnea   . Vertigo   . Vitamin D deficiency     Patient Active Problem List   Diagnosis Date Noted  . Diabetes mellitus without complication (HCC)   . Obesity   . COVID-19 virus infection 06/2020  . OSA (obstructive sleep apnea) 03/17/2020  . Shortness of breath 03/17/2020  . History of COVID-19 03/17/2020  . Asthma without status asthmaticus 11/16/2015  . IGT (impaired glucose tolerance) 11/16/2015  .  Gastroesophageal reflux disease 11/14/2015  . Snoring 11/14/2015  . URI (upper respiratory infection) 04/19/2015  . Morbid obesity (HCC) 08/15/2014  . Extrinsic asthma 06/14/2014    Past Surgical History:  Procedure Laterality Date  . ABDOMINAL HYSTERECTOMY  2001  . BREAST BIOPSY Right 01/06/2020   stereo bx, ribbon clip, ATYPICAL GLANDULAR PROLIFERATION  . BREAST BIOPSY WITH RADIO FREQUENCY LOCALIZER Right 02/02/2020   Procedure: BREAST BIOPSY WITH RADIO FREQUENCY LOCALIZER;  Surgeon: Carolan Shiverintron-Diaz, Edgardo, MD;  Location: [facility];  Service: General;  Laterality: Right;  . BREAST CYST ASPIRATION Left   . BREAST EXCISIONAL BIOPSY Right 1993   benign, unable to see scar   . BREAST EXCISIONAL BIOPSY Left 1979   benign  . CHOLECYSTECTOMY    . ESOPHAGOGASTRODUODENOSCOPY (EGD) WITH PROPOFOL N/A 01/02/2016   Procedure: ESOPHAGOGASTRODUODENOSCOPY (EGD) WITH PROPOFOL;  Surgeon: Midge Miniumarren Wohl, MD;  Location: [facility];  Service: Endoscopy;  Laterality: N/A;    OB History   No obstetric history on file.      Home Medications    Prior to Admission medications   Medication Sig Start Date End Date Taking? Authorizing Provider  albuterol (PROVENTIL) (2.5 MG/3ML) 0.083% nebulizer solution Take 3 mLs (2.5 mg total) by nebulization every 6 (six) hours as needed for wheezing or shortness of breath. 04/02/20  Yes Georgetta HaberBurky, Natalie B, NP  albuterol (VENTOLIN  HFA) 108 (90 Base) MCG/ACT inhaler Inhale 1-2 puffs into the lungs every 6 (six) hours as needed for wheezing or shortness of breath. 01/28/19  Yes Erin Fulling, MD  cetirizine (ZYRTEC) 10 MG tablet Take 10 mg by mouth daily.   Yes [provider]  diphenhydramine-acetaminophen (TYLENOL PM) 25-500 MG TABS tablet Take 1 tablet by mouth at bedtime.   Yes [provider]  fluticasone furoate-vilanterol (BREO ELLIPTA) 100-25 MCG/INH AEPB Inhale 1 puff into the lungs daily. 03/17/20  Yes Erin Fulling, MD  omeprazole (PRILOSEC) 20 MG  capsule Take 20 mg by mouth every evening.    Yes [provider]  predniSONE (DELTASONE) 50 MG tablet Take 1 tablet (50 mg total) by mouth daily for 5 days. 07/01/20 07/06/20 Yes Eusebio Friendly B, PA-C  triamcinolone (NASACORT) 55 MCG/ACT AERO nasal inhaler Place 2 sprays into the nose daily.   Yes [provider]    Family History Family History  Problem Relation Age of Onset  . Heart disease Mother   . Asthma Mother   . Diabetes Mother   . Stroke Father   . Breast cancer Paternal Grandmother 20    Social History Social History   Tobacco Use  . Smoking status: Former Smoker    Packs/day: 1.00    Years: 8.00    Pack years: 8.00    Types: Cigarettes    Quit date: 2000    Years since quitting: 22.3  . Smokeless tobacco: Never Used  . Tobacco comment: quit smoking in 2000  Vaping Use  . Vaping Use: Never used  Substance Use Topics  . Alcohol use: Yes    Alcohol/week: 0.0 standard drinks    Comment: occasional  . Drug use: No     Allergies   Codeine   Review of Systems Review of Systems  Constitutional: Negative for chills, diaphoresis, fatigue and fever.  HENT: Positive for congestion and rhinorrhea. Negative for ear pain, sinus pressure, sinus pain and sore throat.   Respiratory: Positive for cough, shortness of breath and wheezing.   Cardiovascular: Negative for chest pain.  Gastrointestinal: Negative for abdominal pain, nausea and vomiting.  Musculoskeletal: Negative for arthralgias and myalgias.  Skin: Negative for rash.  Neurological: Negative for weakness and headaches.  Hematological: Negative for adenopathy.     Physical Exam Triage Vital Signs ED Triage Vitals  Enc Vitals Group     BP 07/01/20 1424 125/70     Pulse Rate 07/01/20 1424 100     Resp 07/01/20 1424 16     Temp 07/01/20 1424 98.6 F (37 C)     Temp Source 07/01/20 1424 Oral     SpO2 07/01/20 1424 96 %     Weight 07/01/20 1423 (!) 325 lb (147.4 kg)     Height 07/01/20  1423 5' 7.5" (1.715 m)     Head Circumference --      Peak Flow --      Pain Score 07/01/20 1422 0     Pain Loc --      Pain Edu? --      Excl. in GC? --    No data found.  Updated Vital Signs BP 125/70 (BP Location: Left Arm)   Pulse 100   Temp 98.6 F (37 C) (Oral)   Resp 16   Ht 5' 7.5" (1.715 m)   Wt (!) 325 lb (147.4 kg)   LMP 07/18/2000 (Approximate)   SpO2 96%   BMI 50.15 kg/m    Physical  Exam Vitals and nursing note reviewed.  Constitutional:      General: She is not in acute distress.    Appearance: Normal appearance. She is obese. She is not ill-appearing or toxic-appearing.  HENT:     Head: Normocephalic and atraumatic.     Nose: Nose normal.     Mouth/Throat:     Mouth: Mucous membranes are moist.     Pharynx: Oropharynx is clear.  Eyes:     General: No scleral icterus.       Right eye: No discharge.        Left eye: No discharge.     Conjunctiva/sclera: Conjunctivae normal.  Cardiovascular:     Rate and Rhythm: Normal rate and regular rhythm.     Heart sounds: Normal heart sounds.  Pulmonary:     Effort: Pulmonary effort is normal. No respiratory distress.     Breath sounds: Wheezing (few scattered wheezes throughout) present.  Musculoskeletal:     Cervical back: Neck supple.  Skin:    General: Skin is dry.  Neurological:     General: No focal deficit present.     Mental Status: She is alert. Mental status is at baseline.     Motor: No weakness.     Gait: Gait normal.  Psychiatric:        Mood and Affect: Mood normal.        Behavior: Behavior normal.        Thought Content: Thought content normal.      UC Treatments / Results  Labs (all labs ordered are listed, but only abnormal results are displayed) Labs Reviewed - No data to display  EKG   Radiology DG Chest 2 View  Result Date: 07/01/2020 CLINICAL DATA:  Cough and wheezing; COVID-19 positive EXAM: CHEST - 2 VIEW COMPARISON:  March 17, 2020 chest radiograph; chest CT  March 28, 2020 FINDINGS: No appreciable edema or airspace opacity. The heart size and pulmonary vascularity are normal. No adenopathy. There is mild degenerative change in the thoracic spine. IMPRESSION: No edema or airspace opacity.  Heart size within normal limits. Electronically Signed   By: Bretta Bang III M.D.   On: 07/01/2020 15:20    Procedures Procedures (including critical care time)  Medications Ordered in UC Medications - No data to display  Initial Impression / Assessment and Plan / UC Course  I have reviewed the triage vital signs and the nursing notes.  Pertinent labs & imaging results that were available during my care of the patient were reviewed by me and considered in my medical decision making (see chart for details).   60 year old female who is COVID-positive x3 to 4 days presenting for COVID symptoms.  Patient does have history of asthma and concerned since she has started to have some wheezing.  Also admits to some shortness of breath.  All her vital signs are normal and stable today.  Her oxygen is 96%.  She is overall well-appearing and in no respiratory distress.  Her exam is significant for few scattered wheezes throughout.  Chest x-ray obtained today and independently reviewed by me is within normal limits.  No acute abnormality seen.  No evidence of infiltrates.  Reviewed this with patient.  Is self-pay and unable to afford antiviral medication or the antibody infusion.  She requests prednisone which I have gladly given her given her symptoms and the fact that she is COVID-positive.  Advised her to continue with her asthma inhaler and breathing treatments.  I did  thoroughly review ED precautions for COVID-19 and asthma exacerbation with patient.   Final Clinical Impressions(s) / UC Diagnoses   Final diagnoses:  COVID-19  Cough  Exacerbation of asthma, unspecified asthma severity, unspecified whether persistent     Discharge Instructions     Your chest  x-ray is normal today.  There is no evidence of pneumonia which is reassuring.  You are having an exacerbation of your asthma but I am glad you are feeling better than you were at the onset.  Continue with your at home inhalers.  I sent prednisone to the pharmacy.  Please see more information regarding COVID below.  She go to the emergency department if you develop any acute worsening of your breathing.  COVID-19 INFECTION: The incubation period of COVID-19 is approximately 14 days after exposure, with most symptoms developing in roughly 4-5 days. Symptoms may range in severity from mild to critically severe. Roughly 80% of those infected will have mild symptoms. People of any age may become infected with COVID-19 and have the ability to transmit the virus. The most common symptoms include: fever, fatigue, cough, body aches, headaches, sore throat, nasal congestion, shortness of breath, nausea, vomiting, diarrhea, changes in smell and/or taste.    COURSE OF ILLNESS Some patients may begin with mild disease which can progress quickly into critical symptoms. If your symptoms are worsening please call ahead to the Emergency Department and proceed there for further treatment. Recovery time appears to be roughly 1-2 weeks for mild symptoms and 3-6 weeks for severe disease.   GO IMMEDIATELY TO ER FOR FEVER YOU ARE UNABLE TO GET DOWN WITH TYLENOL, BREATHING PROBLEMS, CHEST PAIN, FATIGUE, LETHARGY, INABILITY TO EAT OR DRINK, ETC  QUARANTINE AND ISOLATION: To help decrease the spread of COVID-19 please remain isolated if you have COVID infection or are highly suspected to have COVID infection. This means -stay home and isolate to one room in the home if you live with others. Do not share a bed or bathroom with others while ill, sanitize and wipe down all countertops and keep common areas clean and disinfected. Stay home for 5 days. If you have no symptoms or your symptoms are resolving after 5 days, you can leave  your house. Continue to wear a mask around others for 5 additional days. If you have been in close contact (within 6 feet) of someone diagnosed with COVID 19, you are advised to quarantine in your home for 14 days as symptoms can develop anywhere from 2-14 days after exposure to the virus. If you develop symptoms, you  must isolate.  Most current guidelines for COVID after exposure -unvaccinated: isolate 5 days and strict mask use x 5 days. Test on day 5 is possible -vaccinated: wear mask x 10 days if symptoms do not develop -You do not necessarily need to be tested for COVID if you have + exposure and  develop symptoms. Just isolate at home x10 days from symptom onset During this global pandemic, CDC advises to practice social distancing, try to stay at least 73ft away from others at all times. Wear a face covering. Wash and sanitize your hands regularly and avoid going anywhere that is not necessary.  KEEP IN MIND THAT THE COVID TEST IS NOT 100% ACCURATE AND YOU SHOULD STILL DO EVERYTHING TO PREVENT POTENTIAL SPREAD OF VIRUS TO OTHERS (WEAR MASK, WEAR GLOVES, WASH HANDS AND SANITIZE REGULARLY). IF INITIAL TEST IS NEGATIVE, THIS MAY NOT MEAN YOU ARE DEFINITELY NEGATIVE. MOST ACCURATE TESTING IS DONE  5-7 DAYS AFTER EXPOSURE.   It is not advised by CDC to get re-tested after receiving a positive COVID test since you can still test positive for weeks to months after you have already cleared the virus.   *If you have not been vaccinated for COVID, I strongly suggest you consider getting vaccinated as long as there are no contraindications.      ED Prescriptions    Medication Sig Dispense Auth. Provider   predniSONE (DELTASONE) 50 MG tablet Take 1 tablet (50 mg total) by mouth daily for 5 days. 5 tablet Gareth Morgan     PDMP not reviewed this encounter.   Shirlee Latch, PA-C 07/01/20 1540

## 2020-07-01 NOTE — Discharge Instructions (Signed)
Your chest x-ray is normal today.  There is no evidence of pneumonia which is reassuring.  You are having an exacerbation of your asthma but I am glad you are feeling better than you were at the onset.  Continue with your at home inhalers.  I sent prednisone to the pharmacy.  Please see more information regarding COVID below.  She go to the emergency department if you develop any acute worsening of your breathing.  COVID-19 INFECTION: The incubation period of COVID-19 is approximately 14 days after exposure, with most symptoms developing in roughly 4-5 days. Symptoms may range in severity from mild to critically severe. Roughly 80% of those infected will have mild symptoms. People of any age may become infected with COVID-19 and have the ability to transmit the virus. The most common symptoms include: fever, fatigue, cough, body aches, headaches, sore throat, nasal congestion, shortness of breath, nausea, vomiting, diarrhea, changes in smell and/or taste.    COURSE OF ILLNESS Some patients may begin with mild disease which can progress quickly into critical symptoms. If your symptoms are worsening please call ahead to the Emergency Department and proceed there for further treatment. Recovery time appears to be roughly 1-2 weeks for mild symptoms and 3-6 weeks for severe disease.   GO IMMEDIATELY TO ER FOR FEVER YOU ARE UNABLE TO GET DOWN WITH TYLENOL, BREATHING PROBLEMS, CHEST PAIN, FATIGUE, LETHARGY, INABILITY TO EAT OR DRINK, ETC  QUARANTINE AND ISOLATION: To help decrease the spread of COVID-19 please remain isolated if you have COVID infection or are highly suspected to have COVID infection. This means -stay home and isolate to one room in the home if you live with others. Do not share a bed or bathroom with others while ill, sanitize and wipe down all countertops and keep common areas clean and disinfected. Stay home for 5 days. If you have no symptoms or your symptoms are resolving after 5 days, you  can leave your house. Continue to wear a mask around others for 5 additional days. If you have been in close contact (within 6 feet) of someone diagnosed with COVID 19, you are advised to quarantine in your home for 14 days as symptoms can develop anywhere from 2-14 days after exposure to the virus. If you develop symptoms, you  must isolate.  Most current guidelines for COVID after exposure -unvaccinated: isolate 5 days and strict mask use x 5 days. Test on day 5 is possible -vaccinated: wear mask x 10 days if symptoms do not develop -You do not necessarily need to be tested for COVID if you have + exposure and  develop symptoms. Just isolate at home x10 days from symptom onset During this global pandemic, CDC advises to practice social distancing, try to stay at least 22ft away from others at all times. Wear a face covering. Wash and sanitize your hands regularly and avoid going anywhere that is not necessary.  KEEP IN MIND THAT THE COVID TEST IS NOT 100% ACCURATE AND YOU SHOULD STILL DO EVERYTHING TO PREVENT POTENTIAL SPREAD OF VIRUS TO OTHERS (WEAR MASK, WEAR GLOVES, WASH HANDS AND SANITIZE REGULARLY). IF INITIAL TEST IS NEGATIVE, THIS MAY NOT MEAN YOU ARE DEFINITELY NEGATIVE. MOST ACCURATE TESTING IS DONE 5-7 DAYS AFTER EXPOSURE.   It is not advised by CDC to get re-tested after receiving a positive COVID test since you can still test positive for weeks to months after you have already cleared the virus.   *If you have not been vaccinated for COVID,  I strongly suggest you consider getting vaccinated as long as there are no contraindications.

## 2020-07-11 ENCOUNTER — Ambulatory Visit
Admission: RE | Admit: 2020-07-11 | Discharge: 2020-07-11 | Disposition: A | Discharge status: Home / Self Care | Payer: Self-pay | Source: Ambulatory Visit | Attending: Family Medicine | Admitting: Family Medicine

## 2020-07-11 ENCOUNTER — Other Ambulatory Visit: Payer: Self-pay

## 2020-07-11 ENCOUNTER — Ambulatory Visit (INDEPENDENT_AMBULATORY_CARE_PROVIDER_SITE_OTHER): Payer: Self-pay

## 2020-07-11 VITALS — BP 146/75 | HR 78 | Temp 98.8°F | Resp 16 | Wt 325.0 lb

## 2020-07-11 DIAGNOSIS — M1711 Unilateral primary osteoarthritis, right knee: Secondary | ICD-10-CM

## 2020-07-11 DIAGNOSIS — M25561 Pain in right knee: Secondary | ICD-10-CM

## 2020-07-11 MED ORDER — MELOXICAM 15 MG PO TABS: 15.0000 mg | ORAL_TABLET | Freq: Every day | ORAL | 0 refills | Status: DC | PRN

## 2020-07-11 NOTE — ED Provider Notes (Signed)
[facility]    CSN: [id number] Arrival date & time: 07/11/20  0948      History   Chief Complaint Chief Complaint  Patient presents with  . Knee Pain   HPI   60 year old female presents with right knee pain.  Patient reports that she injured his knee previously.  It has subsequently gotten back to normal.  However, on Saturday she got out of bed and felt like her knee was going to give way.  She denies any fall, trauma, injury.  She reports moderate pain, 7/10 in severity.  Described as achy.  No relieving factors.  No other reported symptoms.  No other complaints.  Past Medical History:  Diagnosis Date  . Allergic rhinitis   . Asthma without status asthmaticus   . COVID-19 virus infection    a. 11/2019; b. 06/2020.  . Diabetes mellitus without complication (HCC) 4888,9169   gestational  . GERD (gastroesophageal reflux disease)   . Hyperlipidemia   . IGT (impaired glucose tolerance)   . Morbid obesity (HCC)   . Obesity   . Osteopenia   . Sleep apnea   . Vertigo   . Vitamin D deficiency     Patient Active Problem List   Diagnosis Date Noted  . Diabetes mellitus without complication (HCC)   . Obesity   . COVID-19 virus infection 06/2020  . OSA (obstructive sleep apnea) 03/17/2020  . Shortness of breath 03/17/2020  . History of COVID-19 03/17/2020  . Asthma without status asthmaticus 11/16/2015  . IGT (impaired glucose tolerance) 11/16/2015  . Gastroesophageal reflux disease 11/14/2015  . Snoring 11/14/2015  . URI (upper respiratory infection) 04/19/2015  . Morbid obesity (HCC) 08/15/2014  . Extrinsic asthma 06/14/2014    Past Surgical History:  Procedure Laterality Date  . ABDOMINAL HYSTERECTOMY  2001  . BREAST BIOPSY Right 01/06/2020   stereo bx, ribbon clip, ATYPICAL GLANDULAR PROLIFERATION  . BREAST BIOPSY WITH RADIO FREQUENCY LOCALIZER Right 02/02/2020   Procedure: BREAST BIOPSY WITH RADIO FREQUENCY LOCALIZER;  Surgeon: Carolan Shiver, MD;  Location: [facility] ORS;  Service: General;  Laterality: Right;  . BREAST CYST ASPIRATION Left   . BREAST EXCISIONAL BIOPSY Right 1993   benign, unable to see scar   . BREAST EXCISIONAL BIOPSY Left 1979   benign  . CHOLECYSTECTOMY    . ESOPHAGOGASTRODUODENOSCOPY (EGD) WITH PROPOFOL N/A 01/02/2016   Procedure: ESOPHAGOGASTRODUODENOSCOPY (EGD) WITH PROPOFOL;  Surgeon: Midge Minium, MD;  Location: [facility];  Service: Endoscopy;  Laterality: N/A;    OB History   No obstetric history on file.      Home Medications    Prior to Admission medications   Medication Sig Start Date End Date Taking? Authorizing Provider  albuterol (PROVENTIL) (2.5 MG/3ML) 0.083% nebulizer solution Take 3 mLs (2.5 mg total) by nebulization every 6 (six) hours as needed for wheezing or shortness of breath. 04/02/20  Yes Burky, Barron Alvine, NP  albuterol (VENTOLIN HFA) 108 (90 Base) MCG/ACT inhaler Inhale 1-2 puffs into the lungs every 6 (six) hours as needed for wheezing or shortness of breath. 01/28/19  Yes Kasa, Wallis Bamberg, MD  diphenhydramine-acetaminophen (TYLENOL PM) 25-500 MG TABS tablet Take 1 tablet by mouth at bedtime.   Yes [provider]  fluticasone furoate-vilanterol (BREO ELLIPTA) 100-25 MCG/INH AEPB Inhale 1 puff into the lungs daily. 03/17/20  Yes Erin Fulling, MD  meloxicam (MOBIC) 15 MG tablet Take 1 tablet (15 mg total) by mouth daily as needed for pain. 07/11/20  Yes Tommie Sams,  DO  omeprazole (PRILOSEC) 20 MG capsule Take 20 mg by mouth every evening.    Yes [provider]  triamcinolone (NASACORT) 55 MCG/ACT AERO nasal inhaler Place 2 sprays into the nose daily.   Yes [provider]  cetirizine (ZYRTEC) 10 MG tablet Take 10 mg by mouth daily.    [provider]    Family History Family History  Problem Relation Age of Onset  . Heart disease Mother   . Asthma Mother   . Diabetes Mother   . Stroke Father   . Breast cancer Paternal Grandmother  81    Social History Social History   Tobacco Use  . Smoking status: Former Smoker    Packs/day: 1.00    Years: 8.00    Pack years: 8.00    Types: Cigarettes    Quit date: 2000    Years since quitting: 22.4  . Smokeless tobacco: Never Used  . Tobacco comment: quit smoking in 2000  Vaping Use  . Vaping Use: Never used  Substance Use Topics  . Alcohol use: Yes    Alcohol/week: 0.0 standard drinks    Comment: occasional  . Drug use: No     Allergies   Codeine   Review of Systems Review of Systems  Constitutional: Negative.   Musculoskeletal:       Right knee pain, instability.   Physical Exam Triage Vital Signs ED Triage Vitals  Enc Vitals Group     BP 07/11/20 1013 (!) 146/75     Pulse Rate 07/11/20 1013 78     Resp 07/11/20 1013 16     Temp 07/11/20 1013 98.8 F (37.1 C)     Temp Source 07/11/20 1013 Oral     SpO2 07/11/20 1013 98 %     Weight 07/11/20 1011 (!) 325 lb (147.4 kg)     Height --      Head Circumference --      Peak Flow --      Pain Score 07/11/20 1011 7     Pain Loc --      Pain Edu? --      Excl. in GC? --    Updated Vital Signs BP (!) 146/75 (BP Location: Left Arm)   Pulse 78   Temp 98.8 F (37.1 C) (Oral)   Resp 16   Wt (!) 147.4 kg   LMP 07/18/2000 (Approximate)   SpO2 98%   BMI 50.15 kg/m   Visual Acuity Right Eye Distance:   Left Eye Distance:   Bilateral Distance:    Right Eye Near:   Left Eye Near:    Bilateral Near:     Physical Exam Constitutional:      General: She is not in acute distress.    Appearance: Normal appearance. She is obese. She is not ill-appearing.  HENT:     Head: Normocephalic and atraumatic.  Pulmonary:     Effort: Pulmonary effort is normal. No respiratory distress.  Musculoskeletal:     Comments: Right knee -exam limited due to body habitus.  No discrete areas of tenderness.  No appreciable effusion.  Ligaments intact  Neurological:     Mental Status: She is alert.  Psychiatric:         Mood and Affect: Mood normal.        Behavior: Behavior normal.    UC Treatments / Results  Labs (all labs ordered are listed, but only abnormal results are displayed) Labs Reviewed - No data to display  EKG   Radiology DG Knee Complete 4 Views Right  Result Date: 07/11/2020 CLINICAL DATA:  Right knee pain EXAM: RIGHT KNEE - COMPLETE 4+ VIEW COMPARISON:  None. FINDINGS: No evidence of fracture, dislocation, or joint effusion. Mild medial compartment osteoarthritis as manifested by joint space narrowing and marginal osteophyte formation. 5 mm probable loose body at the posterior joint line. Soft tissues are unremarkable. IMPRESSION: 1. No acute osseous abnormality, right knee. 2. Mild medial compartment osteoarthritis with 5 mm probable loose body at the posterior joint line. Electronically Signed   By: Duanne Guess D.O.   On: 07/11/2020 11:19    Procedures Procedures (including critical care time)  Medications Ordered in UC Medications - No data to display  Initial Impression / Assessment and Plan / UC Course  I have reviewed the triage vital signs and the nursing notes.  Pertinent labs & imaging results that were available during my care of the patient were reviewed by me and considered in my medical decision making (see chart for details).    60 year old female presents with acute right knee pain.  X-ray was obtained and was independent reviewed by me.  Independent interpretation: Mild arthritis medially.  There is a loose body noted.  Advise rest, ice.  Meloxicam as directed.  Advised to follow-up with orthopedics.  Will likely need MRI imaging.  Final Clinical Impressions(s) / UC Diagnoses   Final diagnoses:  Acute pain of right knee  Primary osteoarthritis of right knee     Discharge Instructions     Rest, ice.  Medication as prescribed.  Please call [facility] clinic Orthopedics [phone]) OR [facility] [phone]) for an appt.    ED Prescriptions     Medication Sig Dispense Auth. Provider   meloxicam (MOBIC) 15 MG tablet Take 1 tablet (15 mg total) by mouth daily as needed for pain. 30 tablet Tommie Sams, DO     PDMP not reviewed this encounter.   Tommie Sams, [place] 07/11/20 1237

## 2020-07-11 NOTE — Discharge Instructions (Signed)
Rest, ice.  Medication as prescribed.  Please call [facility] clinic Orthopedics [phone]) OR [facility] [phone]) for an appt.

## 2020-07-11 NOTE — ED Triage Notes (Signed)
Pt c/o right knee pain that occurred Saturday when getting out of bed.

## 2020-09-14 ENCOUNTER — Other Ambulatory Visit: Payer: Self-pay

## 2020-09-14 ENCOUNTER — Encounter: Payer: Self-pay | Admitting: Emergency Medicine

## 2020-09-14 ENCOUNTER — Ambulatory Visit
Admission: EM | Admit: 2020-09-14 | Discharge: 2020-09-14 | Disposition: A | Discharge status: Home / Self Care | Payer: Self-pay | Attending: Sports Medicine | Admitting: Sports Medicine

## 2020-09-14 DIAGNOSIS — J40 Bronchitis, not specified as acute or chronic: Secondary | ICD-10-CM

## 2020-09-14 DIAGNOSIS — R0989 Other specified symptoms and signs involving the circulatory and respiratory systems: Secondary | ICD-10-CM

## 2020-09-14 DIAGNOSIS — R062 Wheezing: Secondary | ICD-10-CM

## 2020-09-14 DIAGNOSIS — Z8709 Personal history of other diseases of the respiratory system: Secondary | ICD-10-CM

## 2020-09-14 DIAGNOSIS — R059 Cough, unspecified: Secondary | ICD-10-CM

## 2020-09-14 DIAGNOSIS — J069 Acute upper respiratory infection, unspecified: Secondary | ICD-10-CM

## 2020-09-14 MED ORDER — PROMETHAZINE-DM 6.25-15 MG/5ML PO SYRP: 5.0000 mL | ORAL_SOLUTION | Freq: Four times a day (QID) | ORAL | 0 refills | Status: DC | PRN

## 2020-09-14 MED ORDER — PREDNISONE 10 MG PO TABS: 20.0000 mg | ORAL_TABLET | Freq: Every day | ORAL | 0 refills | Status: DC

## 2020-09-14 MED ORDER — AZITHROMYCIN 250 MG PO TABS: 250.0000 mg | ORAL_TABLET | Freq: Every day | ORAL | 0 refills | Status: DC

## 2020-09-14 NOTE — ED Triage Notes (Signed)
Reports cold symptoms one week ago, cough in the morning.  Started taking mucinex dm.  Cough has worsened, patient reports she is coughing all night.  Denies fever.  Patient thinks congestion is draining from sinus .

## 2020-09-14 NOTE — Discharge Instructions (Addendum)
As we discussed, you do have an expiratory wheeze on examination.  I am electing to go ahead and treat you given your history of asthma and COPD.  Clinically you develop bronchitis.  Will treat you with azithromycin and steroids.  Please continue with your inhalers and pulmonary toilet. Please follow-up with Dr. Belia Heman as scheduled. Please see educational handouts. If you have increasing symptoms then I would encourage you to call 911 or go to the ER.

## 2020-09-14 NOTE — ED Provider Notes (Signed)
[facility]    CSN: [id number] Arrival date & time: 09/14/20  1023      History   Chief Complaint Chief Complaint  Patient presents with   Cough    HPI Kimberly Vasquez is a 60 y.o. female.   60 year old female with a history of asthma and COPD presents for evaluation of 1 week of URI symptoms.  She reports cough and chest congestion with some wheezing and sneezing.  No fever shakes chills.  Has been taking Mucinex DM but is not really helping.  No nausea vomiting diarrhea.  No abdominal or urinary symptoms.  No COVID exposure.  She has been vaccinated x2.  She has had COVID twice.  She has not had her flu shot.  She does not work outside the home.  No chest pain or shortness of breath beyond her baseline.  She has no concerns for contracting COVID currently.  She does not have a primary care physician.  She sees Dr. Belia Heman who is her pulmonologist and prescribes her medicines for COPD and asthma.   Past Medical History:  Diagnosis Date   Allergic rhinitis    Asthma without status asthmaticus    COVID-19 virus infection    a. 11/2019; b. 06/2020.   Diabetes mellitus without complication (HCC) 6010,9323   gestational   GERD (gastroesophageal reflux disease)    Hyperlipidemia    IGT (impaired glucose tolerance)    Morbid obesity (HCC)    Obesity    Osteopenia    Sleep apnea    Vertigo    Vitamin D deficiency     Patient Active Problem List   Diagnosis Date Noted   Diabetes mellitus without complication (HCC)    Obesity    COVID-19 virus infection 06/2020   OSA (obstructive sleep apnea) 03/17/2020   Shortness of breath 03/17/2020   History of COVID-19 03/17/2020   Asthma without status asthmaticus 11/16/2015   IGT (impaired glucose tolerance) 11/16/2015   Gastroesophageal reflux disease 11/14/2015   Snoring 11/14/2015   URI (upper respiratory infection) 04/19/2015   Morbid obesity (HCC) 08/15/2014   Extrinsic asthma 06/14/2014    Past Surgical  History:  Procedure Laterality Date   ABDOMINAL HYSTERECTOMY  2001   BREAST BIOPSY Right 01/06/2020   stereo bx, ribbon clip, ATYPICAL GLANDULAR PROLIFERATION   BREAST BIOPSY WITH RADIO FREQUENCY LOCALIZER Right 02/02/2020   Procedure: BREAST BIOPSY WITH RADIO FREQUENCY LOCALIZER;  Surgeon: Carolan Shiver, MD;  Location: [facility] ORS;  Service: General;  Laterality: Right;   BREAST CYST ASPIRATION Left    BREAST EXCISIONAL BIOPSY Right 1993   benign, unable to see scar    BREAST EXCISIONAL BIOPSY Left 1979   benign   CHOLECYSTECTOMY     ESOPHAGOGASTRODUODENOSCOPY (EGD) WITH PROPOFOL N/A 01/02/2016   Procedure: ESOPHAGOGASTRODUODENOSCOPY (EGD) WITH PROPOFOL;  Surgeon: Midge Minium, MD;  Location: [facility] ENDOS[facility];  Service: Endoscopy;  Laterality: N/A;    OB History   No obstetric history on file.      Home Medications    Prior to Admission medications   Medication Sig Start Date End Date Taking? Authorizing Provider  azithromycin (ZITHROMAX) 250 MG tablet Take 1 tablet (250 mg total) by mouth daily. Take first 2 tablets together, then 1 every day until finished. 09/14/20  Yes Delton See, MD  cetirizine (ZYRTEC) 10 MG tablet Take 10 mg by mouth daily.   Yes [provider]  fluticasone furoate-vilanterol (BREO ELLIPTA) 100-25 MCG/INH AEPB Inhale 1 puff into the lungs daily. 03/17/20  Yes Erin Fulling, MD  omeprazole (PRILOSEC) 20 MG capsule Take 20 mg by mouth every evening.    Yes [provider]  predniSONE (DELTASONE) 10 MG tablet Take 2 tablets (20 mg total) by mouth daily. 09/14/20  Yes Delton See, MD  promethazine-dextromethorphan (PROMETHAZINE-DM) 6.25-15 MG/5ML syrup Take 5 mLs by mouth 4 (four) times daily as needed for cough. 09/14/20  Yes Delton See, MD  triamcinolone (NASACORT) 55 MCG/ACT AERO nasal inhaler Place 2 sprays into the nose daily.   Yes [provider]  albuterol (PROVENTIL) (2.5 MG/3ML) 0.083% nebulizer solution Take 3  mLs (2.5 mg total) by nebulization every 6 (six) hours as needed for wheezing or shortness of breath. 04/02/20   Georgetta Haber, NP  albuterol (VENTOLIN HFA) 108 (90 Base) MCG/ACT inhaler Inhale 1-2 puffs into the lungs every 6 (six) hours as needed for wheezing or shortness of breath. 01/28/19   Erin Fulling, MD  diphenhydramine-acetaminophen (TYLENOL PM) 25-500 MG TABS tablet Take 1 tablet by mouth at bedtime.    [provider]  meloxicam (MOBIC) 15 MG tablet Take 1 tablet (15 mg total) by mouth daily as needed for pain. Patient not taking: Reported on 09/14/2020 07/11/20   Tommie Sams, DO    Family History Family History  Problem Relation Age of Onset   Heart disease Mother    Asthma Mother    Diabetes Mother    Stroke Father    Breast cancer Paternal Grandmother 49    Social History Social History   Tobacco Use   Smoking status: Former    Packs/day: 1.00    Years: 8.00    Pack years: 8.00    Types: Cigarettes    Quit date: 2000    Years since quitting: 22.5   Smokeless tobacco: Never   Tobacco comments:    quit smoking in 2000  Vaping Use   Vaping Use: Never used  Substance Use Topics   Alcohol use: Yes    Alcohol/week: 0.0 standard drinks    Comment: occasional   Drug use: No     Allergies   Codeine   Review of Systems Review of Systems  Constitutional:  Negative for activity change, appetite change, chills, diaphoresis, fatigue and fever.  HENT:  Positive for congestion and sneezing. Negative for ear pain, postnasal drip, rhinorrhea, sinus pressure, sinus pain and sore throat.   Eyes:  Negative for pain.  Respiratory:  Positive for cough, chest tightness, shortness of breath and wheezing.   Cardiovascular:  Negative for chest pain and palpitations.  Gastrointestinal:  Negative for abdominal pain, diarrhea, nausea and vomiting.  Genitourinary:  Negative for dysuria.  Musculoskeletal:  Negative for back pain, myalgias and neck pain.  Skin:   Negative for color change, pallor, rash and wound.  Neurological:  Negative for dizziness, light-headedness and headaches.  All other systems reviewed and are negative.   Physical Exam Triage Vital Signs ED Triage Vitals  Enc Vitals Group     BP 09/14/20 1045 (!) 144/77     Pulse Rate 09/14/20 1045 80     Resp 09/14/20 1045 (!) 25     Temp 09/14/20 1045 98.6 F (37 C)     Temp Source 09/14/20 1045 Oral     SpO2 09/14/20 1045 95 %     Weight --      Height --      Head Circumference --      Peak Flow --      Pain Score  09/14/20 1041 0     Pain Loc --      Pain Edu? --      Excl. in GC? --    No data found.  Updated Vital Signs BP (!) 144/77 (BP Location: Left Arm) Comment (BP Location): large cuff  Pulse 80   Temp 98.6 F (37 C) (Oral)   Resp (!) 25   LMP 07/18/2000 (Approximate)   SpO2 95%   Visual Acuity Right Eye Distance:   Left Eye Distance:   Bilateral Distance:    Right Eye Near:   Left Eye Near:    Bilateral Near:     Physical Exam Vitals and nursing note reviewed.  Constitutional:      General: She is not in acute distress.    Appearance: Normal appearance. She is not ill-appearing, toxic-appearing or diaphoretic.  HENT:     Head: Normocephalic and atraumatic.     Nose: Congestion present. No rhinorrhea.     Mouth/Throat:     Mouth: Mucous membranes are moist.     Pharynx: No oropharyngeal exudate or posterior oropharyngeal erythema.  Eyes:     General: No scleral icterus.       Right eye: No discharge.        Left eye: No discharge.     Extraocular Movements: Extraocular movements intact.     Conjunctiva/sclera: Conjunctivae normal.     Pupils: Pupils are equal, round, and reactive to light.  Cardiovascular:     Rate and Rhythm: Normal rate and regular rhythm.     Pulses: Normal pulses.     Heart sounds: Normal heart sounds. No murmur heard.   No friction rub. No gallop.  Pulmonary:     Effort: Pulmonary effort is normal. Tachypnea  present.     Breath sounds: No stridor. Examination of the right-upper field reveals wheezing. Examination of the left-upper field reveals wheezing. Examination of the right-middle field reveals wheezing. Examination of the left-middle field reveals wheezing. Examination of the right-lower field reveals wheezing. Examination of the left-lower field reveals wheezing. Wheezing present. No decreased breath sounds, rhonchi or rales.     Comments: By my independent exam patient is breathing 22 times per minute.  She thinks that she is essentially at her baseline.  She does have an expiratory wheeze throughout all lung fields. Musculoskeletal:     Cervical back: Normal range of motion and neck supple.  Skin:    General: Skin is warm and dry.     Capillary Refill: Capillary refill takes less than 2 seconds.  Neurological:     General: No focal deficit present.     Mental Status: She is alert and oriented to person, place, and time.    Clinical impression:    UC Treatments / Results  Labs (all labs ordered are listed, but only abnormal results are displayed) Labs Reviewed - No data to display  EKG   Radiology No results found.  Procedures Procedures (including critical care time)  Medications Ordered in UC Medications - No data to display  Initial Impression / Assessment and Plan / UC Course  I have reviewed the triage vital signs and the nursing notes.  Pertinent labs & imaging results that were available during my care of the patient were reviewed by me and considered in my medical decision making (see chart for details).  Clinical impression: 1.  Upper respiratory infection. 2.  Clinically has bronchitis 3.  History of COPD 4.  History of asthma. 5.  Wheezing  on examination 6.  Cough and chest congestion.  Treatment plan: 1.  The findings and treatment plan were discussed in detail with the patient.  Patient was in agreement. 2.  She is afebrile and is doing quite well on  examination.  I do not feel the need to test her for COVID or influenza.  She does have a home test that she can use for COVID. 3.  Go to go ahead and treat her for bronchitis given her history of asthma and COPD.  Sent in a prescription for steroids as well as azithromycin. 4.  I want her to continue with her inhalers. 5.  Prescribed cough medicine. 6.  She has an appointment with her pulmonologist coming up soon and I encouraged her to keep that and make sure that she is progressing appropriately. 7.  Educational handouts were provided. 8.  Supportive care, over-the-counter meds as needed, Tylenol or Motrin for any discomfort. 9.  She should call 911 or go to the ER if she has worsening symptoms. 10.  She was stable upon discharge and will follow-up here as needed.    Final Clinical Impressions(s) / UC Diagnoses   Final diagnoses:  Cough  Chest congestion  Bronchitis  Upper respiratory tract infection, unspecified type  History of COPD  History of asthma  Wheezing     Discharge Instructions      As we discussed, you do have an expiratory wheeze on examination.  I am electing to go ahead and treat you given your history of asthma and COPD.  Clinically you develop bronchitis.  Will treat you with azithromycin and steroids.  Please continue with your inhalers and pulmonary toilet. Please follow-up with Dr. Belia Heman as scheduled. Please see educational handouts. If you have increasing symptoms then I would encourage you to call 911 or go to the ER.     ED Prescriptions     Medication Sig Dispense Auth. Provider   azithromycin (ZITHROMAX) 250 MG tablet Take 1 tablet (250 mg total) by mouth daily. Take first 2 tablets together, then 1 every day until finished. 6 tablet Delton See, MD   predniSONE (DELTASONE) 10 MG tablet Take 2 tablets (20 mg total) by mouth daily. 10 tablet Delton See, MD   promethazine-dextromethorphan (PROMETHAZINE-DM) 6.25-15 MG/5ML syrup Take 5 mLs by  mouth 4 (four) times daily as needed for cough. 180 mL Delton See, MD      PDMP not reviewed this encounter.   Delton See, MD 09/14/20 1141

## 2020-09-18 ENCOUNTER — Telehealth: Payer: Self-pay | Admitting: Internal Medicine

## 2020-09-18 NOTE — Telephone Encounter (Signed)
Contacted feeling great and requested a 30 day download.  Report will be faxed to our office.

## 2020-09-19 ENCOUNTER — Ambulatory Visit (INDEPENDENT_AMBULATORY_CARE_PROVIDER_SITE_OTHER): Payer: Self-pay | Admitting: Internal Medicine

## 2020-09-19 ENCOUNTER — Encounter: Payer: Self-pay | Admitting: Internal Medicine

## 2020-09-19 ENCOUNTER — Other Ambulatory Visit: Payer: Self-pay

## 2020-09-19 VITALS — BP 130/80 | HR 75 | Temp 98.0°F | Ht 67.5 in | Wt 315.0 lb

## 2020-09-19 DIAGNOSIS — R918 Other nonspecific abnormal finding of lung field: Secondary | ICD-10-CM

## 2020-09-19 NOTE — Patient Instructions (Addendum)
Continue inhalers as prescribed Recommend weight loss Continue CPAP as prescribed CT CHEST IN 1 year

## 2020-09-19 NOTE — Progress Notes (Signed)
@Patient  ID: , female    DOB: June 14, 1960, 60 y.o.   MRN: [id number]   Referring provider: No ref. provider found  SYNOPSIS 60 year old female former smoker followed in our office for asthma, obstructive sleep apnea  PMH:.  Obesity, GERD Smoker/ Smoking History: Former smoker.  Quit 2000.  4-pack-year smoking Maintenance: Breo 100   CPAP compliance report 02/16/20-03/16/20: 30/30 day usage, 0.3 AHI. She wants 03/18/20 to assume responsibility for OSA not ENT.     Covid positive 10/21 - Covid positive soon after 2nd vaccination,booster due in March. Received MAB 11/22/19, states 24 hours after infusion sense of taste and smell returned.     CC FOLLOW UP OSA FOLLOW UP ASTHMA Follow up abnormal CT chest   HPI   01/22/20 + exacerbation at this time Currently on prednisone at this time  No evidence of heart failure at this time No respiratory distress No fevers, chills, nausea, vomiting, diarrhea No evidence of lower extremity edema No evidence hemoptysis  On Breo 100 daily maintenance therapy and doing well On Albuterol and uses infrequently Mild intermittent asthma controlled  RE:OSA Previous download shows excellent compliance report AHI reduced to 0.3  GERD under Control    RE-ABNORMAL CT CHEST Independently Reviewed By Me Today Very minuscule LLL nodule on CT chest Follow up in 1 year  Tests:   FENO:  No results found for: NITRICOXIDE  PFT: PFT Results Latest Ref Rng & Units 08/15/2014  FVC-Pre L 2.82  FVC-Predicted Pre % 73  FVC-Post L 2.87  FVC-Predicted Post % 74  Pre FEV1/FVC % % 77  Post FEV1/FCV % % 78  FEV1-Pre L 2.17  FEV1-Predicted Pre % 72  FEV1-Post L 2.22  DLCO uncorrected ml/min/mmHg 33.37  DLCO UNC% % 115  DLVA Predicted % 108    WALK:  SIX MIN WALK 08/15/2014  Medications Claritin, Advair @ 10am  Supplimental Oxygen during Test? (L/min) No  Laps 7  Partial Lap (in Meters) 9  Baseline BP (sitting) 134/80   Baseline Heartrate 82  Baseline Dyspnea (Borg Scale) 2  Baseline Fatigue (Borg Scale) 2  Baseline SPO2 93  BP (sitting) 148/88  Heartrate 107  Dyspnea (Borg Scale) 3  Fatigue (Borg Scale) 3  SPO2 96  BP (sitting) 132/84  Heartrate 93  SPO2 96  Stopped or Paused before Six Minutes No  Distance Completed 345  Tech Comments: Pt walked at moderate pace w/o complications.  Provider Comments: Noted, see clinic note      Specialty Problems       Pulmonary Problems   Extrinsic asthma    Pulmonary function testing 08/15/2014 FVC 73% FEV1 72% FEV1/FVC 77% FRC 41% RV 51% TLC 65% RV/TLC 76% Impression: Moderate obstruction with no stomach and broncho-dilator response. Severe to moderate decrease in RV and TLC's, suggesting a restrictive component, severe reduction in ERV, these findings together can be seen in abdominal/chest wall obesity. Clinical correlation is considered/advised. 6 minute walk test: 1131 feet/345 m, low saturation 93%, highest heart rate 107.      URI (upper respiratory infection)   Snoring   Asthma without status asthmaticus   OSA (obstructive sleep apnea)   Shortness of breath    Allergies  Allergen Reactions   Codeine Nausea And Vomiting    Can't take it in pill form    Immunization History  Administered Date(s) Administered   Influenza,inj,Quad PF,6+ Mos 12/08/2014, 12/09/2017   PFIZER(Purple Top)SARS-COV-2 Vaccination 10/26/2019, 11/16/2019    Past Medical History:  Diagnosis  Date   Allergic rhinitis    Asthma without status asthmaticus    COVID-19 virus infection    a. 11/2019; b. 06/2020.   Diabetes mellitus without complication (HCC) 0814,4818   gestational   GERD (gastroesophageal reflux disease)    Hyperlipidemia    IGT (impaired glucose tolerance)    Morbid obesity (HCC)    Obesity    Osteopenia    Sleep apnea    Vertigo    Vitamin D deficiency     Tobacco History: Social History   Tobacco Use  Smoking Status Former    Packs/day: 1.00   Years: 8.00   Pack years: 8.00   Types: Cigarettes   Quit date: 2000   Years since quitting: 22.6  Smokeless Tobacco Never  Tobacco Comments   quit smoking in 2000   Counseling given: Not Answered Tobacco comments: quit smoking in 2000   Continue to not smoke  Outpatient Encounter Medications as of 09/19/2020  Medication Sig   albuterol (PROVENTIL) (2.5 MG/3ML) 0.083% nebulizer solution Take 3 mLs (2.5 mg total) by nebulization every 6 (six) hours as needed for wheezing or shortness of breath.   albuterol (VENTOLIN HFA) 108 (90 Base) MCG/ACT inhaler Inhale 1-2 puffs into the lungs every 6 (six) hours as needed for wheezing or shortness of breath.   azithromycin (ZITHROMAX) 250 MG tablet Take 1 tablet (250 mg total) by mouth daily. Take first 2 tablets together, then 1 every day until finished.   cetirizine (ZYRTEC) 10 MG tablet Take 10 mg by mouth daily.   diphenhydramine-acetaminophen (TYLENOL PM) 25-500 MG TABS tablet Take 1 tablet by mouth at bedtime.   fluticasone furoate-vilanterol (BREO ELLIPTA) 100-25 MCG/INH AEPB Inhale 1 puff into the lungs daily.   meloxicam (MOBIC) 15 MG tablet Take 1 tablet (15 mg total) by mouth daily as needed for pain. (Patient not taking: Reported on 09/14/2020)   omeprazole (PRILOSEC) 20 MG capsule Take 20 mg by mouth every evening.    predniSONE (DELTASONE) 10 MG tablet Take 2 tablets (20 mg total) by mouth daily.   promethazine-dextromethorphan (PROMETHAZINE-DM) 6.25-15 MG/5ML syrup Take 5 mLs by mouth 4 (four) times daily as needed for cough.   triamcinolone (NASACORT) 55 MCG/ACT AERO nasal inhaler Place 2 sprays into the nose daily.   No facility-administered encounter medications on file as of 09/19/2020.     Review of Systems:  Gen:  Denies  fever, sweats, chills weight loss  HEENT: Denies blurred vision, double vision, ear pain, eye pain, hearing loss, nose bleeds, sore throat Cardiac:  No dizziness, chest pain or heaviness,  chest tightness,edema, No JVD Resp:   No cough, -sputum production, -shortness of breath,-wheezing, -hemoptysis,  Other:  All other systems negative  BP 130/80 (BP Location: Left Arm, Patient Position: Sitting, Cuff Size: Normal)   Pulse 75   Temp 98 F (36.7 C) (Oral)   Ht 5' 7.5" (1.715 m)   Wt (!) 315 lb (142.9 kg)   LMP 07/18/2000 (Approximate)   SpO2 98%   BMI 48.61 kg/m     Physical Examination:   General Appearance: No distress  EYES PERRLA, EOM intact.   NECK Supple, No JVD Pulmonary: normal breath sounds, No wheezing.  CardiovascularNormal S1,S2.  No m/r/g.   Abdomen: Benign, Soft, non-tender. Skin:   warm, no rashes, no ecchymosis  Extremities: normal, no cyanosis, clubbing. Neuro:without focal findings,  speech normal  PSYCHIATRIC: Mood, affect within normal limits.   ALL OTHER ROS ARE NEGATIVE  Assessment & Plan:   60 year old pleasant white female seen today for follow-up for mild intermittent asthma along with obstructive sleep apnea in the setting of obesity and deconditioned state  Mild intermittent asthma Controlled with Breo and albuterol as needed Avoid triggers Continue maintenance therapy   OSA Compliance report reviewed with patient in detail  GERD Continue PPI  Obesity -recommend significant weight loss -recommend changing diet  Deconditioned state -Recommend increased daily activity and exercise      RE-ABNORMAL CT CHEST Independently Reviewed By Me Today Very minuscule LLL nodule on CT chest Follow up in 1 year   MEDICATION ADJUSTMENTS/LABS AND TESTS ORDERED: Continue inhalers as prescribed Recommend weight loss Continue CPAP as prescribed    CURRENT MEDICATIONS REVIEWED AT LENGTH WITH PATIENT TODAY   Patient  satisfied with Plan of action and management. All questions answered  Follow up 1 year  Total Time Spent  35 mins   Zarek Relph Santiago Glad, M.D.  [facility]  Medical  Director [facility] Medical Director [facility] Cardio-Pulmonary Department

## 2020-11-23 ENCOUNTER — Ambulatory Visit
Admission: EM | Admit: 2020-11-23 | Discharge: 2020-11-23 | Disposition: A | Discharge status: Home / Self Care | Payer: Self-pay | Attending: Physician Assistant | Admitting: Physician Assistant

## 2020-11-23 ENCOUNTER — Other Ambulatory Visit: Payer: Self-pay

## 2020-11-23 ENCOUNTER — Encounter: Payer: Self-pay | Admitting: Emergency Medicine

## 2020-11-23 DIAGNOSIS — Z8616 Personal history of COVID-19: Secondary | ICD-10-CM | POA: Insufficient documentation

## 2020-11-23 DIAGNOSIS — R051 Acute cough: Secondary | ICD-10-CM

## 2020-11-23 DIAGNOSIS — J45901 Unspecified asthma with (acute) exacerbation: Secondary | ICD-10-CM

## 2020-11-23 DIAGNOSIS — Z20822 Contact with and (suspected) exposure to covid-19: Secondary | ICD-10-CM | POA: Insufficient documentation

## 2020-11-23 DIAGNOSIS — Z7951 Long term (current) use of inhaled steroids: Secondary | ICD-10-CM | POA: Insufficient documentation

## 2020-11-23 DIAGNOSIS — Z87891 Personal history of nicotine dependence: Secondary | ICD-10-CM | POA: Insufficient documentation

## 2020-11-23 DIAGNOSIS — R0602 Shortness of breath: Secondary | ICD-10-CM | POA: Insufficient documentation

## 2020-11-23 DIAGNOSIS — Z7952 Long term (current) use of systemic steroids: Secondary | ICD-10-CM | POA: Insufficient documentation

## 2020-11-23 DIAGNOSIS — J40 Bronchitis, not specified as acute or chronic: Secondary | ICD-10-CM | POA: Insufficient documentation

## 2020-11-23 MED ORDER — GUAIFENESIN-CODEINE 100-10 MG/5ML PO SOLN
5.0000 mL | Freq: Four times a day (QID) | ORAL | 0 refills | Status: AC | PRN
Start: 2020-11-23 — End: ?

## 2020-11-23 MED ORDER — AZITHROMYCIN 250 MG PO TABS: 250.0000 mg | ORAL_TABLET | Freq: Every day | ORAL | 0 refills | Status: DC

## 2020-11-23 MED ORDER — PREDNISONE 20 MG PO TABS: 40.0000 mg | ORAL_TABLET | Freq: Every day | ORAL | 0 refills | Status: AC

## 2020-11-23 NOTE — ED Provider Notes (Signed)
[facility]    CSN: [id number] Arrival date & time: 11/23/20  1135      History   Chief Complaint Chief Complaint  Patient presents with   Nasal Congestion   Cough    HPI Kimberly Vasquez is a 60 y.o. female presenting for 3-day history of productive cough, chest tightness, wheezing, nasal congestion and mild sore throat.  Patient also mitts to runny nose.  Admits to fatigue but denies any fever or body aches.  No sick contacts or known exposure to COVID-19.  Previous history of COVID-19 5 months ago.  She reports having COVID infection in October 2021 as well.  Has been vaccine for COVID-19 x2.  Patient has history of asthma and she says that her pulmonologist also told her that she likely has COPD.  Patient is a former smoker.  Patient says she has been taking Mucinex DM and using her breathing treatments but continues to have wheezing and shortness of breath.  She fears she may have a chest cold/bronchitis.  No other complaints.  HPI  Past Medical History:  Diagnosis Date   Allergic rhinitis    Asthma without status asthmaticus    COVID-19 virus infection    a. 11/2019; b. 06/2020.   Diabetes mellitus without complication (HCC) 1093,2355   gestational   GERD (gastroesophageal reflux disease)    Hyperlipidemia    IGT (impaired glucose tolerance)    Morbid obesity (HCC)    Obesity    Osteopenia    Sleep apnea    Vertigo    Vitamin D deficiency     Patient Active Problem List   Diagnosis Date Noted   Diabetes mellitus without complication (HCC)    Obesity    COVID-19 virus infection 06/2020   OSA (obstructive sleep apnea) 03/17/2020   Shortness of breath 03/17/2020   History of COVID-19 03/17/2020   Asthma without status asthmaticus 11/16/2015   IGT (impaired glucose tolerance) 11/16/2015   Gastroesophageal reflux disease 11/14/2015   Snoring 11/14/2015   URI (upper respiratory infection) 04/19/2015   Morbid obesity (HCC) 08/15/2014   Extrinsic  asthma 06/14/2014    Past Surgical History:  Procedure Laterality Date   ABDOMINAL HYSTERECTOMY  2001   BREAST BIOPSY Right 01/06/2020   stereo bx, ribbon clip, ATYPICAL GLANDULAR PROLIFERATION   BREAST BIOPSY WITH RADIO FREQUENCY LOCALIZER Right 02/02/2020   Procedure: BREAST BIOPSY WITH RADIO FREQUENCY LOCALIZER;  Surgeon: Carolan Shiver, MD;  Location: [facility] ORS;  Service: General;  Laterality: Right;   BREAST CYST ASPIRATION Left    BREAST EXCISIONAL BIOPSY Right 1993   benign, unable to see scar    BREAST EXCISIONAL BIOPSY Left 1979   benign   CHOLECYSTECTOMY     ESOPHAGOGASTRODUODENOSCOPY (EGD) WITH PROPOFOL N/A 01/02/2016   Procedure: ESOPHAGOGASTRODUODENOSCOPY (EGD) WITH PROPOFOL;  Surgeon: Midge Minium, MD;  Location: [facility] ENDOSCOPY;  Service: Endoscopy;  Laterality: N/A;    OB History   No obstetric history on file.      Home Medications    Prior to Admission medications   Medication Sig Start Date End Date Taking? Authorizing Provider  albuterol (PROVENTIL) (2.5 MG/3ML) 0.083% nebulizer solution Take 3 mLs (2.5 mg total) by nebulization every 6 (six) hours as needed for wheezing or shortness of breath. 04/02/20  Yes Burky, Barron Alvine, NP  albuterol (VENTOLIN HFA) 108 (90 Base) MCG/ACT inhaler Inhale 1-2 puffs into the lungs every 6 (six) hours as needed for wheezing or shortness of breath. 01/28/19  Yes Kasa, Wallis Bamberg,  MD  azithromycin (ZITHROMAX) 250 MG tablet Take 1 tablet (250 mg total) by mouth daily. Take first 2 tablets together, then 1 every day until finished. 11/23/20  Yes Shirlee Latch, PA-C  cetirizine (ZYRTEC) 10 MG tablet Take 10 mg by mouth daily.   Yes [provider]  diphenhydramine-acetaminophen (TYLENOL PM) 25-500 MG TABS tablet Take 1 tablet by mouth at bedtime.   Yes [provider]  fluticasone furoate-vilanterol (BREO ELLIPTA) 100-25 MCG/INH AEPB Inhale 1 puff into the lungs daily. 03/17/20  Yes Erin Fulling, MD   guaiFENesin-codeine 100-10 MG/5ML syrup Take 5 mLs by mouth every 6 (six) hours as needed for cough. 11/23/20  Yes Shirlee Latch, PA-C  omeprazole (PRILOSEC) 20 MG capsule Take 20 mg by mouth every evening.    Yes [provider]  triamcinolone (NASACORT) 55 MCG/ACT AERO nasal inhaler Place 2 sprays into the nose daily.   Yes [provider]  meloxicam (MOBIC) 15 MG tablet Take 1 tablet (15 mg total) by mouth daily as needed for pain. 07/11/20   Tommie Sams, DO  predniSONE (DELTASONE) 20 MG tablet Take 2 tablets (40 mg total) by mouth daily for 5 days. 11/23/20 11/28/20  Shirlee Latch, PA-C  promethazine-dextromethorphan (PROMETHAZINE-DM) 6.25-15 MG/5ML syrup Take 5 mLs by mouth 4 (four) times daily as needed for cough. 09/14/20   Delton See, MD    Family History Family History  Problem Relation Age of Onset   Heart disease Mother    Asthma Mother    Diabetes Mother    Stroke Father    Breast cancer Paternal Grandmother 3    Social History Social History   Tobacco Use   Smoking status: Former    Packs/day: 1.00    Years: 8.00    Pack years: 8.00    Types: Cigarettes    Quit date: 2000    Years since quitting: 22.7   Smokeless tobacco: Never   Tobacco comments:    quit smoking in 2000  Vaping Use   Vaping Use: Never used  Substance Use Topics   Alcohol use: Yes    Alcohol/week: 0.0 standard drinks    Comment: occasional   Drug use: No     Allergies   Codeine   Review of Systems Review of Systems  Constitutional:  Positive for fatigue. Negative for chills, diaphoresis and fever.  HENT:  Positive for congestion, rhinorrhea and sore throat. Negative for ear pain, sinus pressure and sinus pain.   Respiratory:  Positive for cough, chest tightness, shortness of breath and wheezing.   Cardiovascular:  Negative for chest pain.  Gastrointestinal:  Negative for abdominal pain, nausea and vomiting.  Musculoskeletal:  Negative for arthralgias and  myalgias.  Skin:  Negative for rash.  Neurological:  Negative for weakness and headaches.  Hematological:  Negative for adenopathy.    Physical Exam Triage Vital Signs ED Triage Vitals  Enc Vitals Group     BP 11/23/20 1246 125/87     Pulse Rate 11/23/20 1246 87     Resp 11/23/20 1246 20     Temp 11/23/20 1246 98.1 F (36.7 C)     Temp Source 11/23/20 1246 Oral     SpO2 11/23/20 1246 97 %     Weight 11/23/20 1243 (!) 315 lb 0.6 oz (142.9 kg)     Height 11/23/20 1243 5' 7.5" (1.715 m)     Head Circumference --      Peak Flow --  Pain Score 11/23/20 1243 0     Pain Loc --      Pain Edu? --      Excl. in GC? --    No data found.  Updated Vital Signs BP 125/87 (BP Location: Left Arm)   Pulse 87   Temp 98.1 F (36.7 C) (Oral)   Resp 20   Ht 5' 7.5" (1.715 m)   Wt (!) 315 lb 0.6 oz (142.9 kg)   LMP 07/18/2000 (Approximate)   SpO2 97%   BMI 48.61 kg/m      Physical Exam Vitals and nursing note reviewed.  Constitutional:      General: She is not in acute distress.    Appearance: Normal appearance. She is obese. She is not ill-appearing or toxic-appearing.  HENT:     Head: Normocephalic and atraumatic.     Nose: Congestion present.     Mouth/Throat:     Mouth: Mucous membranes are moist.     Pharynx: Oropharynx is clear. Posterior oropharyngeal erythema (mild with clear PND) present.  Eyes:     General: No scleral icterus.       Right eye: No discharge.        Left eye: No discharge.     Conjunctiva/sclera: Conjunctivae normal.  Cardiovascular:     Rate and Rhythm: Normal rate and regular rhythm.     Heart sounds: Normal heart sounds.  Pulmonary:     Effort: Pulmonary effort is normal. No respiratory distress.     Breath sounds: Wheezing (scattered wheezes throughout chest) present.  Musculoskeletal:     Cervical back: Neck supple.  Skin:    General: Skin is dry.  Neurological:     General: No focal deficit present.     Mental Status: She is alert.  Mental status is at baseline.     Motor: No weakness.     Gait: Gait normal.  Psychiatric:        Mood and Affect: Mood normal.        Behavior: Behavior normal.        Thought Content: Thought content normal.     UC Treatments / Results  Labs (all labs ordered are listed, but only abnormal results are displayed) Labs Reviewed  SARS CORONAVIRUS 2 (TAT 6-24 HRS)    EKG   Radiology No results found.  Procedures Procedures (including critical care time)  Medications Ordered in UC Medications - No data to display  Initial Impression / Assessment and Plan / UC Course  I have reviewed the triage vital signs and the nursing notes.  Pertinent labs & imaging results that were available during my care of the patient were reviewed by me and considered in my medical decision making (see chart for details).  60 year old female with history of asthma and COPD presenting for 3-day history of productive cough, wheezing and increased shortness of breath.  No fevers.  Vitals all normal and stable and she is overall well-appearing.  Exam reveals nasal congestion, mild posterior pharyngeal erythema with clear postnasal drainage and diffuse wheezing throughout chest.  Patient is having no respiratory distress.  Oxygen is 97%.  PCR COVID test obtained.  Current CDC guidelines, isolation protocol and ED precautions reviewed patient.  If positive she would be a candidate for antiviral therapy and I discussed this with her.  Reviewed how to access results.  Symptoms consistent with likely viral illness and asthma exacerbation.  Treating at this time with prednisone and Cheratussin.  Did review controlled substance  database.  Patient low risk for abuse.  She has allergy to codeine pills but can take liquid.  Printed a prescription for azithromycin in case she is not feeling better in the next few days given her history of COPD.  Thoroughly reviewed ED precautions.  Final Clinical Impressions(s) / UC  Diagnoses   Final diagnoses:  Bronchitis  Acute cough  Asthma with acute exacerbation, unspecified asthma severity, unspecified whether persistent     Discharge Instructions      -Symptoms are consistent with a chest cold or bronchitis.  Her husband is also flareup.  I have sent prednisone for this.  I have also sent a cough syrup.  Increase rest and fluids. -If you are not feeling better in a few days your COVID test is negative you can try the azithromycin but you may not need it. -COVID test be back tomorrow.  If you are positive you to be isolated 5 days and wear mask for 5 days.  We will call you with any positive results and start you on antiviral medications if you are positive. -If you start to have a fever or increased breathing difficulty you need to be seen again.  For any severe acute worsening symptoms please go to emergency department.      ED Prescriptions     Medication Sig Dispense Auth. Provider   predniSONE (DELTASONE) 20 MG tablet Take 2 tablets (40 mg total) by mouth daily for 5 days. 10 tablet Eusebio Friendly B, PA-C   guaiFENesin-codeine 100-10 MG/5ML syrup Take 5 mLs by mouth every 6 (six) hours as needed for cough. 118 mL Eusebio Friendly B, PA-C   azithromycin (ZITHROMAX) 250 MG tablet Take 1 tablet (250 mg total) by mouth daily. Take first 2 tablets together, then 1 every day until finished. 6 tablet Gareth Morgan      PDMP not reviewed this encounter.   Shirlee Latch, PA-C 11/23/20 1424

## 2020-11-23 NOTE — ED Triage Notes (Signed)
Pt c/o cough, wheezing, nasal congestion. Runny nose. Started about 3 days ago. Denies fever. Declines covid testing at this time.

## 2020-11-23 NOTE — Discharge Instructions (Signed)
-  Symptoms are consistent with a chest cold or bronchitis.  Her husband is also flareup.  I have sent prednisone for this.  I have also sent a cough syrup.  Increase rest and fluids. -If you are not feeling better in a few days your COVID test is negative you can try the azithromycin but you may not need it. -COVID test be back tomorrow.  If you are positive you to be isolated 5 days and wear mask for 5 days.  We will call you with any positive results and start you on antiviral medications if you are positive. -If you start to have a fever or increased breathing difficulty you need to be seen again.  For any severe acute worsening symptoms please go to emergency department.

## 2020-11-24 LAB — SARS CORONAVIRUS 2 (TAT 6-24 HRS): SARS Coronavirus 2: NEGATIVE

## 2021-02-27 ENCOUNTER — Telehealth: Payer: Self-pay

## 2021-02-27 NOTE — Telephone Encounter (Signed)
Patient is bringing in Patient assistance paperwork  for Breo on Wednesday 12/18 to be signed by Dr Mortimer Fries on Thursday 12/19 since he will be in office. Routing to Dr Mortimer Fries as an Juluis Rainier.

## 2021-03-07 ENCOUNTER — Telehealth: Payer: Self-pay | Admitting: Internal Medicine

## 2021-03-07 MED ORDER — FLUTICASONE FUROATE-VILANTEROL 100-25 MCG/ACT IN AEPB: 1.0000 | INHALATION_SPRAY | Freq: Every day | RESPIRATORY_TRACT | 11 refills | Status: DC

## 2021-03-07 NOTE — Telephone Encounter (Signed)
Patient dropped off patient assistance forms for Breo. Patient would like to pickup forms in enclosed envelope once complete.

## 2021-03-08 NOTE — Telephone Encounter (Signed)
Rx signed, placed in enclosed envelope and sent to outgoing mail. Patient is aware and voiced her understanding.  Nothing further needed at this time.

## 2021-04-03 ENCOUNTER — Other Ambulatory Visit: Payer: Self-pay

## 2021-04-03 DIAGNOSIS — Z1231 Encounter for screening mammogram for malignant neoplasm of breast: Secondary | ICD-10-CM

## 2021-04-24 ENCOUNTER — Other Ambulatory Visit: Payer: Self-pay

## 2021-04-24 ENCOUNTER — Ambulatory Visit
Admission: RE | Admit: 2021-04-24 | Discharge: 2021-04-24 | Disposition: A | Discharge status: Home / Self Care | Payer: Self-pay | Source: Ambulatory Visit | Attending: Internal Medicine | Admitting: Internal Medicine

## 2021-04-24 DIAGNOSIS — R918 Other nonspecific abnormal finding of lung field: Secondary | ICD-10-CM

## 2021-05-01 ENCOUNTER — Ambulatory Visit: Payer: Self-pay

## 2021-05-26 ENCOUNTER — Ambulatory Visit
Admission: EM | Admit: 2021-05-26 | Discharge: 2021-05-26 | Disposition: A | Discharge status: Home / Self Care | Payer: Self-pay | Attending: Student | Admitting: Student

## 2021-05-26 DIAGNOSIS — J4541 Moderate persistent asthma with (acute) exacerbation: Secondary | ICD-10-CM

## 2021-05-26 DIAGNOSIS — J0100 Acute maxillary sinusitis, unspecified: Secondary | ICD-10-CM

## 2021-05-26 MED ORDER — PREDNISONE 10 MG (21) PO TBPK: ORAL_TABLET | Freq: Every day | ORAL | 0 refills | Status: DC

## 2021-05-26 MED ORDER — AMOXICILLIN-POT CLAVULANATE 875-125 MG PO TABS: 1.0000 | ORAL_TABLET | Freq: Two times a day (BID) | ORAL | 0 refills | Status: DC

## 2021-05-26 NOTE — Discharge Instructions (Addendum)
-  Prednisone taper for cough/bronchitis. I recommend taking this in the morning as it could give you energy.  Avoid NSAIDs like ibuprofen and alleve while taking this medication as they can increase your risk of stomach upset and even GI bleeding when in combination with a steroid. You can continue tylenol (acetaminophen) up to 1000mg  3x daily. ?-Start the antibiotic-Augmentin (amoxicillin-clavulanate), 1 pill every 12 hours for 7 days.  You can take this with food like with breakfast and dinner. ?-Continue inhalers  ?-Albuterol inhaler or nebulizer as needed for cough, wheezing, shortness of breath, 1 to 2 puffs every 6 hours as needed. ?-Follow-up if symptoms getting worse - shortness of breath at rest, new chest pain, new fevers, coughing up blood, etc.  ? ?

## 2021-05-26 NOTE — ED Triage Notes (Signed)
Pt present a cough with nasal congestion and sob. Symptoms started two weeks ago. Pt tried otc medication with no relief.  ?

## 2021-05-26 NOTE — ED Provider Notes (Signed)
?[facility] ? ? ? ?CSN: [id number] ?Arrival date & time: 05/26/21  0807 ? ? ?  ? ?History   ?Chief Complaint ?Chief Complaint  ?Patient presents with  ? Nasal Congestion  ? Cough  ? ? ?HPI ?Kimberly Vasquez is a 61 y.o. female progressively worsening sinus congestion and cough for the last 2 weeks.  History of asthma that is controlled on albuterol and Breo Ellipta inhalers, and prediabetes.  States that she and her family were sick about 2 weeks ago, some symptoms improved, but the nasal congestion has gotten progressively worse with purulent postnasal drip and pressure behind her nose and eyes.  Cough is nonproductive.  There is some dyspnea on exertion, and wheezing.  She has not used her albuterol inhaler or nebulizer at all, but she is still using the Breo Ellipta in the morning.  Denies fever/chills, shortness of breath at rest, chest pain, dizziness, weakness. She does also have allergies this time of year. ? ?HPI ? ?Past Medical History:  ?Diagnosis Date  ? Allergic rhinitis   ? Asthma without status asthmaticus   ? COVID-19 virus infection   ? a. 11/2019; b. 06/2020.  ? Diabetes mellitus without complication (HCC) [id number]  ? gestational  ? GERD (gastroesophageal reflux disease)   ? Hyperlipidemia   ? IGT (impaired glucose tolerance)   ? Morbid obesity (HCC)   ? Obesity   ? Osteopenia   ? Sleep apnea   ? Vertigo   ? Vitamin D deficiency   ? ? ?Patient Active Problem List  ? Diagnosis Date Noted  ? Diabetes mellitus without complication (HCC)   ? Obesity   ? COVID-19 virus infection 06/2020  ? OSA (obstructive sleep apnea) 03/17/2020  ? Shortness of breath 03/17/2020  ? History of COVID-19 03/17/2020  ? Asthma without status asthmaticus 11/16/2015  ? IGT (impaired glucose tolerance) 11/16/2015  ? Gastroesophageal reflux disease 11/14/2015  ? Snoring 11/14/2015  ? URI (upper respiratory infection) 04/19/2015  ? Morbid obesity (HCC) 08/15/2014  ? Extrinsic asthma 06/14/2014  ? ? ?Past Surgical  History:  ?Procedure Laterality Date  ? ABDOMINAL HYSTERECTOMY  2001  ? BREAST BIOPSY Right 01/06/2020  ? stereo bx, ribbon clip, ATYPICAL GLANDULAR PROLIFERATION  ? BREAST BIOPSY WITH RADIO FREQUENCY LOCALIZER Right 02/02/2020  ? Procedure: BREAST BIOPSY WITH RADIO FREQUENCY LOCALIZER;  Surgeon: Carolan Shiverintron-Diaz, Edgardo, MD;  Location: [facility];  Service: General;  Laterality: Right;  ? BREAST CYST ASPIRATION Left   ? BREAST EXCISIONAL BIOPSY Right 1993  ? benign, unable to see scar   ? BREAST EXCISIONAL BIOPSY Left 1979  ? benign  ? CHOLECYSTECTOMY    ? ESOPHAGOGASTRODUODENOSCOPY (EGD) WITH PROPOFOL N/A 01/02/2016  ? Procedure: ESOPHAGOGASTRODUODENOSCOPY (EGD) WITH PROPOFOL;  Surgeon: Midge Miniumarren Wohl, MD;  Location: [facility];  Service: Endoscopy;  Laterality: N/A;  ? ? ?OB History   ?No obstetric history on file. ?  ? ? ? ?Home Medications   ? ?Prior to Admission medications   ?Medication Sig Start Date End Date Taking? Authorizing Provider  ?amoxicillin-clavulanate (AUGMENTIN) 875-125 MG tablet Take 1 tablet by mouth every 12 (twelve) hours. 05/26/21  Yes Rhys MartiniGraham, Blessings Inglett E, PA-C  ?predniSONE (STERAPRED UNI-PAK 21 TAB) 10 MG (21) TBPK tablet Take by mouth daily. Take 6 tabs by mouth daily  for 2 days, then 5 tabs for 2 days, then 4 tabs for 2 days, then 3 tabs for 2 days, 2 tabs for 2 days, then 1 tab by mouth daily for 2 days 05/26/21  Yes Rhys Martini, PA-C  ?albuterol (PROVENTIL) (2.5 MG/3ML) 0.083% nebulizer solution Take 3 mLs (2.5 mg total) by nebulization every 6 (six) hours as needed for wheezing or shortness of breath. 04/02/20   Georgetta Haber, NP  ?albuterol (VENTOLIN HFA) 108 (90 Base) MCG/ACT inhaler Inhale 1-2 puffs into the lungs every 6 (six) hours as needed for wheezing or shortness of breath. 01/28/19   Erin Fulling, MD  ?azithromycin (ZITHROMAX) 250 MG tablet Take 1 tablet (250 mg total) by mouth daily. Take first 2 tablets together, then 1 every day until finished. 11/23/20   Shirlee Latch, PA-C   ?cetirizine (ZYRTEC) 10 MG tablet Take 10 mg by mouth daily.    [provider]  ?diphenhydramine-acetaminophen (TYLENOL PM) 25-500 MG TABS tablet Take 1 tablet by mouth at bedtime.    [provider]  ?fluticasone furoate-vilanterol (BREO ELLIPTA) 100-25 MCG/ACT AEPB Inhale 1 puff into the lungs daily. 03/07/21   Erin Fulling, MD  ?fluticasone furoate-vilanterol (BREO ELLIPTA) 100-25 MCG/INH AEPB Inhale 1 puff into the lungs daily. 03/17/20   Erin Fulling, MD  ?guaiFENesin-codeine 100-10 MG/5ML syrup Take 5 mLs by mouth every 6 (six) hours as needed for cough. 11/23/20   Shirlee Latch, PA-C  ?meloxicam (MOBIC) 15 MG tablet Take 1 tablet (15 mg total) by mouth daily as needed for pain. 07/11/20   Tommie Sams, DO  ?omeprazole (PRILOSEC) 20 MG capsule Take 20 mg by mouth every evening.     [provider]  ?promethazine-dextromethorphan (PROMETHAZINE-DM) 6.25-15 MG/5ML syrup Take 5 mLs by mouth 4 (four) times daily as needed for cough. 09/14/20   Delton See, MD  ?triamcinolone (NASACORT) 55 MCG/ACT AERO nasal inhaler Place 2 sprays into the nose daily.    [provider]  ? ? ?Family History ?Family History  ?Problem Relation Age of Onset  ? Heart disease Mother   ? Asthma Mother   ? Diabetes Mother   ? Stroke Father   ? Breast cancer Paternal Grandmother 53  ? ? ?Social History ?Social History  ? ?Tobacco Use  ? Smoking status: Former  ?  Packs/day: 1.00  ?  Years: 8.00  ?  Pack years: 8.00  ?  Types: Cigarettes  ?  Quit date: 2000  ?  Years since quitting: 23.2  ? Smokeless tobacco: Never  ? Tobacco comments:  ?  quit smoking in 2000  ?Vaping Use  ? Vaping Use: Never used  ?Substance Use Topics  ? Alcohol use: Yes  ?  Alcohol/week: 0.0 standard drinks  ?  Comment: occasional  ? Drug use: No  ? ? ? ?Allergies   ?Codeine ? ? ?Review of Systems ?Review of Systems  ?Constitutional:  Negative for appetite change, chills and fever.  ?HENT:  Positive for congestion. Negative for ear  pain, rhinorrhea, sinus pressure, sinus pain and sore throat.   ?Eyes:  Negative for redness and visual disturbance.  ?Respiratory:  Positive for cough, shortness of breath and wheezing. Negative for chest tightness.   ?Cardiovascular:  Negative for chest pain and palpitations.  ?Gastrointestinal:  Negative for abdominal pain, constipation, diarrhea, nausea and vomiting.  ?Genitourinary:  Negative for dysuria, frequency and urgency.  ?Musculoskeletal:  Negative for myalgias.  ?Neurological:  Negative for dizziness, weakness and headaches.  ?Psychiatric/Behavioral:  Negative for confusion.   ?All other systems reviewed and are negative. ? ? ?Physical Exam ?Triage Vital Signs ?ED Triage Vitals  ?Enc Vitals Group  ?   BP 05/26/21 0817 121/90  ?  Pulse Rate 05/26/21 0817 95  ?   Resp 05/26/21 0817 20  ?   Temp 05/26/21 0817 98.3 ?F (36.8 ?C)  ?   Temp Source 05/26/21 0817 Oral  ?   SpO2 05/26/21 0817 94 %  ?   Weight --   ?   Height --   ?   Head Circumference --   ?   Peak Flow --   ?   Pain Score 05/26/21 0816 6  ?   Pain Loc --   ?   Pain Edu? --   ?   Excl. in GC? --   ? ?No data found. ? ?Updated Vital Signs ?BP 121/90 (BP Location: Left Arm)   Pulse 95   Temp 98.3 ?F (36.8 ?C) (Oral)   Resp 20   LMP 07/18/2000 (Approximate)   SpO2 94%  ? ?Visual Acuity ?Right Eye Distance:   ?Left Eye Distance:   ?Bilateral Distance:   ? ?Right Eye Near:   ?Left Eye Near:    ?Bilateral Near:    ? ?Physical Exam ?Vitals reviewed.  ?Constitutional:   ?   General: She is not in acute distress. ?   Appearance: Normal appearance. She is not ill-appearing.  ?HENT:  ?   Head: Normocephalic and atraumatic.  ?   Right Ear: Tympanic membrane, ear canal and external ear normal. No tenderness. No middle ear effusion. There is no impacted cerumen. Tympanic membrane is not perforated, erythematous, retracted or bulging.  ?   Left Ear: Tympanic membrane, ear canal and external ear normal. No tenderness.  No middle ear effusion. There is no  impacted cerumen. Tympanic membrane is not perforated, erythematous, retracted or bulging.  ?   Nose: Congestion present.  ?   Right Sinus: Maxillary sinus tenderness and frontal sinus tenderness present.  ?

## 2021-07-30 ENCOUNTER — Other Ambulatory Visit: Payer: Self-pay

## 2021-07-30 DIAGNOSIS — Z1211 Encounter for screening for malignant neoplasm of colon: Secondary | ICD-10-CM

## 2021-08-08 ENCOUNTER — Ambulatory Visit
Admission: RE | Admit: 2021-08-08 | Discharge: 2021-08-08 | Disposition: A | Discharge status: Home / Self Care | Payer: Self-pay | Source: Ambulatory Visit | Attending: Obstetrics and Gynecology | Admitting: Obstetrics and Gynecology

## 2021-08-08 ENCOUNTER — Ambulatory Visit: Payer: Self-pay | Attending: Hematology and Oncology | Admitting: *Deleted

## 2021-08-08 VITALS — BP 135/61 | Wt 302.7 lb

## 2021-08-08 DIAGNOSIS — Z1231 Encounter for screening mammogram for malignant neoplasm of breast: Secondary | ICD-10-CM | POA: Insufficient documentation

## 2021-08-08 DIAGNOSIS — Z1239 Encounter for other screening for malignant neoplasm of breast: Secondary | ICD-10-CM

## 2021-08-08 NOTE — Patient Instructions (Signed)
Explained breast self awareness with [name]. Patient did not need a Pap smear today due to patient has a history of a hysterectomy for benign reasons. Let patient know that she doesn't need any further Pap smears due to her history of a hysterectomy for benign reasons. Referred patient to the [facility] for a screening mammogram. Appointment scheduled Wednesday, August 08, 2021 at 1400. Patient aware of appointment and will be there. Let patient know Delford Field will follow up with her within the next couple weeks with results of mammogram by letter or phone. Kimberly Vasquez verbalized understanding.  [name], [name], RN 1:05 PM

## 2021-08-08 NOTE — Progress Notes (Signed)
[name] is a 61 y.o. female who presents to [facility] clinic today with no complaints.    Pap Smear: Pap smear not completed today. Last Pap smear was 4-10 years ago at [facility] clinic and was normal per patient. Per patient has no history of an abnormal Pap smear. Patient has a history of a hysterectomy in 2001 due to AUB. Patient doesn't need any further Pap smears due to her history of a hysterectomy for benign reasons per BCCCP and ASCCP guidelines. Last Pap smear result is not available in Epic.   Physical exam: Breasts Right breast slightly larger than left breast that per patient is normal for her. No skin abnormalities bilateral breasts. No nipple retraction bilateral breasts. No nipple discharge bilateral breasts. No lymphadenopathy. No lumps palpated bilateral breasts. No complaints of pain or tenderness on exam.  MM Breast Surgical Specimen  Result Date: 02/02/2020 CLINICAL DATA:  Status post radiofrequency device localized right breast lumpectomy. EXAM: SPECIMEN RADIOGRAPH OF THE RIGHT BREAST COMPARISON:  Previous exam(s). FINDINGS: Status post excision of the right breast. The radiofrequency tag is present within the specimen. Previously placed ribbon shaped clip is not identified within either specimen. As noted in the dictation for the post clip films, there was approximately 1.5 cm inferior migration of the clip relative to the targeted distortion. IMPRESSION: Specimen radiograph of the right breast. Electronically Signed   By: Kristopher Oppenheim M.D.   On: 02/02/2020 13:33   MM RT RADIO FREQUENCY TAG LOC MAMMO GUIDE  Result Date: 01/26/2020 CLINICAL DATA:  61 year old female with right breast distortion and biopsy-proven atypia. EXAM: MAMMOGRAPHIC GUIDED RADIOFREQUENCY DEVICE LOCALIZATION OF THE RIGHT BREAST COMPARISON:  Previous exam(s) FINDINGS: Patient presents for radiofrequency device localization prior to surgical excision of the right breast. I met with the patient  and we discussed the procedure of radiofrequency device localization including benefits and alternatives. We discussed the high likelihood of a successful procedure. We discussed the risks of the procedure including infection, bleeding, tissue injury and further surgery. Informed, written consent was given. The usual time-out protocol was performed immediately prior to the procedure. Using mammographic guidance, sterile technique, 1% lidocaine as local anesthesia, a radiofrequency tag was used to localize the right breast distortion using a inferior approach. The follow-up mammogram images confirm that the RF device is in the expected location and are marked for Dr. Windell Moment. Of note, due to the far posterior location of the distortion, the RF tag was placed approximately 1 cm directly anterior to the target. The post biopsy clip is migrated approximately 1 cm inferior to the site of distortion. Follow-up survey of the patient confirms the presence of the RF device. The patient tolerated the procedure well and was [facility]. IMPRESSION: Radiofrequency device localization of the RIGHT breast. No apparent complications. Please note, due to the far posterior location of the distortion, the RF tag was placed 1 cm directly anterior to the target. The post biopsy clip is migrated approximately 1 cm inferior to the site of distortion. Electronically Signed   By: Kristopher Oppenheim M.D.   On: 01/26/2020 16:27   MM RT BREAST BX W LOC DEV 1ST LESION IMAGE BX SPEC STEREO GUIDE  Addendum Date: 01/12/2020   ADDENDUM REPORT: 01/11/2020 12:12 ADDENDUM: PATHOLOGY revealed: A. RIGHT BREAST, CENTRAL POSTERIOR; STEREOTACTIC BIOPSY: - MINUTE FOCUS OF ATYPICAL GLANDULAR PROLIFERATION; SEE COMMENT. - FIBROCYSTIC AND FIBROADENOMATOID CHANGES WITH USUAL DUCTAL HYPERPLASIA. Comment: A small focus of atypical glands is noted in  close association with a nerve. The background breast tissue is quite  fibrotic/sclerotic, hindering evaluation of architecture. Immunohistochemical stains for myoepithelial markers were attempted, but unfortunately the focus of atypical glands disappears on deeper levels. Malignancy cannot be excluded. If clinical concern persists, additional tissue sampling may be helpful. Pathology results are CONCORDANT with imaging findings, per Dr. Dorise Bullion with excision recommended. Pathology results and recommendations below were discussed with patient by telephone on 01/11/2020. Patient reported biopsy site within normal limits with slight tenderness at the site. Post biopsy care instructions were reviewed, questions were answered and my direct phone number was provided to patient. Patient was instructed to call [facility] if any concerns or questions arise related to the biopsy. Recommend surgical consultation: Request for surgical consultation relayed to Al Pimple RN and Tanya Nones RN at [facility] (and Deneice Hollie Salk RT at [facility]) by Electa Sniff RN on 01/11/2020. Pathology results reported by Electa Sniff RN on 01/11/2020. Electronically Signed   By: Dorise Bullion III M.D   On: 01/11/2020 12:12   Result Date: 01/12/2020 CLINICAL DATA:  Biopsy of right breast distortion EXAM: RIGHT BREAST STEREOTACTIC CORE NEEDLE BIOPSY COMPARISON:  Previous exams. FINDINGS: The patient and I discussed the procedure of stereotactic-guided biopsy including benefits and alternatives. We discussed the high likelihood of a successful procedure. We discussed the risks of the procedure including infection, bleeding, tissue injury, clip migration, and inadequate sampling. Informed written consent was given. The usual time out protocol was performed immediately prior to the procedure. Using sterile technique and 1% Lidocaine as local anesthetic, under stereotactic guidance, a 9 gauge vacuum assisted device was used to perform core needle biopsy of  distortion in the posterior central right breast using a superior approach. Lesion quadrant: Posterior central right breast At the conclusion of the procedure, ribbon shaped tissue marker clip was deployed into the biopsy cavity. Follow-up 2-view mammogram was performed and dictated separately. IMPRESSION: Stereotactic-guided biopsy of distortion in the posterior central right breast. No apparent complications. Electronically Signed: By: Dorise Bullion III M.D On: 01/06/2020 08:45   MS DIGITAL DIAG UNI RIGHT  Result Date: 01/06/2020 CLINICAL DATA:  Evaluate biopsy marker after stereotactic biopsy of distortion. EXAM: 3D DIAGNOSTIC RIGHT MAMMOGRAM POST STEREOTACTIC BIOPSY COMPARISON:  Previous exam(s). FINDINGS: 3D Mammographic images were obtained following stereotactic guided biopsy of right breast distortion. The ribbon shaped clip migrated 1.3 cm inferior to the distortion. IMPRESSION: The ribbon shaped clip migrated 1.3 cm inferior to the distortion. Final Assessment: Post Procedure Mammograms for Marker Placement Electronically Signed   By: Dorise Bullion III M.D   On: 01/06/2020 08:53   MS DIGITAL DIAG TOMO UNI RIGHT  Result Date: 12/29/2019 CLINICAL DATA:  Callback for distortion EXAM: DIGITAL DIAGNOSTIC UNILATERAL RIGHT MAMMOGRAM WITH TOMO AND CAD; ULTRASOUND RIGHT BREAST LIMITED COMPARISON:  Previous exam(s). ACR Breast Density Category b: There are scattered areas of fibroglandular density. FINDINGS: Spot compression tomosynthesis views confirm persistence of architectural distortion within the central breast. It is best seen on CC spot tomo slice 47. There is a likely correlate on MLO spot slice 56. There is a possible correlate seen within the lower posterior RIGHT breast on ML slice 30. There is a less likely questioned correlate within the central posterior breast on ML slice 35. Mammographic images were processed with CAD. On physical exam, there is a superficial mole/papule in the  inferior breast centrally. Targeted ultrasound was performed of the lower breast. No definitive sonographic correlate  is identified. Given second questioned ML correlate, targeted ultrasound was performed of the upper outer breast and central breast. No definitive sonographic correlate is identified. No suspicious RIGHT axillary lymph nodes are seen. IMPRESSION: 1. There is architectural distortion within the central posterior RIGHT breast, best appreciated on CC imaging. Recommend stereotactic guided biopsy for additional evaluation. 2.  No suspicious RIGHT axillary lymph nodes. RECOMMENDATION: Stereotactic guided biopsy of the RIGHT breast x1. I have discussed the findings and recommendations with the patient. If applicable, a reminder letter will be sent to the patient regarding the next appointment. Patient will be scheduled for biopsy at her earliest convenience by the schedulers and ordering provider will be notified. BI-RADS CATEGORY  4: Suspicious. Electronically Signed   By: Valentino Saxon MD   On: 12/29/2019 12:00   MS DIGITAL SCREENING TOMO BILATERAL  Result Date: 12/17/2019 CLINICAL DATA:  Screening. Previous right breast surgical excision in 1993. EXAM: DIGITAL SCREENING BILATERAL MAMMOGRAM WITH TOMO AND CAD COMPARISON:  Previous exam(s). ACR Breast Density Category b: There are scattered areas of fibroglandular density. FINDINGS: In the right breast, possible distortion in the posterior central aspect of the breast in the craniocaudal projection warrants further evaluation. In the left breast, no findings suspicious for malignancy. Images were processed with CAD. IMPRESSION: Further evaluation is suggested for possible distortion in the right breast. RECOMMENDATION: Diagnostic mammogram and possibly ultrasound of the right breast. (Code:FI-R-51M) The patient will be contacted regarding the findings, and additional imaging will be scheduled. BI-RADS CATEGORY  0: Incomplete. Need additional  imaging evaluation and/or prior mammograms for comparison. Electronically Signed   By: Claudie Revering M.D.   On: 12/17/2019 14:47   MS DIGITAL SCREENING TOMO BILATERAL  Result Date: 10/27/2018 CLINICAL DATA:  Screening. EXAM: DIGITAL SCREENING BILATERAL MAMMOGRAM WITH TOMO AND CAD COMPARISON:  Previous exam(s). ACR Breast Density Category b: There are scattered areas of fibroglandular density. FINDINGS: There are no findings suspicious for malignancy. Images were processed with CAD. IMPRESSION: No mammographic evidence of malignancy. A result letter of this screening mammogram will be mailed directly to the patient. RECOMMENDATION: Screening mammogram in one year. (Code:SM-B-01Y) BI-RADS CATEGORY  1: Negative. Electronically Signed   By: Everlean Alstrom M.D.   On: 10/27/2018 16:43   MS DIGITAL SCREENING TOMO BILATERAL  Result Date: 08/25/2017 CLINICAL DATA:  Screening. EXAM: DIGITAL SCREENING BILATERAL MAMMOGRAM WITH TOMO AND CAD COMPARISON:  Previous exam(s). ACR Breast Density Category b: There are scattered areas of fibroglandular density. FINDINGS: There are no findings suspicious for malignancy. Images were processed with CAD. IMPRESSION: No mammographic evidence of malignancy. A result letter of this screening mammogram will be mailed directly to the patient. RECOMMENDATION: Screening mammogram in one year. (Code:SM-B-01Y) BI-RADS CATEGORY  1: Negative. Electronically Signed   By: Lillia Mountain M.D.   On: 08/25/2017 10:54        Pelvic/Bimanual Pap is not indicated today per BCCCP guidelines.   Smoking History: Patient is a former smoker that quit in 2000.   Patient Navigation: Patient education provided. Access to services provided for patient through [facility] program.   Colorectal Cancer Screening: Per patient has never had colonoscopy completed. FIT Test given to patient to complete. No complaints today.    Breast and Cervical Cancer Risk Assessment: Patient has family history of her paternal  grandmother having breast cancer. Patient has no known genetic mutations or history of radiation treatment to the chest before age 24. Patient does not have history of cervical dysplasia, immunocompromised, or DES  exposure in-utero.  Risk Assessment     Risk Scores       08/08/2021 11/23/2019   Last edited by: Demetrius Revel, LPN Theodore Demark, RN   5-year risk: 2.3 % 2.2 %   Lifetime risk: 10 % 10.3 %            A: BCCCP exam without pap smear No complaints.  P: Referred patient to the [facility] for a screening mammogram. Appointment scheduled Wednesday, August 08, 2021 at 1400.  Loletta Parish, RN 08/08/2021 1:05 PM

## 2021-09-20 LAB — FECAL OCCULT BLOOD, IMMUNOCHEMICAL

## 2021-09-20 NOTE — Progress Notes (Signed)
Please call patient with FIT Test result.

## 2021-09-27 ENCOUNTER — Telehealth: Payer: Self-pay

## 2021-09-27 NOTE — Telephone Encounter (Addendum)
Patient returned call, was informed, due to delay in transport, test was not able to be performed at lab. Patient requested new stool kit to be mailed to her, plans to take directly to lab.FIT test mailed.    Attempted to contact patient regarding FIT test. Left message on voicemail requesting a return call.

## 2021-10-09 ENCOUNTER — Other Ambulatory Visit: Payer: Self-pay

## 2021-10-09 DIAGNOSIS — Z1211 Encounter for screening for malignant neoplasm of colon: Secondary | ICD-10-CM

## 2021-12-25 ENCOUNTER — Telehealth: Payer: Self-pay

## 2021-12-25 NOTE — Telephone Encounter (Signed)
Patient called back and will bring SD card

## 2021-12-25 NOTE — Telephone Encounter (Signed)
Lm for patient to request that she bring SD card to 12/26/2021 visit.  

## 2021-12-25 NOTE — Telephone Encounter (Signed)
Noted.  Will close encounter.  

## 2021-12-26 ENCOUNTER — Encounter: Payer: Self-pay | Admitting: Internal Medicine

## 2021-12-26 ENCOUNTER — Ambulatory Visit (INDEPENDENT_AMBULATORY_CARE_PROVIDER_SITE_OTHER): Payer: Self-pay | Admitting: Internal Medicine

## 2021-12-26 ENCOUNTER — Ambulatory Visit
Admission: RE | Admit: 2021-12-26 | Discharge: 2021-12-26 | Disposition: A | Discharge status: Home / Self Care | Payer: Self-pay | Source: Ambulatory Visit | Attending: Internal Medicine | Admitting: Internal Medicine

## 2021-12-26 VITALS — BP 128/68 | HR 82 | Temp 98.1°F | Ht 67.5 in | Wt 284.8 lb

## 2021-12-26 DIAGNOSIS — R918 Other nonspecific abnormal finding of lung field: Secondary | ICD-10-CM | POA: Insufficient documentation

## 2021-12-26 DIAGNOSIS — G4733 Obstructive sleep apnea (adult) (pediatric): Secondary | ICD-10-CM

## 2021-12-26 DIAGNOSIS — J4520 Mild intermittent asthma, uncomplicated: Secondary | ICD-10-CM

## 2021-12-26 NOTE — Patient Instructions (Addendum)
Continue inhalers as prescribed Continue CPAP as prescribed CT CHEST TO ASSESS NODULE  GREAT JOB WITH WEIGHT LOSS!!

## 2021-12-26 NOTE — Progress Notes (Signed)
@Patient  ID: , female    DOB: Mar 08, 1960, 61 y.o.   MRN: [id number]   Referring provider: No ref. provider found  SYNOPSIS 61 year old female former smoker followed in our office for asthma, obstructive sleep apnea  PMH:.  Obesity, GERD Smoker/ Smoking History: Former smoker.  Quit 2000.  4-pack-year smoking Maintenance: Breo 100   CPAP compliance report 02/16/20-03/16/20: 30/30 day usage, 0.3 AHI. She wants 03/18/20 to assume responsibility for OSA not ENT.     Covid positive 10/21 - Covid positive soon after 2nd vaccination,booster due in March. Received MAB 11/22/19, states 24 hours after infusion sense of taste and smell returned.     CC Follow-up OSA Follow-up asthma Follow-up abnormal CT chest HPI   01/22/20 Stable at this time Completed antibiotics for sinus infection  No evidence of heart failure at this time No respiratory distress No fevers, chills, nausea, vomiting, diarrhea No evidence of lower extremity edema No evidence hemoptysis  On Breo 100 daily maintenance therapy and doing well On Albuterol and uses infrequently Mild intermittent asthma controlled  RE:OSA Previous download shows excellent compliance report AHI reduced to 0.3 Excellent compliance 100% for days 100% for greater than 4 hours  GERD under Control    RE-ABNORMAL CT CHEST Independently Reviewed By Me Today Very minuscule LLL nodule on CT chest Repeat CT chest pending  Tests:   FENO:  No results found for: "NITRICOXIDE"  PFT:    Latest Ref Rng & Units 08/15/2014    1:48 PM  PFT Results  FVC-Pre L 2.82  P  FVC-Predicted Pre % 73  P  FVC-Post L 2.87  P  FVC-Predicted Post % 74  P  Pre FEV1/FVC % % 77  P  Post FEV1/FCV % % 78  P  FEV1-Pre L 2.17  P  FEV1-Predicted Pre % 72  P  FEV1-Post L 2.22  P  DLCO uncorrected ml/min/mmHg 33.37  P  DLCO UNC% % 115  P  DLVA Predicted % 108  P    P Preliminary result    WALK:     08/15/2014    2:17 PM  SIX  MIN WALK  Medications Claritin, Advair @ 10am  Supplimental Oxygen during Test? (L/min) No  Laps 7  Partial Lap (in Meters) 9 meters  Baseline BP (sitting) 134/80  Baseline Heartrate 82  Baseline Dyspnea (Borg Scale) 2  Baseline Fatigue (Borg Scale) 2  Baseline SPO2 93 %  BP (sitting) 148/88  Heartrate 107  Dyspnea (Borg Scale) 3  Fatigue (Borg Scale) 3  SPO2 96 %  BP (sitting) 132/84  Heartrate 93  SPO2 96 %  Stopped or Paused before Six Minutes No  Distance Completed 345 meters  Tech Comments: Pt walked at moderate pace w/o complications.  Provider Comments: Noted, see clinic note      Specialty Problems       Pulmonary Problems   Extrinsic asthma    Pulmonary function testing 08/15/2014 FVC 73% FEV1 72% FEV1/FVC 77% FRC 41% RV 51% TLC 65% RV/TLC 76% Impression: Moderate obstruction with no stomach and broncho-dilator response. Severe to moderate decrease in RV and TLC's, suggesting a restrictive component, severe reduction in ERV, these findings together can be seen in abdominal/chest wall obesity. Clinical correlation is considered/advised. 6 minute walk test: 1131 feet/345 m, low saturation 93%, highest heart rate 107.      URI (upper respiratory infection)   Snoring   Asthma without status asthmaticus   OSA (obstructive sleep apnea)  Shortness of breath    Allergies  Allergen Reactions   Codeine Nausea And Vomiting    Can't take it in pill form    Immunization History  Administered Date(s) Administered   Influenza,inj,Quad PF,6+ Mos 12/08/2014, 12/09/2017   PFIZER(Purple Top)SARS-COV-2 Vaccination 10/26/2019, 11/16/2019    Past Medical History:  Diagnosis Date   Allergic rhinitis    Asthma without status asthmaticus    COVID-19 virus infection    a. 11/2019; b. 06/2020.   Diabetes mellitus without complication (HCC) 3149,7026   gestational   GERD (gastroesophageal reflux disease)    Hyperlipidemia    IGT (impaired glucose tolerance)     Morbid obesity (HCC)    Obesity    Osteopenia    Sleep apnea    Vertigo    Vitamin D deficiency     Tobacco History: Social History   Tobacco Use  Smoking Status Former   Packs/day: 1.00   Years: 8.00   Total pack years: 8.00   Types: Cigarettes   Quit date: 2000   Years since quitting: 23.8  Smokeless Tobacco Never  Tobacco Comments   quit smoking in 2000   Counseling given: Not Answered Tobacco comments: quit smoking in 2000   Continue to not smoke  Outpatient Encounter Medications as of 12/26/2021  Medication Sig   albuterol (PROVENTIL) (2.5 MG/3ML) 0.083% nebulizer solution Take 3 mLs (2.5 mg total) by nebulization every 6 (six) hours as needed for wheezing or shortness of breath.   albuterol (VENTOLIN HFA) 108 (90 Base) MCG/ACT inhaler Inhale 2 puffs into the lungs every 6 (six) hours as needed for wheezing or shortness of breath.   cetirizine (ZYRTEC) 10 MG tablet Take 10 mg by mouth daily.   fluticasone furoate-vilanterol (BREO ELLIPTA) 100-25 MCG/ACT AEPB Inhale 1 puff into the lungs daily.   omeprazole (PRILOSEC) 20 MG capsule Take 20 mg by mouth every evening.    triamcinolone (NASACORT) 55 MCG/ACT AERO nasal inhaler Place 2 sprays into the nose daily.   albuterol (VENTOLIN HFA) 108 (90 Base) MCG/ACT inhaler Inhale 1-2 puffs into the lungs every 6 (six) hours as needed for wheezing or shortness of breath.   diphenhydramine-acetaminophen (TYLENOL PM) 25-500 MG TABS tablet Take 1 tablet by mouth at bedtime.   guaiFENesin-codeine 100-10 MG/5ML syrup Take 5 mLs by mouth every 6 (six) hours as needed for cough.   promethazine-dextromethorphan (PROMETHAZINE-DM) 6.25-15 MG/5ML syrup Take 5 mLs by mouth 4 (four) times daily as needed for cough.   [DISCONTINUED] amoxicillin-clavulanate (AUGMENTIN) 875-125 MG tablet Take 1 tablet by mouth every 12 (twelve) hours.   [DISCONTINUED] azithromycin (ZITHROMAX) 250 MG tablet Take 1 tablet (250 mg total) by mouth daily. Take first 2  tablets together, then 1 every day until finished.   [DISCONTINUED] fluticasone furoate-vilanterol (BREO ELLIPTA) 100-25 MCG/INH AEPB Inhale 1 puff into the lungs daily.   [DISCONTINUED] meloxicam (MOBIC) 15 MG tablet Take 1 tablet (15 mg total) by mouth daily as needed for pain.   [DISCONTINUED] predniSONE (STERAPRED UNI-PAK 21 TAB) 10 MG (21) TBPK tablet Take by mouth daily. Take 6 tabs by mouth daily  for 2 days, then 5 tabs for 2 days, then 4 tabs for 2 days, then 3 tabs for 2 days, 2 tabs for 2 days, then 1 tab by mouth daily for 2 days   No facility-administered encounter medications on file as of 12/26/2021.     BP 128/68 (BP Location: Left Wrist, Cuff Size: Normal)   Pulse 82   Temp 98.1  F (36.7 C) (Rectal)   Ht 5' 7.5" (1.715 m)   Wt 284 lb 12.8 oz (129.2 kg)   LMP 07/18/2000 (Approximate)   SpO2 97%   BMI 43.95 kg/m       Review of Systems: Gen:  Denies  fever, sweats, chills weight loss  HEENT: Denies blurred vision, double vision, ear pain, eye pain, hearing loss, nose bleeds, sore throat Cardiac:  No dizziness, chest pain or heaviness, chest tightness,edema, No JVD Resp:   No cough, -sputum production, -shortness of breath,-wheezing, -hemoptysis,  Other:  All other systems negative    Physical Examination:   General Appearance: No distress  EYES PERRLA, EOM intact.   NECK Supple, No JVD Pulmonary: normal breath sounds, No wheezing.  CardiovascularNormal S1,S2.  No m/r/g.   Abdomen: Benign, Soft, non-tender. ALL OTHER ROS ARE NEGATIVE   Assessment & Plan:   61 year old pleasant white female seen today for follow-up mild intermittent asthma with underlying COPD in the setting of obesity and deconditioned state    Currently on antibiotics for sinus infection  Asthma seems to be under control  Avoid triggers  Continue Breo and albuterol as needed  Continue maintenance therapy  Obesity -recommend significant weight loss -recommend changing  diet  Deconditioned state -Recommend increased daily activity and exercise   OSA Compliance report reviewed with patient in detail  GERD Continue PPI      RE-ABNORMAL CT CHEST Independently Reviewed By Me Today March 2023  new 7 mm ground-glass nodule in the left lower lobe. Recommend a non-contrast Chest CT at 6-12 months to confirm persistence,   MEDICATION ADJUSTMENTS/LABS AND TESTS ORDERED: Continue inhalers as prescribed Recommend weight loss Continue CPAP as prescribed CT CHEST TO ASSESS NODULE    CURRENT MEDICATIONS REVIEWED AT LENGTH WITH PATIENT TODAY   Patient  satisfied with Plan of action and management. All questions answered  Follow up 1 year  Total Time Spent  35 mins   Treonna Klee Santiago Glad, M.D.  [facility]  Medical Director [facility] Medical Director [facility] Cardio-Pulmonary Department

## 2022-01-01 ENCOUNTER — Telehealth: Payer: Self-pay | Admitting: Internal Medicine

## 2022-01-01 NOTE — Telephone Encounter (Signed)
Lm x1 for patient.  

## 2022-01-01 NOTE — Telephone Encounter (Signed)
Patient is aware of below message/recommendations.  She stated that PCP is not within the [facility]. I recommended that she contact [facility] radiology department and request a disc to take to PCP. Number provided for radiology department. She voiced her understanding and had no further questions.  Nothing further needed.

## 2022-01-01 NOTE — Telephone Encounter (Signed)
Called and spoke to patient.  Patient stated that she reviewed CT chest and it notes a 30mm mass in left breast. She would like to know next steps.   Dr. Belia Heman, please advise. Thanks

## 2022-01-07 ENCOUNTER — Encounter: Payer: Self-pay | Admitting: Obstetrics and Gynecology

## 2022-01-07 ENCOUNTER — Other Ambulatory Visit: Payer: Self-pay

## 2022-01-07 DIAGNOSIS — N6321 Unspecified lump in the left breast, upper outer quadrant: Secondary | ICD-10-CM

## 2022-01-07 NOTE — Addendum Note (Signed)
Addended by: Narda Rutherford on: 01/07/2022 03:07 PM   Modules accepted: Orders

## 2022-01-08 ENCOUNTER — Ambulatory Visit: Payer: Self-pay | Admitting: Internal Medicine

## 2022-01-28 ENCOUNTER — Ambulatory Visit
Admission: RE | Admit: 2022-01-28 | Discharge: 2022-01-28 | Disposition: A | Discharge status: Home / Self Care | Payer: Self-pay | Source: Ambulatory Visit | Attending: Obstetrics and Gynecology | Admitting: Obstetrics and Gynecology

## 2022-01-28 ENCOUNTER — Other Ambulatory Visit: Payer: Self-pay | Admitting: Obstetrics and Gynecology

## 2022-01-28 DIAGNOSIS — N6321 Unspecified lump in the left breast, upper outer quadrant: Secondary | ICD-10-CM

## 2022-01-28 DIAGNOSIS — Z1231 Encounter for screening mammogram for malignant neoplasm of breast: Secondary | ICD-10-CM

## 2022-02-10 ENCOUNTER — Ambulatory Visit
Admission: EM | Admit: 2022-02-10 | Discharge: 2022-02-10 | Disposition: A | Discharge status: Home / Self Care | Payer: Self-pay | Attending: Emergency Medicine | Admitting: Emergency Medicine

## 2022-02-10 ENCOUNTER — Ambulatory Visit (INDEPENDENT_AMBULATORY_CARE_PROVIDER_SITE_OTHER): Payer: Self-pay

## 2022-02-10 DIAGNOSIS — R051 Acute cough: Secondary | ICD-10-CM

## 2022-02-10 DIAGNOSIS — U071 COVID-19: Secondary | ICD-10-CM

## 2022-02-10 NOTE — Discharge Instructions (Signed)
You were seen for cough and are being treated for the same.   -Chest x-ray did not show pneumonia. -Treat your cough with over-the-counter medications such as Mucinex DM. -Use your rescue inhaler if needed. -If your symptoms worsen or do not improve, follow-up for repeat evaluation.  Take care, Dr. Sharlet Salina, NP-c

## 2022-02-10 NOTE — ED Provider Notes (Signed)
[facility] [place] [facility] [place]   Name: Kimberly Vasquez DOB: May 21, 1960 MRN: [id number] CSN: [id number] PCP: Patient, No Pcp Per  Arrival date and time:  02/10/22 1006  Chief Complaint:  Nasal Congestion (Home tested positive forCovid on Friday. Need to be checkedForChest congestion. - Entered by patient) and Cough   NOTE: Prior to seeing the patient today, I have reviewed the triage nursing documentation and vital signs. Clinical staff has updated patient's PMH/PSHx, current medication list, and drug allergies/intolerances to ensure comprehensive history available to assist in medical decision making.   History:   HPI: Kimberly Vasquez is a 61 y.o. female who presents today with complaints of cough in the setting of positive COVID test.  Patient tested for COVID at home on Friday after exhibiting symptoms of a head cold.  Her at home COVID tests resulted as positive.  She felt generally well yesterday, but last night she started noticing a cough.  The cough is nonproductive overall, but due to patient's history of asthma and other high risk comorbidities, she she presented for further evaluation.  She denies fevers, chills or bodyaches.  No changes in appetite.  No recent use of her rescue inhaler or albuterol nebulizer.   Past Medical History:  Diagnosis Date   Allergic rhinitis    Asthma without status asthmaticus    COVID-19 virus infection    a. 11/2019; b. 06/2020.   Diabetes mellitus without complication (HCC) 4982,6415   gestational   GERD (gastroesophageal reflux disease)    Hyperlipidemia    IGT (impaired glucose tolerance)    Morbid obesity (HCC)    Obesity    Osteopenia    Sleep apnea    Vertigo    Vitamin D deficiency     Past Surgical History:  Procedure Laterality Date   ABDOMINAL HYSTERECTOMY  2001   BREAST BIOPSY Right 01/06/2020   stereo bx, ribbon clip, ATYPICAL GLANDULAR PROLIFERATION   BREAST BIOPSY WITH RADIO FREQUENCY LOCALIZER Right  02/02/2020   Procedure: BREAST BIOPSY WITH RADIO FREQUENCY LOCALIZER;  Surgeon: Carolan Shiver, MD;  Location: [facility] ORS;  Service: General;  Laterality: Right;   BREAST CYST ASPIRATION Left    BREAST EXCISIONAL BIOPSY Right 1993   benign, unable to see scar    BREAST EXCISIONAL BIOPSY Left 1979   benign   CHOLECYSTECTOMY     ESOPHAGOGASTRODUODENOSCOPY (EGD) WITH PROPOFOL N/A 01/02/2016   Procedure: ESOPHAGOGASTRODUODENOSCOPY (EGD) WITH PROPOFOL;  Surgeon: Midge Minium, MD;  Location: [facility] ENDOSCOPY;  Service: Endoscopy;  Laterality: N/A;    Family History  Problem Relation Age of Onset   Heart disease Mother    Asthma Mother    Diabetes Mother    Stroke Father    Breast cancer Paternal Grandmother 58    Social History   Tobacco Use   Smoking status: Former    Packs/day: 1.00    Years: 8.00    Total pack years: 8.00    Types: Cigarettes    Quit date: 2000    Years since quitting: 23.9   Smokeless tobacco: Never   Tobacco comments:    quit smoking in 2000  Vaping Use   Vaping Use: Never used  Substance Use Topics   Alcohol use: Yes    Alcohol/week: 0.0 standard drinks of alcohol    Comment: occasional   Drug use: No    Patient Active Problem List   Diagnosis Date Noted   Diabetes mellitus without complication (HCC)    Obesity  COVID-19 virus infection 06/2020   OSA (obstructive sleep apnea) 03/17/2020   Shortness of breath 03/17/2020   History of COVID-19 03/17/2020   Asthma without status asthmaticus 11/16/2015   IGT (impaired glucose tolerance) 11/16/2015   Gastroesophageal reflux disease 11/14/2015   Snoring 11/14/2015   URI (upper respiratory infection) 04/19/2015   Morbid obesity (HCC) 08/15/2014   Extrinsic asthma 06/14/2014    Home Medications:    No outpatient medications have been marked as taking for the 02/10/22 encounter [facility] Encounter).    Allergies:   Codeine  Review of Systems (ROS): Review of Systems  Constitutional:   Negative for activity change, appetite change, chills, diaphoresis and fatigue.  HENT:  Positive for postnasal drip. Negative for congestion, sinus pain, sneezing and sore throat.   Respiratory:  Positive for cough. Negative for shortness of breath and wheezing.   Cardiovascular:  Negative for chest pain.  Gastrointestinal:  Negative for diarrhea, nausea and vomiting.  Neurological:  Positive for headaches.  All other systems reviewed and are negative.    Vital Signs: Today's Vitals   02/10/22 1220 02/10/22 1221  BP: (!) 161/85   Pulse: 84   Resp: 16   Temp: 98.3 F (36.8 C)   TempSrc: Oral   SpO2: 95%   PainSc:  0-No pain    Physical Exam: Physical Exam Vitals and nursing note reviewed.  Constitutional:      Appearance: Normal appearance.  HENT:     Right Ear: Tympanic membrane is erythematous.     Left Ear: Tympanic membrane normal.     Ears:     Comments: Mild erythema noted to right TM    Nose:     Right Sinus: Maxillary sinus tenderness and frontal sinus tenderness present.     Left Sinus: Maxillary sinus tenderness and frontal sinus tenderness present.     Mouth/Throat:     Pharynx: Oropharynx is clear. No posterior oropharyngeal erythema.     Tonsils: No tonsillar exudate.  Cardiovascular:     Rate and Rhythm: Normal rate and regular rhythm.     Pulses: Normal pulses.     Heart sounds: Normal heart sounds.  Pulmonary:     Effort: Pulmonary effort is normal.     Breath sounds: Examination of the right-lower field reveals rales. Rales present.  Skin:    General: Skin is warm and dry.  Neurological:     General: No focal deficit present.     Mental Status: She is alert and oriented to person, place, and time.  Psychiatric:        Mood and Affect: Mood normal.        Behavior: Behavior normal.      Urgent Care Treatments / Results:   LABS: PLEASE NOTE: all labs that were ordered this encounter are listed, however only abnormal results are  displayed. Labs Reviewed - No data to display  EKG: -None  RADIOLOGY: DG Chest 2 View  Result Date: 02/10/2022 CLINICAL DATA:  cough; rhonci; asthma; COVID EXAM: CHEST - 2 VIEW COMPARISON:  Jul 01, 2020, December 26, 2021 FINDINGS: The cardiomediastinal silhouette is unchanged in contour. No pleural effusion. No pneumothorax. No acute pleuroparenchymal abnormality. Visualized abdomen is unremarkable. Mild multilevel degenerative changes of the thoracic spine. IMPRESSION: No acute cardiopulmonary abnormality. Electronically Signed   By: Meda Klinefelter M.D.   On: 02/10/2022 13:07    PROCEDURES: Procedures  MEDICATIONS RECEIVED THIS VISIT: Medications - No data to display  PERTINENT CLINICAL COURSE NOTES/UPDATES:   Initial  Impression / Assessment and Plan / Urgent Care Course:  Pertinent labs & imaging results that were available during my care of the patient were personally reviewed by me and considered in my medical decision making (see lab/imaging section of note for values and interpretations).  Kimberly Vasquez is a 61 y.o. female who presents to [facility] today with complaints of cough in the setting of Covid-19, diagnosed with the same, and treated as such with the directions below. NP and patient reviewed discharge instructions below during visit.   Patient is well appearing overall in clinic today. She does not appear to be in any acute distress. Presenting symptoms (see HPI) and exam as documented above.   I have reviewed the follow up and strict return precautions for any new or worsening symptoms. Patient is aware of symptoms that would be deemed urgent/emergent, and would thus require further evaluation either here or in the emergency department. At the time of discharge, she verbalized understanding and consent with the discharge plan as it was reviewed with her. All questions were fielded by provider and/or clinic staff prior to patient discharge.    Final  Clinical Impressions / Urgent Care Diagnoses:   Final diagnoses:  COVID-19  Acute cough    New Prescriptions:  [place] Controlled Substance Registry consulted? Not Applicable  No orders of the defined types were placed in this encounter.     Discharge Instructions      You were seen for cough and are being treated for the same.   -Chest x-ray did not show pneumonia. -Treat your cough with over-the-counter medications such as Mucinex DM. -Use your rescue inhaler if needed. -If your symptoms worsen or do not improve, follow-up for repeat evaluation.  Take care, Dr. Sharlet Salina, NP-c     Recommended Follow up Care:  Patient encouraged to follow up with the following provider within the specified time frame, or sooner as dictated by the severity of her symptoms. As always, she was instructed that for any urgent/emergent care needs, she should seek care either here or in the emergency department for more immediate evaluation.   Bailey Mech, DNP, NP-c   Bailey Mech, NP 02/10/22 1326

## 2022-02-10 NOTE — ED Triage Notes (Signed)
Patient tested positive Friday for COVID at home. States she has been coughing excessively and with her hx of asthma wants to make sure there is nothing in her chest.

## 2022-03-13 ENCOUNTER — Telehealth: Payer: Self-pay | Admitting: Internal Medicine

## 2022-03-13 NOTE — Telephone Encounter (Addendum)
Paper work has been placed in Dr. Zoila Shutter folder for signature.

## 2022-03-20 MED ORDER — FLUTICASONE FUROATE-VILANTEROL 100-25 MCG/ACT IN AEPB: 1.0000 | INHALATION_SPRAY | Freq: Every day | RESPIRATORY_TRACT | 11 refills | Status: DC

## 2022-03-20 NOTE — Telephone Encounter (Signed)
Forms and Rx has been mailed to [facility].  Patient is aware and voiced her understanding.  Nothing further needed.

## 2022-03-27 MED ORDER — FLUTICASONE FUROATE-VILANTEROL 100-25 MCG/ACT IN AEPB: 1.0000 | INHALATION_SPRAY | Freq: Every day | RESPIRATORY_TRACT | 3 refills | Status: DC

## 2022-03-27 NOTE — Addendum Note (Signed)
Addended by: Claudette Head A on: 03/27/2022 11:09 AM   Modules accepted: Orders

## 2022-04-01 ENCOUNTER — Telehealth: Payer: Self-pay | Admitting: Internal Medicine

## 2022-04-01 MED ORDER — FLUTICASONE FUROATE-VILANTEROL 100-25 MCG/ACT IN AEPB: 1.0000 | INHALATION_SPRAY | Freq: Every day | RESPIRATORY_TRACT | 3 refills | Status: DC

## 2022-04-01 NOTE — Telephone Encounter (Signed)
Kimberly Vasquez with [facility] is aware that Rx is awaiting Dr. Zoila Shutter signature. Once signed, Rx will be faxed to [place].

## 2022-04-03 NOTE — Telephone Encounter (Signed)
Rx signed and and faxed to [place]. Received successful fax confirmation.

## 2022-07-04 ENCOUNTER — Other Ambulatory Visit: Payer: Self-pay

## 2022-07-04 DIAGNOSIS — Z1231 Encounter for screening mammogram for malignant neoplasm of breast: Secondary | ICD-10-CM

## 2022-08-12 ENCOUNTER — Ambulatory Visit: Payer: Self-pay | Attending: Hematology and Oncology | Admitting: Hematology and Oncology

## 2022-08-12 ENCOUNTER — Other Ambulatory Visit: Payer: Self-pay

## 2022-08-12 ENCOUNTER — Ambulatory Visit
Admission: RE | Admit: 2022-08-12 | Discharge: 2022-08-12 | Disposition: A | Discharge status: Home / Self Care | Payer: Self-pay | Source: Ambulatory Visit | Attending: Obstetrics and Gynecology | Admitting: Obstetrics and Gynecology

## 2022-08-12 VITALS — BP 146/57 | Wt 302.7 lb

## 2022-08-12 DIAGNOSIS — Z1211 Encounter for screening for malignant neoplasm of colon: Secondary | ICD-10-CM

## 2022-08-12 DIAGNOSIS — Z1231 Encounter for screening mammogram for malignant neoplasm of breast: Secondary | ICD-10-CM

## 2022-08-12 NOTE — Patient Instructions (Signed)
Taught Kimberly Vasquez about self breast awareness and gave educational materials to take home. Patient did not need a Pap smear today due to benign hysterectomy. Referred patient to the [facility] for screening mammogram. Appointment scheduled for 08/12/22. Patient aware of appointment and will be there. Let patient know will follow up with her within the next couple weeks with results. Arshia Spellman verbalized understanding.  Pascal Lux, NP 10:37 AM

## 2022-08-12 NOTE — Progress Notes (Signed)
[name] is a 62 y.o. female who presents to [facility] clinic today with no complaints.    Pap Smear: Pap not smear completed today due to benign hysterectomy.     Physical exam: Breasts Breasts symmetrical. No skin abnormalities bilateral breasts. No nipple retraction bilateral breasts. No nipple discharge bilateral breasts. No lymphadenopathy. No lumps palpated bilateral breasts.  MS DIGITAL DIAG TOMO UNI LEFT  Result Date: 01/28/2022 CLINICAL DATA:  The mm mass in the upper-outer quadrant of the left breast on a recent chest CT. EXAM: DIGITAL DIAGNOSTIC UNILATERAL LEFT MAMMOGRAM WITH TOMOSYNTHESIS TECHNIQUE: Left digital diagnostic mammography and breast tomosynthesis was performed. COMPARISON:  Previous examinations, including the chest CT dated 12/26/2021 and screening mammogram dated 08/08/2021. ACR Breast Density Category b: There are scattered areas of fibroglandular density. FINDINGS: There are multiple small, rounded, circumscribed masses in the anterior left breast, including the mass seen on the chest CT. These have been mammographically stable for many years, compatible with benign masses. No findings suspicious for malignancy. IMPRESSION: 1. The recently demonstrated left breast mass is a mammographically stable benign mass. 2. No evidence of malignancy. RECOMMENDATION: Bilateral screening mammogram in June 2024 when due. I have discussed the findings and recommendations with the patient. If applicable, a reminder letter will be sent to the patient regarding the next appointment. BI-RADS CATEGORY  2: Benign. Electronically Signed   By: Beckie Salts M.D.   On: 01/28/2022 10:13  MS DIGITAL SCREENING TOMO BILATERAL  Result Date: 08/09/2021 CLINICAL DATA:  Screening. EXAM: DIGITAL SCREENING BILATERAL MAMMOGRAM WITH TOMOSYNTHESIS AND CAD TECHNIQUE: Bilateral screening digital craniocaudal and mediolateral oblique mammograms were obtained. Bilateral screening digital breast  tomosynthesis was performed. The images were evaluated with computer-aided detection. COMPARISON:  Previous exam(s). ACR Breast Density Category b: There are scattered areas of fibroglandular density. FINDINGS: There are no findings suspicious for malignancy. IMPRESSION: No mammographic evidence of malignancy. A result letter of this screening mammogram will be mailed directly to the patient. RECOMMENDATION: Screening mammogram in one year. (Code:SM-B-01Y) BI-RADS CATEGORY  1: Negative. Electronically Signed   By: Hulan Saas M.D.   On: 08/09/2021 14:27   MM Breast Surgical Specimen  Result Date: 02/02/2020 CLINICAL DATA:  Status post radiofrequency device localized right breast lumpectomy. EXAM: SPECIMEN RADIOGRAPH OF THE RIGHT BREAST COMPARISON:  Previous exam(s). FINDINGS: Status post excision of the right breast. The radiofrequency tag is present within the specimen. Previously placed ribbon shaped clip is not identified within either specimen. As noted in the dictation for the post clip films, there was approximately 1.5 cm inferior migration of the clip relative to the targeted distortion. IMPRESSION: Specimen radiograph of the right breast. Electronically Signed   By: Sande Brothers M.D.   On: 02/02/2020 13:33   MM RT RADIO FREQUENCY TAG LOC MAMMO GUIDE  Result Date: 01/26/2020 CLINICAL DATA:  62 year old female with right breast distortion and biopsy-proven atypia. EXAM: MAMMOGRAPHIC GUIDED RADIOFREQUENCY DEVICE LOCALIZATION OF THE RIGHT BREAST COMPARISON:  Previous exam(s) FINDINGS: Patient presents for radiofrequency device localization prior to surgical excision of the right breast. I met with the patient and we discussed the procedure of radiofrequency device localization including benefits and alternatives. We discussed the high likelihood of a successful procedure. We discussed the risks of the procedure including infection, bleeding, tissue injury and further surgery. Informed, written  consent was given. The usual time-out protocol was performed immediately prior to the procedure. Using mammographic guidance, sterile technique, 1% lidocaine as local anesthesia, a radiofrequency tag was  used to localize the right breast distortion using a inferior approach. The follow-up mammogram images confirm that the RF device is in the expected location and are marked for Dr. Hazle Quant. Of note, due to the far posterior location of the distortion, the RF tag was placed approximately 1 cm directly anterior to the target. The post biopsy clip is migrated approximately 1 cm inferior to the site of distortion. Follow-up survey of the patient confirms the presence of the RF device. The patient tolerated the procedure well and was released from the [facility]. IMPRESSION: Radiofrequency device localization of the RIGHT breast. No apparent complications. Please note, due to the far posterior location of the distortion, the RF tag was placed 1 cm directly anterior to the target. The post biopsy clip is migrated approximately 1 cm inferior to the site of distortion. Electronically Signed   By: Sande Brothers M.D.   On: 01/26/2020 16:27   MM RT BREAST BX W LOC DEV 1ST LESION IMAGE BX SPEC STEREO GUIDE  Addendum Date: 01/12/2020   ADDENDUM REPORT: 01/11/2020 12:12 ADDENDUM: PATHOLOGY revealed: A. RIGHT BREAST, CENTRAL POSTERIOR; STEREOTACTIC BIOPSY: - MINUTE FOCUS OF ATYPICAL GLANDULAR PROLIFERATION; SEE COMMENT. - FIBROCYSTIC AND FIBROADENOMATOID CHANGES WITH USUAL DUCTAL HYPERPLASIA. Comment: A small focus of atypical glands is noted in close association with a nerve. The background breast tissue is quite fibrotic/sclerotic, hindering evaluation of architecture. Immunohistochemical stains for myoepithelial markers were attempted, but unfortunately the focus of atypical glands disappears on deeper levels. Malignancy cannot be excluded. If clinical concern persists, additional tissue sampling may be helpful.  Pathology results are CONCORDANT with imaging findings, per Dr. Gerome Sam with excision recommended. Pathology results and recommendations below were discussed with patient by telephone on 01/11/2020. Patient reported biopsy site within normal limits with slight tenderness at the site. Post biopsy care instructions were reviewed, questions were answered and my direct phone number was provided to patient. Patient was instructed to call [facility] if any concerns or questions arise related to the biopsy. Recommend surgical consultation: Request for surgical consultation relayed to Coralee Rud RN and Molli Posey RN at [facility] (and Deneice Bryon Lions RT at [facility]) by Randa Lynn RN on 01/11/2020. Pathology results reported by Randa Lynn RN on 01/11/2020. Electronically Signed   By: Gerome Sam III M.D   On: 01/11/2020 12:12   Result Date: 01/12/2020 CLINICAL DATA:  Biopsy of right breast distortion EXAM: RIGHT BREAST STEREOTACTIC CORE NEEDLE BIOPSY COMPARISON:  Previous exams. FINDINGS: The patient and I discussed the procedure of stereotactic-guided biopsy including benefits and alternatives. We discussed the high likelihood of a successful procedure. We discussed the risks of the procedure including infection, bleeding, tissue injury, clip migration, and inadequate sampling. Informed written consent was given. The usual time out protocol was performed immediately prior to the procedure. Using sterile technique and 1% Lidocaine as local anesthetic, under stereotactic guidance, a 9 gauge vacuum assisted device was used to perform core needle biopsy of distortion in the posterior central right breast using a superior approach. Lesion quadrant: Posterior central right breast At the conclusion of the procedure, ribbon shaped tissue marker clip was deployed into the biopsy cavity. Follow-up 2-view mammogram was performed and dictated separately. IMPRESSION:  Stereotactic-guided biopsy of distortion in the posterior central right breast. No apparent complications. Electronically Signed: By: Gerome Sam III M.D On: 01/06/2020 08:45   MS DIGITAL DIAG UNI RIGHT  Result Date: 01/06/2020 CLINICAL DATA:  Evaluate biopsy marker after  stereotactic biopsy of distortion. EXAM: 3D DIAGNOSTIC RIGHT MAMMOGRAM POST STEREOTACTIC BIOPSY COMPARISON:  Previous exam(s). FINDINGS: 3D Mammographic images were obtained following stereotactic guided biopsy of right breast distortion. The ribbon shaped clip migrated 1.3 cm inferior to the distortion. IMPRESSION: The ribbon shaped clip migrated 1.3 cm inferior to the distortion. Final Assessment: Post Procedure Mammograms for Marker Placement Electronically Signed   By: Gerome Sam III M.D   On: 01/06/2020 08:53   MS DIGITAL DIAG TOMO UNI RIGHT  Result Date: 12/29/2019 CLINICAL DATA:  Callback for distortion EXAM: DIGITAL DIAGNOSTIC UNILATERAL RIGHT MAMMOGRAM WITH TOMO AND CAD; ULTRASOUND RIGHT BREAST LIMITED COMPARISON:  Previous exam(s). ACR Breast Density Category b: There are scattered areas of fibroglandular density. FINDINGS: Spot compression tomosynthesis views confirm persistence of architectural distortion within the central breast. It is best seen on CC spot tomo slice 47. There is a likely correlate on MLO spot slice 56. There is a possible correlate seen within the lower posterior RIGHT breast on ML slice 30. There is a less likely questioned correlate within the central posterior breast on ML slice 35. Mammographic images were processed with CAD. On physical exam, there is a superficial mole/papule in the inferior breast centrally. Targeted ultrasound was performed of the lower breast. No definitive sonographic correlate is identified. Given second questioned ML correlate, targeted ultrasound was performed of the upper outer breast and central breast. No definitive sonographic correlate is identified. No suspicious  RIGHT axillary lymph nodes are seen. IMPRESSION: 1. There is architectural distortion within the central posterior RIGHT breast, best appreciated on CC imaging. Recommend stereotactic guided biopsy for additional evaluation. 2.  No suspicious RIGHT axillary lymph nodes. RECOMMENDATION: Stereotactic guided biopsy of the RIGHT breast x1. I have discussed the findings and recommendations with the patient. If applicable, a reminder letter will be sent to the patient regarding the next appointment. Patient will be scheduled for biopsy at her earliest convenience by the schedulers and ordering provider will be notified. BI-RADS CATEGORY  4: Suspicious. Electronically Signed   By: Meda Klinefelter MD   On: 12/29/2019 12:00   MS DIGITAL SCREENING TOMO BILATERAL  Result Date: 12/17/2019 CLINICAL DATA:  Screening. Previous right breast surgical excision in 1993. EXAM: DIGITAL SCREENING BILATERAL MAMMOGRAM WITH TOMO AND CAD COMPARISON:  Previous exam(s). ACR Breast Density Category b: There are scattered areas of fibroglandular density. FINDINGS: In the right breast, possible distortion in the posterior central aspect of the breast in the craniocaudal projection warrants further evaluation. In the left breast, no findings suspicious for malignancy. Images were processed with CAD. IMPRESSION: Further evaluation is suggested for possible distortion in the right breast. RECOMMENDATION: Diagnostic mammogram and possibly ultrasound of the right breast. (Code:FI-R-7M) The patient will be contacted regarding the findings, and additional imaging will be scheduled. BI-RADS CATEGORY  0: Incomplete. Need additional imaging evaluation and/or prior mammograms for comparison. Electronically Signed   By: Beckie Salts M.D.   On: 12/17/2019 14:47   MS DIGITAL SCREENING TOMO BILATERAL  Result Date: 10/27/2018 CLINICAL DATA:  Screening. EXAM: DIGITAL SCREENING BILATERAL MAMMOGRAM WITH TOMO AND CAD COMPARISON:  Previous exam(s). ACR  Breast Density Category b: There are scattered areas of fibroglandular density. FINDINGS: There are no findings suspicious for malignancy. Images were processed with CAD. IMPRESSION: No mammographic evidence of malignancy. A result letter of this screening mammogram will be mailed directly to the patient. RECOMMENDATION: Screening mammogram in one year. (Code:SM-B-01Y) BI-RADS CATEGORY  1: Negative. Electronically Signed   By: Victorino Dike  Derinda Late M.D.   On: 10/27/2018 16:43   MS DIGITAL SCREENING TOMO BILATERAL  Result Date: 08/25/2017 CLINICAL DATA:  Screening. EXAM: DIGITAL SCREENING BILATERAL MAMMOGRAM WITH TOMO AND CAD COMPARISON:  Previous exam(s). ACR Breast Density Category b: There are scattered areas of fibroglandular density. FINDINGS: There are no findings suspicious for malignancy. Images were processed with CAD. IMPRESSION: No mammographic evidence of malignancy. A result letter of this screening mammogram will be mailed directly to the patient. RECOMMENDATION: Screening mammogram in one year. (Code:SM-B-01Y) BI-RADS CATEGORY  1: Negative. Electronically Signed   By: Baird Lyons M.D.   On: 08/25/2017 10:54         Pelvic/Bimanual Pap is not indicated today    Smoking History: Patient has is a former smoker and was not referred to quit line.    Patient Navigation: Patient education provided. Access to services provided for patient through [facility] program. No interpreter provided. No transportation provided   Colorectal Cancer Screening: Per patient has never had colonoscopy completed No complaints today. FIT test given.    Breast and Cervical Cancer Risk Assessment: Patient has family history of breast cancer, with paternal grandmother. Patient does not have history of cervical dysplasia, immunocompromised, or DES exposure in-utero.  Risk Assessment   No risk assessment data for the current encounter  Risk Scores       08/08/2021   Last edited by: Narda Rutherford, LPN   5-year risk:  2.3 %   Lifetime risk: 10 %            A: [facility] exam without pap smear No complaints with benign exam.   P: Referred [facility] to [facility] for a screening mammogram. Appointment scheduled 08/12/22.  Pascal Lux, NP 08/12/2022 10:36 AM

## 2022-12-22 ENCOUNTER — Ambulatory Visit
Admission: EM | Admit: 2022-12-22 | Discharge: 2022-12-22 | Disposition: A | Discharge status: Home / Self Care | Payer: Self-pay | Attending: Family Medicine | Admitting: Family Medicine

## 2022-12-22 DIAGNOSIS — J329 Chronic sinusitis, unspecified: Secondary | ICD-10-CM | POA: Insufficient documentation

## 2022-12-22 DIAGNOSIS — Z87891 Personal history of nicotine dependence: Secondary | ICD-10-CM | POA: Insufficient documentation

## 2022-12-22 DIAGNOSIS — J45909 Unspecified asthma, uncomplicated: Secondary | ICD-10-CM | POA: Insufficient documentation

## 2022-12-22 DIAGNOSIS — Z1152 Encounter for screening for COVID-19: Secondary | ICD-10-CM | POA: Insufficient documentation

## 2022-12-22 DIAGNOSIS — Z8616 Personal history of COVID-19: Secondary | ICD-10-CM | POA: Insufficient documentation

## 2022-12-22 LAB — SARS CORONAVIRUS 2 BY RT PCR: SARS Coronavirus 2 by RT PCR: NEGATIVE

## 2022-12-22 MED ORDER — AZITHROMYCIN 250 MG PO TABS: ORAL_TABLET | ORAL | 0 refills | Status: AC

## 2022-12-22 MED ORDER — PREDNISONE 10 MG (21) PO TBPK: ORAL_TABLET | Freq: Every day | ORAL | 0 refills | Status: DC

## 2022-12-22 NOTE — ED Provider Notes (Signed)
[facility]    CSN: [id number] Arrival date & time: 12/22/22  0900      History   Chief Complaint Chief Complaint  Patient presents with   Sinus Problem   Headache   Cough    HPI Kimberly Vasquez is a 62 y.o. female.   HPI  History obtained from the patient. [name] presents for sinus pressure, facial pain, nasal congestion, post nasal drainage, slight cough that started 3 days ago.  Endorses scratchy throat. Her "head feels like a bowling ball".  Has been taking Mucinex-DM without relief.  Concerned she may have a sinus infection. Notes she has had COVID 3 times and thought she had a sinus infection.   She hasn't taken her allergy drops due to the cost. Follows allergy/ENT and pulmonology,  Has allergy to dogs but not cats. Uses Nasacort every morning.   Has asthma, allergic rhinitis. No wheezing, chest tightness or needing rescue inhaler recently.     Past Medical History:  Diagnosis Date   Allergic rhinitis    Asthma without status asthmaticus    COVID-19 virus infection    a. 11/2019; b. 06/2020.   Diabetes mellitus without complication (HCC) 4098,1191   gestational   GERD (gastroesophageal reflux disease)    Hyperlipidemia    IGT (impaired glucose tolerance)    Morbid obesity (HCC)    Obesity    Osteopenia    Sleep apnea    Vertigo    Vitamin D deficiency     Patient Active Problem List   Diagnosis Date Noted   Diabetes mellitus without complication (HCC)    Obesity    COVID-19 virus infection 06/2020   OSA (obstructive sleep apnea) 03/17/2020   Shortness of breath 03/17/2020   History of COVID-19 03/17/2020   Asthma without status asthmaticus 11/16/2015   IGT (impaired glucose tolerance) 11/16/2015   Gastroesophageal reflux disease 11/14/2015   Snoring 11/14/2015   URI (upper respiratory infection) 04/19/2015   Morbid obesity (HCC) 08/15/2014   Extrinsic asthma 06/14/2014    Past Surgical History:  Procedure Laterality Date    ABDOMINAL HYSTERECTOMY  2001   BREAST BIOPSY Right 01/06/2020   stereo bx, ribbon clip, ATYPICAL GLANDULAR PROLIFERATION   BREAST BIOPSY WITH RADIO FREQUENCY LOCALIZER Right 02/02/2020   Procedure: BREAST BIOPSY WITH RADIO FREQUENCY LOCALIZER;  Surgeon: Carolan Shiver, MD;  Location: [facility] ORS;  Service: General;  Laterality: Right;   BREAST CYST ASPIRATION Left    BREAST EXCISIONAL BIOPSY Right 1993   benign, unable to see scar    BREAST EXCISIONAL BIOPSY Left 1979   benign   CHOLECYSTECTOMY     ESOPHAGOGASTRODUODENOSCOPY (EGD) WITH PROPOFOL N/A 01/02/2016   Procedure: ESOPHAGOGASTRODUODENOSCOPY (EGD) WITH PROPOFOL;  Surgeon: Midge Minium, MD;  Location: [facility] ENDOSCOPY;  Service: Endoscopy;  Laterality: N/A;    OB History   No obstetric history on file.      Home Medications    Prior to Admission medications   Medication Sig Start Date End Date Taking? Authorizing Provider  albuterol (VENTOLIN HFA) 108 (90 Base) MCG/ACT inhaler Inhale 2 puffs into the lungs every 6 (six) hours as needed for wheezing or shortness of breath.   Yes [provider]  azithromycin (ZITHROMAX Z-PAK) 250 MG tablet Take 2 tablets on day 1 then 1 tablet daily 12/22/22  Yes Ka Bench, DO  cetirizine (ZYRTEC) 10 MG tablet Take 10 mg by mouth daily.   Yes [provider]  fluticasone furoate-vilanterol (BREO ELLIPTA) 100-25 MCG/ACT AEPB Inhale  1 puff into the lungs daily. 04/01/22  Yes Erin Fulling, MD  omeprazole (PRILOSEC) 20 MG capsule Take 20 mg by mouth every evening.    Yes [provider]  Omeprazole Magnesium (PRILOSEC PO)    Yes [provider]  predniSONE (STERAPRED UNI-PAK 21 TAB) 10 MG (21) TBPK tablet Take by mouth daily. Take 6 tabs by mouth daily for 1, then 5 tabs for 1 day, then 4 tabs for 1 day, then 3 tabs for 1 day, then 2 tabs for 1 day, then 1 tab for 1 day. 12/22/22  Yes Taber Sweetser, DO  triamcinolone (NASACORT) 55 MCG/ACT AERO nasal inhaler  Place 2 sprays into the nose daily.   Yes [provider]  albuterol (PROVENTIL) (2.5 MG/3ML) 0.083% nebulizer solution Take 3 mLs (2.5 mg total) by nebulization every 6 (six) hours as needed for wheezing or shortness of breath. 04/02/20   Georgetta Haber, NP  albuterol (VENTOLIN HFA) 108 (90 Base) MCG/ACT inhaler Inhale 1-2 puffs into the lungs every 6 (six) hours as needed for wheezing or shortness of breath. 01/28/19   Erin Fulling, MD  diphenhydramine-acetaminophen (TYLENOL PM) 25-500 MG TABS tablet Take 1 tablet by mouth at bedtime.    [provider]  guaiFENesin-codeine 100-10 MG/5ML syrup Take 5 mLs by mouth every 6 (six) hours as needed for cough. 11/23/20   Shirlee Latch, PA-C  promethazine-dextromethorphan (PROMETHAZINE-DM) 6.25-15 MG/5ML syrup Take 5 mLs by mouth 4 (four) times daily as needed for cough. 09/14/20   Delton See, MD    Family History Family History  Problem Relation Age of Onset   Heart disease Mother    Asthma Mother    Diabetes Mother    Stroke Father    Breast cancer Paternal Grandmother 64    Social History Social History   Tobacco Use   Smoking status: Former    Current packs/day: 0.00    Average packs/day: 1 pack/day for 8.0 years (8.0 ttl pk-yrs)    Types: Cigarettes    Start date: 64    Quit date: 2000    Years since quitting: 24.8   Smokeless tobacco: Never   Tobacco comments:    quit smoking in 2000  Vaping Use   Vaping status: Never Used  Substance Use Topics   Alcohol use: Yes    Alcohol/week: 0.0 standard drinks of alcohol    Comment: occasional   Drug use: No     Allergies   Codeine   Review of Systems Review of Systems: negative unless otherwise stated in HPI.      Physical Exam Triage Vital Signs ED Triage Vitals  Encounter Vitals Group     BP 12/22/22 0946 (!) 162/97     Systolic BP Percentile --      Diastolic BP Percentile --      Pulse Rate 12/22/22 0946 83     Resp 12/22/22 0946 16      Temp 12/22/22 0946 98.7 F (37.1 C)     Temp Source 12/22/22 0946 Oral     SpO2 12/22/22 0946 94 %     Weight 12/22/22 0945 (!) 312 lb (141.5 kg)     Height 12/22/22 0945 5' 7.5" (1.715 m)     Head Circumference --      Peak Flow --      Pain Score 12/22/22 0949 6     Pain Loc --      Pain Education --      Exclude from  Growth Chart --    No data found.  Updated Vital Signs BP (!) 162/97 (BP Location: Left Wrist)   Pulse 83   Temp 98.7 F (37.1 C) (Oral)   Resp 16   Ht 5' 7.5" (1.715 m)   Wt (!) 141.5 kg   LMP 07/18/2000 (Approximate)   SpO2 94%   BMI 48.14 kg/m   Visual Acuity Right Eye Distance:   Left Eye Distance:   Bilateral Distance:    Right Eye Near:   Left Eye Near:    Bilateral Near:     Physical Exam GEN:     alert, non-toxic appearing female in no distress    HENT:  mucus membranes moist, oropharyngeal without lesions or erythema, moderate erythematous edematous turbinates, yellow-green nasal discharge in nares, maxillary greater than frontal sinus tenderness EYES:   no scleral injection or discharge RESP:  no increased work of breathing, clear to auscultation bilaterally CVS:   regular rate and rhythm Skin:   warm and dry    UC Treatments / Results  Labs (all labs ordered are listed, but only abnormal results are displayed) Labs Reviewed  SARS CORONAVIRUS 2 BY RT PCR    EKG   Radiology No results found.  Procedures Procedures (including critical care time)  Medications Ordered in UC Medications - No data to display  Initial Impression / Assessment and Plan / UC Course  I have reviewed the triage vital signs and the nursing notes.  Pertinent labs & imaging results that were available during my care of the patient were reviewed by me and considered in my medical decision making (see chart for details).       Pt is a 62 y.o. female with history of recurrent sinusitis who presents for 3 days of respiratory symptoms. Tamu is afebrile  here without recent antipyretics. Satting well on room air. Overall pt is non-toxic appearing, well hydrated, without respiratory distress. COVID testing obtained and was negative.  On exam, she has maxillary greater than frontal sinus tenderness.  She is stating that her head feels like a bowling ball.  The symptoms are concerning for possible sinusitis.  Treat with antibiotics and steroids as below.  Avoid using decongestants that contain phenylephrine as her blood pressure is quite elevated here at 162/97.  Return and ED precautions given and voiced understanding. Discussed MDM, treatment plan and plan for follow-up with patient who agrees with plan.     Final Clinical Impressions(s) / UC Diagnoses   Final diagnoses:  Recurrent sinus infections     Discharge Instructions      Stop by the pharmacy to pick up your prescriptions.  Follow up with your primary care provider as needed.      ED Prescriptions     Medication Sig Dispense Auth. Provider   predniSONE (STERAPRED UNI-PAK 21 TAB) 10 MG (21) TBPK tablet Take by mouth daily. Take 6 tabs by mouth daily for 1, then 5 tabs for 1 day, then 4 tabs for 1 day, then 3 tabs for 1 day, then 2 tabs for 1 day, then 1 tab for 1 day. 21 tablet Caidyn Henricksen, DO   azithromycin (ZITHROMAX Z-PAK) 250 MG tablet Take 2 tablets on day 1 then 1 tablet daily 6 tablet Marieli Rudy, DO      PDMP not reviewed this encounter.   Katha Cabal, DO 12/22/22 1433

## 2022-12-22 NOTE — Discharge Instructions (Signed)
Stop by the pharmacy to pick up your prescriptions.  Follow up with your primary care provider as needed.  

## 2022-12-22 NOTE — ED Triage Notes (Signed)
Pt c/o HA,cough & facial pressure x3 days. Has tried mucinex w/o relief.

## 2022-12-26 ENCOUNTER — Encounter: Payer: Self-pay | Admitting: Nurse Practitioner

## 2022-12-26 ENCOUNTER — Telehealth (INDEPENDENT_AMBULATORY_CARE_PROVIDER_SITE_OTHER): Payer: Self-pay | Admitting: Nurse Practitioner

## 2022-12-26 DIAGNOSIS — J019 Acute sinusitis, unspecified: Secondary | ICD-10-CM

## 2022-12-26 DIAGNOSIS — Z87891 Personal history of nicotine dependence: Secondary | ICD-10-CM

## 2022-12-26 DIAGNOSIS — F5101 Primary insomnia: Secondary | ICD-10-CM

## 2022-12-26 DIAGNOSIS — G4733 Obstructive sleep apnea (adult) (pediatric): Secondary | ICD-10-CM

## 2022-12-26 DIAGNOSIS — J329 Chronic sinusitis, unspecified: Secondary | ICD-10-CM | POA: Insufficient documentation

## 2022-12-26 DIAGNOSIS — G4700 Insomnia, unspecified: Secondary | ICD-10-CM | POA: Insufficient documentation

## 2022-12-26 DIAGNOSIS — J4530 Mild persistent asthma, uncomplicated: Secondary | ICD-10-CM

## 2022-12-26 MED ORDER — ZOLPIDEM TARTRATE 5 MG PO TABS: 5.0000 mg | ORAL_TABLET | Freq: Every evening | ORAL | 5 refills | Status: DC | PRN

## 2022-12-26 NOTE — Progress Notes (Signed)
Patient ID: Kimberly Vasquez, female     DOB: [birth year], 62 y.o.      MRN: [id number]  Chief Complaint  Patient presents with   Follow-up    Still has some insomnia.  CPAP does help with staying asleep.    Virtual Visit via Video Note  I connected with [name] on 12/26/22 at  9:00 AM EST by a video enabled telemedicine application and verified that I am speaking with the correct person using two identifiers.  Location: Patient: Home Provider: Office   I discussed the limitations of evaluation and management by telemedicine and the availability of in person appointments. The patient expressed understanding and agreed to proceed.  History of Present Illness: 62 year old female, former smoker followed for OSA on CPAP and asthma.  She is a patient of Dr. Clovis Fredrickson and last seen in office 12/26/2021.  Past medical history significant for GERD, diabetes, obesity.  TESTS/EVENTS: 08/15/2014 PFT: FVC 73, FEV1 72, ratio 78, DLCO 115 12/26/2021 CT chest without contrast: Resolution of groundglass opacity in the left lower lobe.  No new or suspicious pulmonary nodules.  Prior cholecystectomy.  Of 4 mm nodule in the right hepatic lobe, likely benign.  Atherosclerosis.  8 mm mass in left breast-recommend mammogram 02/10/2022 CXR: Clear lungs  12/26/2021: OV with Dr. Belia Heman.  Asthma is stable at this time.  Recently completed antibiotics for sinus infection.  On Breo for maintenance.  Uses albuterol infrequently.  OSA on CPAP.  Download shows excellent compliance and control.  12/26/2022: Today - follow up Discussed the use of AI scribe software for clinical note transcription with the patient, who gave verbal consent to proceed.  History of Present Illness   The patient, followed for obstructive sleep apnea (OSA) and asthma, presents for a routine follow-up. She reports a satisfactory experience with her CPAP machine, noting improved sleep quality. She feels like she sleeps well with it. No  issues with leaks or mask fit. She does still have some daytime tiredness but says that this is better with her CPAP. Unfortunately, she continues to have difficulty falling asleep and has been using over-the-counter Tylenol PM to aid sleep onset. She does okay once she's asleep and doesn't tend to wake up but she can lay there for over an hour without medication. She's never tried anything prescription for sleep. This has been ongoing for years now. CPAP did help with nighttime awakenings but did not change sleep latency. She denies any sleep parasomnias/paralysis. No drowsy driving. Mood is stable without active depression or anxiety.   In addition to OSA, the patient's asthma is managed with Breo, which she confirms she is taking as prescribed. She recently sought care at an urgent care center for a sinus infection, for which she was prescribed a course of azithromycin and a round of prednisone. The patient reports improvement in her sinus symptoms with z pack but has not used the prednisone as she has not experienced any wheezing or difficulties with her breathing. She does note an occasional cough, which she attributes to post-nasal drip. Over-the-counter treatments include Nasacort for nasal symptoms and generic Prilosec for acid reflux. She has tried Mucinex DM for the cough, but it resulted in elevated blood pressure. She's used tessalon in the past without success. The cough is not bothering her and she would prefer to monitor it for now. No rescue inhaler use.   The patient is uninsured and uses GoodRx for medication discounts.  11/25/2022-12/24/2022: CPAP 7 cmH2O 30/30 days; 100% >4 hr; average use 9 hr 25 min Leaks 95th 5.3 AHI 0.4  Allergies  Allergen Reactions   Codeine Nausea And Vomiting    Can't take it in pill form   Immunization History  Administered Date(s) Administered   Influenza,inj,Quad PF,6+ Mos 12/08/2014, 12/09/2017   PFIZER(Purple Top)SARS-COV-2 Vaccination  10/26/2019, 11/16/2019   Past Medical History:  Diagnosis Date   Allergic rhinitis    Asthma without status asthmaticus    COVID-19 virus infection    a. 11/2019; b. 06/2020.   Diabetes mellitus without complication (HCC) 2694,8546   gestational   GERD (gastroesophageal reflux disease)    Hyperlipidemia    IGT (impaired glucose tolerance)    Morbid obesity (HCC)    Obesity    Osteopenia    Sleep apnea    Vertigo    Vitamin D deficiency     Tobacco History: Social History   Tobacco Use  Smoking Status Former   Current packs/day: 0.00   Average packs/day: 1 pack/day for 8.0 years (8.0 ttl pk-yrs)   Types: Cigarettes   Start date: 19   Quit date: 2000   Years since quitting: 24.8  Smokeless Tobacco Never  Tobacco Comments   quit smoking in 2000   Counseling given: Not Answered Tobacco comments: quit smoking in 2000   Outpatient Medications Prior to Visit  Medication Sig Dispense Refill   albuterol (PROVENTIL) (2.5 MG/3ML) 0.083% nebulizer solution Take 3 mLs (2.5 mg total) by nebulization every 6 (six) hours as needed for wheezing or shortness of breath. 75 mL 0   albuterol (VENTOLIN HFA) 108 (90 Base) MCG/ACT inhaler Inhale 1-2 puffs into the lungs every 6 (six) hours as needed for wheezing or shortness of breath. 1 g 6   albuterol (VENTOLIN HFA) 108 (90 Base) MCG/ACT inhaler Inhale 2 puffs into the lungs every 6 (six) hours as needed for wheezing or shortness of breath.     azithromycin (ZITHROMAX Z-PAK) 250 MG tablet Take 2 tablets on day 1 then 1 tablet daily 6 tablet 0   cetirizine (ZYRTEC) 10 MG tablet Take 10 mg by mouth daily.     diphenhydramine-acetaminophen (TYLENOL PM) 25-500 MG TABS tablet Take 1 tablet by mouth at bedtime.     fluticasone furoate-vilanterol (BREO ELLIPTA) 100-25 MCG/ACT AEPB Inhale 1 puff into the lungs daily. 180 each 3   guaiFENesin-codeine 100-10 MG/5ML syrup Take 5 mLs by mouth every 6 (six) hours as needed for cough. 118 mL 0    omeprazole (PRILOSEC) 20 MG capsule Take 20 mg by mouth every evening.      Omeprazole Magnesium (PRILOSEC PO)      triamcinolone (NASACORT) 55 MCG/ACT AERO nasal inhaler Place 2 sprays into the nose daily.     predniSONE (STERAPRED UNI-PAK 21 TAB) 10 MG (21) TBPK tablet Take by mouth daily. Take 6 tabs by mouth daily for 1, then 5 tabs for 1 day, then 4 tabs for 1 day, then 3 tabs for 1 day, then 2 tabs for 1 day, then 1 tab for 1 day. (Patient not taking: Reported on 12/26/2022) 21 tablet 0   promethazine-dextromethorphan (PROMETHAZINE-DM) 6.25-15 MG/5ML syrup Take 5 mLs by mouth 4 (four) times daily as needed for cough. (Patient not taking: Reported on 12/26/2022) 180 mL 0   No facility-administered medications prior to visit.     Review of Systems:   Constitutional: No weight loss or gain, night sweats, fevers, chills, or lassitude. +daytime fatigue  HEENT:  No headaches, difficulty swallowing, tooth/dental problems, or sore throat. No sneezing, itching, ear ache. +nasal congestion, post nasal drip, sinus pressure (improving) CV:  No chest pain, orthopnea, PND, swelling in lower extremities, anasarca, dizziness, palpitations, syncope Resp: No shortness of breath with exertion or at rest. No excess mucus or change in color of mucus. No productive or non-productive. No hemoptysis. No wheezing.  No chest wall deformity GI:  No heartburn, indigestion, abdominal pain, nausea, vomiting, diarrhea, change in bowel habits, loss of appetite, bloody stools.  GU: No dysuria, change in color of urine, urgency or frequency.  Skin: No rash, lesions, ulcerations MSK:  No joint pain or swelling.   Neuro: No dizziness or lightheadedness.  Psych: No depression or anxiety. Mood stable. +sleep disturbance  Observations/Objective: Patient is well-developed, well-nourished in no acute distress. A&Ox3. Resting comfortably at home. Unlabored breathing. Speech is clear and coherent with logical content.     Assessment and Plan:    Obstructive Sleep Apnea On CPAP therapy with excellent compliance, minimal leaks, and no significant breakthrough events. Receives benefit from use. Understands risks of untreated OSA. Aware of proper care/use of device. Safe driving practices reviewed - Continue CPAP nightly - Maintain clean equipment and change regularly - Apply CPAP within 10 minutes of taking sleep aid to avoid falling asleep without it, as this can worsen sleep apnea   Insomnia Reports difficulty falling asleep, currently using Tylenol PM, which contains diphenhydramine, not ideal for long-term insomnia management. Discussed risks of long-term diphenhydramine use and benefits of prescription sleep aids. No history of sleepwalking, reducing risk of adverse sleep behaviors with sleep medications. Ambien chosen due to cost considerations and insurance status. Emphasized importance of taking Ambien within 30 minutes of intended sleep onset and immediately going to bed to avoid adverse sleep behaviors. - Prescribe Ambien (zolpidem) 5 mg, instruct to take within 30 minutes of intended sleep onset and immediately go to bed - Advise against driving after taking Ambien and ensure 7-8 hours of sleep - Monitor for any mood changes and stop medication if these develop - Sleep hygiene reviewed   Acute Sinusitis Treated at urgent care with azithromycin and prednisone. Reports improvement but still has a mild cough likely due to post-nasal drip. Using Nasacort  for symptom management. Discussed adding guaifenesin to help with cough and saline nasal rinses to expedite recovery.  - Recommend saline nasal rinses (e.g., neti pot or Neilmed rinse) - Monitor symptoms and consider steroid pack if cough persists  Asthma Managed with Breo. No recent exacerbations or wheezing reported. Compliance with Virgel Bouquet is good. - Continue Breo as prescribed. Brush tongue and rinse mouth afterwards  - Action plan in  place  Follow-up - Schedule follow-up video visit in 6-8 weeks to reassess sleep quality and medication efficacy.         I discussed the assessment and treatment plan with the patient. The patient was provided an opportunity to ask questions and all were answered. The patient agreed with the plan and demonstrated an understanding of the instructions.   The patient was advised to call back or seek an in-person evaluation if the symptoms worsen or if the condition fails to improve as anticipated.  I provided 32 minutes of non-face-to-face time during this encounter.   Noemi Chapel, NP

## 2022-12-26 NOTE — Patient Instructions (Addendum)
Continue to use CPAP every night, minimum of 4-6 hours a night.  Change equipment as directed. Wash your tubing with warm soap and water daily, hang to dry. Wash humidifier portion weekly. Use bottled, distilled water and change daily Be aware of reduced alertness and do not drive or operate heavy machinery if experiencing this or drowsiness.  Exercise encouraged, as tolerated. Healthy weight management discussed.  Avoid or decrease alcohol consumption and medications that make you more sleepy, if possible. Notify if persistent daytime sleepiness occurs even with consistent use of PAP therapy.  Ambien (zolpidem) 5 mg At bedtime as needed for sleep. Instruct to take within 30 minutes of intended sleep onset and immediately go to bed - Advise against driving after taking Ambien and ensure 7-8 hours of time in the bed - Monitor for any mood changes and stop medication if these develop  Continue Breo 1 puff daily. Brush tongue and rinse mouth afterwards Continue Albuterol inhaler 2 puffs every 6 hours as needed for shortness of breath or wheezing. Notify if symptoms persist despite rescue inhaler/neb use.  Continue nasal sprays Use saline rinses with bottled distilled water  Guaifenesin 600 mg Twice daily as needed for cough/congestion   Notify if asthma symptoms worsen  Follow up in 6-8 weeks with Dr. Belia Heman or Philis Nettle. Ok to do video visit. If symptoms do not improve or worsen, please contact office for sooner follow up or seek emergency care

## 2023-02-21 ENCOUNTER — Ambulatory Visit
Admission: EM | Admit: 2023-02-21 | Discharge: 2023-02-21 | Disposition: A | Discharge status: Home / Self Care | Payer: Self-pay | Attending: Emergency Medicine | Admitting: Emergency Medicine

## 2023-02-21 DIAGNOSIS — Z8639 Personal history of other endocrine, nutritional and metabolic disease: Secondary | ICD-10-CM

## 2023-02-21 DIAGNOSIS — Z758 Other problems related to medical facilities and other health care: Secondary | ICD-10-CM

## 2023-02-21 DIAGNOSIS — J4521 Mild intermittent asthma with (acute) exacerbation: Secondary | ICD-10-CM

## 2023-02-21 DIAGNOSIS — R030 Elevated blood-pressure reading, without diagnosis of hypertension: Secondary | ICD-10-CM

## 2023-02-21 DIAGNOSIS — M5431 Sciatica, right side: Secondary | ICD-10-CM

## 2023-02-21 LAB — GLUCOSE, CAPILLARY: Glucose-Capillary: 136 mg/dL — ABNORMAL HIGH (ref 70–99)

## 2023-02-21 MED ORDER — PREDNISONE 10 MG (21) PO TBPK
ORAL_TABLET | ORAL | 0 refills | Status: DC
Start: 2023-02-21 — End: 2023-06-11

## 2023-02-21 NOTE — Discharge Instructions (Addendum)
 Please get established with PCP for general medical issues, labs,physical, recheck blood sugar and blood pressure, etc.  Take meds as directed. Return as needed. If you develop chest pain,shortness of breath or worsening issues go to ER for further evaluation. May take over the counter Coricidin HBP for symptom management.

## 2023-02-21 NOTE — ED Triage Notes (Signed)
 Pt c/o wheezing x1 day. States she has had productive (yellowish clear) cough x2 days and has been taking Mucinex DM. Last night used nebulizer with some relief.  Pt also c/o sciatic nerve pain to RLE.

## 2023-02-21 NOTE — ED Provider Notes (Signed)
 [facility]    CSN: [id number] Arrival date & time: 02/21/23  0800      History   Chief Complaint Chief Complaint  Patient presents with   Wheezing   Leg Pain    HPI Roxana Lai is a 63 y.o. female.   63 year old female, Audreanna Torrisi, presents to urgent care for evaluation of wheezing x 1 day,cough x 2 days and right lower leg pain. Pt used nebulizer last night for help w symptoms with some relief. Pt has no PCP.  Patient denies any loss of bowel or bladder, no loss of function, no saddle numbness, no fall or injury.  Past medical history diabetes, will check blood sugar in office. Pt denies any PMH other than asthma does not have PCP,no physical,no recent lab work.  The history is provided by the patient. No language interpreter was used.    Past Medical History:  Diagnosis Date   Allergic rhinitis    Asthma without status asthmaticus    COVID-19 virus infection    a. 11/2019; b. 06/2020.   Diabetes mellitus without complication (HCC) 8015,8008   gestational   GERD (gastroesophageal reflux disease)    Hyperlipidemia    IGT (impaired glucose tolerance)    Morbid obesity (HCC)    Obesity    Osteopenia    Sleep apnea    Vertigo    Vitamin D deficiency     Patient Active Problem List   Diagnosis Date Noted   Sciatica of right side 02/21/2023   Elevated blood pressure reading 02/21/2023   History of diabetes mellitus 02/21/2023   Mild intermittent asthma with acute exacerbation 02/21/2023   Does not have primary care provider 02/21/2023   Insomnia 12/26/2022   Sinusitis 12/26/2022   Diabetes mellitus without complication (HCC)    Obesity    COVID-19 virus infection 06/2020   OSA (obstructive sleep apnea) 03/17/2020   Shortness of breath 03/17/2020   History of COVID-19 03/17/2020   Asthma without status asthmaticus 11/16/2015   IGT (impaired glucose tolerance) 11/16/2015   Gastroesophageal reflux disease 11/14/2015   Snoring 11/14/2015    URI (upper respiratory infection) 04/19/2015   Morbid obesity (HCC) 08/15/2014   Extrinsic asthma 06/14/2014    Past Surgical History:  Procedure Laterality Date   ABDOMINAL HYSTERECTOMY  2001   BREAST BIOPSY Right 01/06/2020   stereo bx, ribbon clip, ATYPICAL GLANDULAR PROLIFERATION   BREAST BIOPSY WITH RADIO FREQUENCY LOCALIZER Right 02/02/2020   Procedure: BREAST BIOPSY WITH RADIO FREQUENCY LOCALIZER;  Surgeon: Rodolph Romano, MD;  Location: [facility];  Service: General;  Laterality: Right;   BREAST CYST ASPIRATION Left    BREAST EXCISIONAL BIOPSY Right 1993   benign, unable to see scar    BREAST EXCISIONAL BIOPSY Left 1979   benign   CHOLECYSTECTOMY     ESOPHAGOGASTRODUODENOSCOPY (EGD) WITH PROPOFOL  N/A 01/02/2016   Procedure: ESOPHAGOGASTRODUODENOSCOPY (EGD) WITH PROPOFOL ;  Surgeon: Rogelia Copping, MD;  Location: [facility];  Service: Endoscopy;  Laterality: N/A;    OB History   No obstetric history on file.      Home Medications    Prior to Admission medications   Medication Sig Start Date End Date Taking? Authorizing Provider  predniSONE  (STERAPRED UNI-PAK 21 TAB) 10 MG (21) TBPK tablet Take 6 tabs by mouth daily  for 1 days, then 5 tabs for 1 days, then 4 tabs for 1 days, then 3 tabs for 1 days, 2 tabs for 1 days, then 1 tab by mouth  daily for 1 days 02/21/23  Yes Elveria Lauderbaugh, Rilla, NP  albuterol  (PROVENTIL ) (2.5 MG/3ML) 0.083% nebulizer solution Take 3 mLs (2.5 mg total) by nebulization every 6 (six) hours as needed for wheezing or shortness of breath. 04/02/20   Burky, Natalie B, NP  albuterol  (VENTOLIN  HFA) 108 (90 Base) MCG/ACT inhaler Inhale 1-2 puffs into the lungs every 6 (six) hours as needed for wheezing or shortness of breath. 01/28/19   Kasa, Kurian, MD  albuterol  (VENTOLIN  HFA) 108 (90 Base) MCG/ACT inhaler Inhale 2 puffs into the lungs every 6 (six) hours as needed for wheezing or shortness of breath.    [provider]  azithromycin  (ZITHROMAX   Z-PAK) 250 MG tablet Take 2 tablets on day 1 then 1 tablet daily 12/22/22   Brimage, Vondra, DO  cetirizine (ZYRTEC) 10 MG tablet Take 10 mg by mouth daily.    [provider]  diphenhydramine -acetaminophen  (TYLENOL  PM) 25-500 MG TABS tablet Take 1 tablet by mouth at bedtime.    [provider]  fluticasone  furoate-vilanterol (BREO ELLIPTA ) 100-25 MCG/ACT AEPB Inhale 1 puff into the lungs daily. 04/01/22   Kasa, Kurian, MD  guaiFENesin -codeine  100-10 MG/5ML syrup Take 5 mLs by mouth every 6 (six) hours as needed for cough. 11/23/20   Arvis Jolan NOVAK, PA-C  omeprazole (PRILOSEC) 20 MG capsule Take 20 mg by mouth every evening.     [provider]  Omeprazole Magnesium (PRILOSEC PO)     [provider]  triamcinolone (NASACORT) 55 MCG/ACT AERO nasal inhaler Place 2 sprays into the nose daily.    [provider]  zolpidem  (AMBIEN ) 5 MG tablet Take 1 tablet (5 mg total) by mouth at bedtime as needed for sleep. 12/26/22   Cobb, Comer GAILS, NP    Family History Family History  Problem Relation Age of Onset   Heart disease Mother    Asthma Mother    Diabetes Mother    Stroke Father    Breast cancer Paternal Grandmother 82    Social History Social History   Tobacco Use   Smoking status: Former    Current packs/day: 0.00    Average packs/day: 1 pack/day for 8.0 years (8.0 ttl pk-yrs)    Types: Cigarettes    Start date: 39    Quit date: 2000    Years since quitting: 25.0   Smokeless tobacco: Never   Tobacco comments:    quit smoking in 2000  Vaping Use   Vaping status: Never Used  Substance Use Topics   Alcohol use: Yes    Alcohol/week: 0.0 standard drinks of alcohol    Comment: occasional   Drug use: No     Allergies   Codeine    Review of Systems Review of Systems  HENT:  Positive for congestion.   Respiratory:  Positive for wheezing.   Musculoskeletal:  Positive for myalgias.  All other systems reviewed and are  negative.    Physical Exam Triage Vital Signs ED Triage Vitals  Encounter Vitals Group     BP 02/21/23 0822 (!) 166/80     Systolic BP Percentile --      Diastolic BP Percentile --      Pulse Rate 02/21/23 0822 88     Resp 02/21/23 0822 20     Temp 02/21/23 0824 (!) 97.5 F (36.4 C)     Temp Source 02/21/23 0824 Oral     SpO2 02/21/23 0822 94 %     Weight --      Height --  Head Circumference --      Peak Flow --      Pain Score 02/21/23 0817 4     Pain Loc --      Pain Education --      Exclude from Growth Chart --    No data found.  Updated Vital Signs BP (!) 166/80 (BP Location: Left Arm)   Pulse 88   Temp (!) 97.5 F (36.4 C) (Oral)   Resp 20   LMP 07/18/2000 (Approximate)   SpO2 94%   Visual Acuity Right Eye Distance:   Left Eye Distance:   Bilateral Distance:    Right Eye Near:   Left Eye Near:    Bilateral Near:     Physical Exam Vitals and nursing note reviewed.  Constitutional:      Appearance: Normal appearance. She is well-developed and well-groomed. She is obese.  Cardiovascular:     Rate and Rhythm: Normal rate.     Pulses:          Dorsalis pedis pulses are 2+ on the right side and 2+ on the left side.  Pulmonary:     Effort: Pulmonary effort is normal.     Breath sounds: Normal breath sounds and air entry.  Neurological:     General: No focal deficit present.     Mental Status: She is alert and oriented to person, place, and time.     GCS: GCS eye subscore is 4. GCS verbal subscore is 5. GCS motor subscore is 6.     Cranial Nerves: No cranial nerve deficit.     Sensory: No sensory deficit.  Psychiatric:        Attention and Perception: Attention normal.        Mood and Affect: Mood normal.        Speech: Speech normal.        Behavior: Behavior is cooperative.      UC Treatments / Results  Labs (all labs ordered are listed, but only abnormal results are displayed) Labs Reviewed  GLUCOSE, CAPILLARY - Abnormal; Notable for  the following components:      Result Value   Glucose-Capillary 136 (*)    All other components within normal limits  CBG MONITORING, ED    EKG   Radiology No results found.  Procedures Procedures (including critical care time)  Medications Ordered in UC Medications - No data to display  Initial Impression / Assessment and Plan / UC Course  I have reviewed the triage vital signs and the nursing notes.  Pertinent labs & imaging results that were available during my care of the patient were reviewed by me and considered in my medical decision making (see chart for details).  Clinical Course as of 02/21/23 9062  [place] Feb 21, 2023  0845 Cbg was 136 in office [JD]    Clinical Course User Index [JD] Nidal Rivet, Rilla, NP   Discussed exam findings and plan of care with patient, will refer to PCP, strict go to ER precautions given, patient verbalized understanding this provider.  Ddx: Mild intermittent asthma with acute exacerbation, right-sided sciatica, history of diabetes, elevated blood pressure reading, does not have PCP Final Clinical Impressions(s) / UC Diagnoses   Final diagnoses:  Sciatica of right side  Elevated blood pressure reading  History of diabetes mellitus  Mild intermittent asthma with acute exacerbation  Does not have primary care provider     Discharge Instructions      Please get established with PCP for general  medical issues, labs,physical, recheck blood sugar and blood pressure, etc.  Take meds as directed. Return as needed. If you develop chest pain,shortness of breath or worsening issues go to ER for further evaluation. May take over the counter Coricidin HBP for symptom management.     ED Prescriptions     Medication Sig Dispense Auth. Provider   predniSONE  (STERAPRED UNI-PAK 21 TAB) 10 MG (21) TBPK tablet Take 6 tabs by mouth daily  for 1 days, then 5 tabs for 1 days, then 4 tabs for 1 days, then 3 tabs for 1 days, 2 tabs for 1 days, then  1 tab by mouth daily for 1 days 21 tablet Cherokee Boccio, NP      PDMP not reviewed this encounter.   Aminta Loose, NP 02/21/23 [id number]

## 2023-03-05 ENCOUNTER — Other Ambulatory Visit: Payer: Self-pay

## 2023-03-05 MED ORDER — FLUTICASONE FUROATE-VILANTEROL 100-25 MCG/ACT IN AEPB: 1.0000 | INHALATION_SPRAY | Freq: Every day | RESPIRATORY_TRACT | 3 refills | Status: DC

## 2023-03-05 NOTE — Progress Notes (Signed)
 Patient dropped off paperwork for the [employer] patient assistance program. She needed a printed Breo prescription to send in to the pharmacy. The prescription has been printed and mailed to the patient's pharmacy.  Nothing further needed.

## 2023-03-31 ENCOUNTER — Ambulatory Visit: Payer: Self-pay | Admitting: Physician Assistant

## 2023-06-11 ENCOUNTER — Ambulatory Visit: Payer: Self-pay | Admitting: Nurse Practitioner

## 2023-06-11 ENCOUNTER — Telehealth: Payer: Self-pay | Admitting: Internal Medicine

## 2023-06-11 MED ORDER — PREDNISONE 10 MG PO TABS: 10.0000 mg | ORAL_TABLET | Freq: Every day | ORAL | 0 refills | Status: AC

## 2023-06-11 NOTE — Telephone Encounter (Signed)
 Please last signed encounter. Patient is willing to take the pernasone as Dr. Auston Left recommended. Please contact patient.

## 2023-06-11 NOTE — Telephone Encounter (Signed)
 I have sent in the Prednisone and notified the patient.   Nothing further needed.

## 2023-06-11 NOTE — Telephone Encounter (Signed)
 Per Telephone encounter from 4/23,  [place], Debra H18 minutes ago (1:46 PM)   Please last signed encounter. Patient is willing to take the pernasone as Dr. Auston Left recommended. Please contact patient.

## 2023-06-11 NOTE — Telephone Encounter (Signed)
 E2C2 Pulmonary Triage - Initial Assessment Questions "Chief Complaint (e.g., cough, sob, wheezing, fever, chills, sweat or additional symptoms) *Go to specific symptom protocol after initial questions. Patient calling to report wheezing since this past Saturday. Also endorses coughing and some shortness of breath. Patient reports taking daily allergy medication. Patient reports hx of OSA and asthma. Patient endorses being seen by PCP on Monday and found to have expiratory wheezes on the left side. Patient was started on a Z pack but not started on prednisone . Patient was recently diagnosed with diabetes and PCP was concerned with putting her on steroids. PCP told patient to speak to pulmonary about being put on prednisone .  Per protocol, patient is recommended to be seen today but no availability via decision tree. Patient will need a follow up call from staff. Patient verbalized understanding and all questions answered.    "How long have symptoms been present?" Symptoms started on Saturday  Have you tested for COVID or Flu? Note: If not, ask patient if a home test can be taken. If so, instruct patient to call back for positive results. No  MEDICINES:   "Have you used any OTC meds to help with symptoms?" Yes If yes, ask "What medications?" Zrytec Nasacort  "Have you used your inhalers/maintenance medication?" Yes If yes, "What medications?" Albuterol  inhaler and nebulizer Breo Ellipta   If inhaler, ask "How many puffs and how often?" Note: Review instructions on medication in the chart. Breo Ellipta  1 puff Daily Albuterol  1-2 puffs q6h PRN OXYGEN: "Do you wear supplemental oxygen?" No If yes, "How many liters are you supposed to use?"   "Do you monitor your oxygen levels?" No If yes, "What is your reading (oxygen level) today?" Patient doesn't have a monitor  "What is your usual oxygen saturation reading?"  (Note: Pulmonary O2 sats should be 90% or greater) Patient is unable to say  what her usual oxygen saturation is  Copied from CRM [id number]. Topic: Clinical - Red Word Triage >> Jun 11, 2023 10:08 AM Tyronne Galloway wrote: Red Word that prompted transfer to Nurse Triage: Pt stated she has been wheezing for several days roughly since last Saturday. Pt has been recently diagnosed as type 2 diabetic. Pt was put on a zpack and was told her wheezing is coming from her left lung. Reason for Disposition  [1] MILD difficulty breathing (e.g., minimal/no SOB at rest, SOB with walking, pulse <100) AND [2] NEW-onset or WORSE than normal  Answer Assessment - Initial Assessment Questions 1. RESPIRATORY STATUS: "Describe your breathing?" (e.g., wheezing, shortness of breath, unable to speak, severe coughing)      Wheezing, cough productive, some shortness of breath 2. ONSET: "When did this breathing problem begin?"      Started Saturday 3. PATTERN "Does the difficult breathing come and go, or has it been constant since it started?"      Comes and goes with breathing treatments.  4. SEVERITY: "How bad is your breathing?" (e.g., mild, moderate, severe)    - MILD: No SOB at rest, mild SOB with walking, speaks normally in sentences, can lie down, no retractions, pulse < 100.    - MODERATE: SOB at rest, SOB with minimal exertion and prefers to sit, cannot lie down flat, speaks in phrases, mild retractions, audible wheezing, pulse 100-120.    - SEVERE: Very SOB at rest, speaks in single words, struggling to breathe, sitting hunched forward, retractions, pulse > 120      Mild-Moderate 5. RECURRENT SYMPTOM: "Have you had difficulty  breathing before?" If Yes, ask: "When was the last time?" and "What happened that time?"      yes 6. CARDIAC HISTORY: "Do you have any history of heart disease?" (e.g., heart attack, angina, bypass surgery, angioplasty)      no 7. LUNG HISTORY: "Do you have any history of lung disease?"  (e.g., pulmonary embolus, asthma, emphysema)     OSA, asthma 8. CAUSE: "What do you  think is causing the breathing problem?"      asthma 9. OTHER SYMPTOMS: "Do you have any other symptoms? (e.g., dizziness, runny nose, cough, chest pain, fever)     Cough,  10. O2 SATURATION MONITOR:  "Do you use an oxygen saturation monitor (pulse oximeter) at home?" If Yes, ask: "What is your reading (oxygen level) today?" "What is your usual oxygen saturation reading?" (e.g., 95%)       Unable to tell  11. PREGNANCY: "Is there any chance you are pregnant?" "When was  12. TRAVEL: "Have you traveled out of the country in the last month?" (e.g., travel history, exposures)       no  Protocols used: Breathing Difficulty-A-AH

## 2023-06-11 NOTE — Addendum Note (Signed)
 Addended by: Pepper Boyer on: 06/11/2023 02:10 PM   Modules accepted: Orders

## 2023-06-11 NOTE — Telephone Encounter (Signed)
 This message was added to another message from today (06/11/2023).  Closing this encounter.

## 2023-06-23 ENCOUNTER — Other Ambulatory Visit: Payer: Self-pay | Admitting: Nurse Practitioner

## 2023-06-23 DIAGNOSIS — F5101 Primary insomnia: Secondary | ICD-10-CM

## 2023-06-23 NOTE — Telephone Encounter (Signed)
 Copied from CRM [id number]. Topic: Clinical - Medication Refill >> Jun 23, 2023 12:38 PM Kimberly Vasquez B wrote: Most Recent Primary Care Visit:   Medication: zolpidem  (AMBIEN ) 5 MG tablet  Has the patient contacted their pharmacy? Yes (Agent: If no, request that the patient contact the pharmacy for the refill. If patient does not wish to contact the pharmacy document the reason why and proceed with request.) (Agent: If yes, when and what did the pharmacy advise?)  Is this the correct pharmacy for this prescription? Yes If no, delete pharmacy and type the correct one.  This is the patient's preferred pharmacy:   [facility] [address] [place] - [address] [place] [address] Phone: [phone] Fax: [phone]   Has the prescription been filled recently? No  Is the patient out of the medication? No  Has the patient been seen for an appointment in the last year OR does the patient have an upcoming appointment? Yes  Can we respond through MyChart? Yes  Agent: Please be advised that Rx refills may take up to 3 business days. We ask that you follow-up with your pharmacy.

## 2023-06-24 NOTE — Telephone Encounter (Signed)
**Note De-identified  [place] Obfuscation** Please advise 

## 2023-06-25 ENCOUNTER — Telehealth: Payer: Self-pay

## 2023-06-25 NOTE — Telephone Encounter (Signed)
 LVM to schedule a follow up with katie

## 2023-06-25 NOTE — Telephone Encounter (Signed)
 She was supposed to have follow up 6-8 weeks after her last appt. Has not been seen since November. Will provide one courtesy refill but she will need a follow up for further refills since this is controlled. Thanks.

## 2023-06-26 ENCOUNTER — Encounter: Payer: Self-pay | Admitting: Internal Medicine

## 2023-06-26 ENCOUNTER — Ambulatory Visit (INDEPENDENT_AMBULATORY_CARE_PROVIDER_SITE_OTHER): Payer: Self-pay | Admitting: Internal Medicine

## 2023-06-26 VITALS — BP 136/78 | HR 93 | Temp 98.5°F | Ht 67.5 in | Wt 313.0 lb

## 2023-06-26 DIAGNOSIS — J4520 Mild intermittent asthma, uncomplicated: Secondary | ICD-10-CM

## 2023-06-26 DIAGNOSIS — G4733 Obstructive sleep apnea (adult) (pediatric): Secondary | ICD-10-CM

## 2023-06-26 NOTE — Telephone Encounter (Signed)
 She has an appointment today with [facility] in [place]

## 2023-06-26 NOTE — Patient Instructions (Addendum)
 Excellent Job A+ GOLD STAR!!  Continue CPAP as prescribed  Patient Instructions Continue to use CPAP every night, minimum of 4-6 hours a night.  Change equipment every 30 days or as directed by DME.  Wash your tubing with warm soap and water daily, hang to dry. Wash humidifier portion weekly. Use bottled, distilled water and change daily   Be aware of reduced alertness and do not drive or operate heavy machinery if experiencing this or drowsiness.  Exercise encouraged, as tolerated. Encouraged proper weight management.  Important to get eight or more hours of sleep  Limiting the use of the computer and television before bedtime.  Decrease naps during the day, so night time sleep will become enhanced.  Limit caffeine, and sleep deprivation.    Avoid Allergens and Irritants Avoid secondhand smoke Avoid SICK contacts Recommend  Masking  when appropriate Recommend Keep up-to-date with vaccinations  Continue Breo as prescribed Recommend rinsing mouth out after every use Use albuterol  as needed

## 2023-06-26 NOTE — Progress Notes (Signed)
 @Patient  ID: Kimberly Vasquez, female    DOB: 1961-01-21, 63 y.o.   MRN: [id number]   Referring provider: No ref. provider found  SYNOPSIS 63 year old female former smoker followed in our office for asthma, obstructive sleep apnea  PMH:.  Obesity, GERD Smoker/ Smoking History: Former smoker.  Quit 2000.  4-pack-year smoking Maintenance: Breo 100 CPAP compliance report 02/16/20-03/16/20: 30/30 day usage, 0.3 AHI. She wants us  to assume responsibility for OSA not ENT.  Covid positive 10/21 - Covid positive soon after 2nd vaccination,booster due in March. Received MAB 11/22/19, states 24 hours after infusion sense of taste and smell returned.     CC Follow-up assessment OSA Follow-up assessment of asthma History of pulmonary nodule   HPI   Assessment of asthma No exacerbation at this time No evidence of heart failure at this time No evidence or signs of infection at this time No respiratory distress No fevers, chills, nausea, vomiting, diarrhea No evidence of lower extremity edema No evidence hemoptysis Continue Breo 100 maintenance therapy On Albuterol  and uses infrequently Mild intermittent asthma controlled  Assessment of OSA Patient uses and benefits from therapy Using CPAP nightly and with naps Pressure setting is comfortable and is sleeping well.  Previous download shows excellent compliance report AHI reduced to 0.3 Excellent compliance 100% for days 100% for greater than 4 hours  GERD under Control    RE-ABNORMAL CT CHEST Independently Reviewed By Me  Very minuscule LLL nodule on CT chest March 2023 Repeat CT chest November 2023 showed resolution of left lower lobe nodule    Tests:   FENO:  No results found for: "NITRICOXIDE"  PFT:    Latest Ref Rng & Units 08/15/2014    1:48 PM  PFT Results  FVC-Pre L 2.82  P  FVC-Predicted Pre % 73  P  FVC-Post L 2.87  P  FVC-Predicted Post % 74  P  Pre FEV1/FVC % % 77  P  Post FEV1/FCV % % 78  P   FEV1-Pre L 2.17  P  FEV1-Predicted Pre % 72  P  FEV1-Post L 2.22  P  DLCO uncorrected ml/min/mmHg 33.37  P  DLCO UNC% % 115  P  DLVA Predicted % 108  P    P Preliminary result    WALK:     08/15/2014    2:17 PM  SIX MIN WALK  Medications Claritin, Advair @ 10am  Supplimental Oxygen during Test? (L/min) No  Laps 7  Partial Lap (in Meters) 9 meters  Baseline BP (sitting) 134/80  Baseline Heartrate 82  Baseline Dyspnea (Borg Scale) 2  Baseline Fatigue (Borg Scale) 2  Baseline SPO2 93 %  BP (sitting) 148/88  Heartrate 107  Dyspnea (Borg Scale) 3  Fatigue (Borg Scale) 3  SPO2 96 %  BP (sitting) 132/84  Heartrate 93  SPO2 96 %  Stopped or Paused before Six Minutes No  Distance Completed 345 meters  Tech Comments: Pt walked at moderate pace w/o complications.  Provider Comments: Noted, see clinic note      Specialty Problems       Pulmonary Problems   Extrinsic asthma   Pulmonary function testing 08/15/2014 FVC 73% FEV1 72% FEV1/FVC 77% FRC 41% RV 51% TLC 65% RV/TLC 76% Impression: Moderate obstruction with no stomach and broncho-dilator response. Severe to moderate decrease in RV and TLC's, suggesting a restrictive component, severe reduction in ERV, these findings together can be seen in abdominal/chest wall obesity. Clinical correlation is considered/advised. 6 minute walk test: 1131  feet/345 m, low saturation 93%, highest heart rate 107.      URI (upper respiratory infection)   Snoring   Asthma without status asthmaticus   OSA (obstructive sleep apnea)   Shortness of breath   Sinusitis   Mild intermittent asthma with acute exacerbation    Allergies  Allergen Reactions   Codeine  Nausea And Vomiting    Can't take it in pill form  Other Reaction(s): nausea (pill form)    Immunization History  Administered Date(s) Administered   Influenza,inj,Quad PF,6+ Mos 12/08/2014, 12/09/2017   PFIZER(Purple Top)SARS-COV-2 Vaccination 10/26/2019, 11/16/2019     Past Medical History:  Diagnosis Date   Allergic rhinitis    Asthma without status asthmaticus    COVID-19 virus infection    a. 11/2019; b. 06/2020.   Diabetes mellitus without complication (HCC) 3220,2542   gestational   GERD (gastroesophageal reflux disease)    Hyperlipidemia    IGT (impaired glucose tolerance)    Morbid obesity (HCC)    Obesity    Osteopenia    Sleep apnea    Vertigo    Vitamin D deficiency     Tobacco History: Social History   Tobacco Use  Smoking Status Former   Current packs/day: 0.00   Average packs/day: 1 pack/day for 8.0 years (8.0 ttl pk-yrs)   Types: Cigarettes   Start date: 82   Quit date: 2000   Years since quitting: 25.3  Smokeless Tobacco Never  Tobacco Comments   quit smoking in 2000   Counseling given: Not Answered Tobacco comments: quit smoking in 2000   Continue to not smoke  Outpatient Encounter Medications as of 06/26/2023  Medication Sig   albuterol  (PROVENTIL ) (2.5 MG/3ML) 0.083% nebulizer solution Take 3 mLs (2.5 mg total) by nebulization every 6 (six) hours as needed for wheezing or shortness of breath.   albuterol  (VENTOLIN  HFA) 108 (90 Base) MCG/ACT inhaler Inhale 1-2 puffs into the lungs every 6 (six) hours as needed for wheezing or shortness of breath.   albuterol  (VENTOLIN  HFA) 108 (90 Base) MCG/ACT inhaler Inhale 2 puffs into the lungs every 6 (six) hours as needed for wheezing or shortness of breath.   azithromycin  (ZITHROMAX  Z-PAK) 250 MG tablet Take 2 tablets on day 1 then 1 tablet daily   cetirizine (ZYRTEC) 10 MG tablet Take 10 mg by mouth daily.   diphenhydramine -acetaminophen  (TYLENOL  PM) 25-500 MG TABS tablet Take 1 tablet by mouth at bedtime.   fluticasone  furoate-vilanterol (BREO ELLIPTA ) 100-25 MCG/ACT AEPB Inhale 1 puff into the lungs daily.   guaiFENesin -codeine  100-10 MG/5ML syrup Take 5 mLs by mouth every 6 (six) hours as needed for cough.   omeprazole (PRILOSEC) 20 MG capsule Take 20 mg by mouth  every evening.    Omeprazole Magnesium (PRILOSEC PO)    predniSONE  (DELTASONE ) 10 MG tablet Take 1 tablet (10 mg total) by mouth daily with breakfast.   simvastatin (ZOCOR) 5 MG tablet Take 5 mg by mouth daily.   triamcinolone (NASACORT) 55 MCG/ACT AERO nasal inhaler Place 2 sprays into the nose daily.   zolpidem  (AMBIEN ) 5 MG tablet Take 1 tablet (5 mg total) by mouth at bedtime as needed for sleep.   No facility-administered encounter medications on file as of 06/26/2023.     LMP 07/18/2000 (Approximate)    BP 136/78 (BP Location: Right Arm, Patient Position: Sitting)   Pulse 93   Temp 98.5 F (36.9 C) (Oral)   Ht 5' 7.5" (1.715 m)   Wt (!) 313 lb (142 kg)  LMP 07/18/2000 (Approximate)   SpO2 95%   BMI 48.30 kg/m      Review of Systems: Gen:  Denies  fever, sweats, chills weight loss  HEENT: Denies blurred vision, double vision, ear pain, eye pain, hearing loss, nose bleeds, sore throat Cardiac:  No dizziness, chest pain or heaviness, chest tightness,edema, No JVD Resp:   No cough, -sputum production, -shortness of breath,-wheezing, -hemoptysis,  Other:  All other systems negative   Physical Examination:   General Appearance: No distress  EYES PERRLA, EOM intact.   NECK Supple, No JVD Pulmonary: normal breath sounds, No wheezing.  CardiovascularNormal S1,S2.  No m/r/g.   Abdomen: Benign, Soft, non-tender. Neurology UE/LE 5/5 strength, no focal deficits Ext pulses intact, cap refill intact ALL OTHER ROS ARE NEGATIVE    Assessment & Plan:   63 year old pleasant white female seen today for follow-up intermittent asthma reactive airways disease due to morbid obesity with underlying sleep apnea in the setting of obesity and deconditioned state   Mild intermittent asthma No exacerbation at this time No evidence of heart failure at this time No evidence or signs of infection at this time No respiratory distress No fevers, chills, nausea, vomiting, diarrhea No  evidence of lower extremity edema No evidence hemoptysis Avoid Allergens and Irritants Avoid secondhand smoke Avoid SICK contacts Recommend  Masking  when appropriate Recommend Keep up-to-date with vaccinations Continue Breo and albuterol  as needed    Obesity -recommend significant weight loss -recommend changing diet  Deconditioned state -Recommend increased daily activity and exercise   Assessment of OSA Previous AHI  Continue CPAP as prescribed  Excellent compliance report Reviewed compliance report in detail with patient Patient definitely benefits the use of CPAP therapy as prescribed Using CPAP nightly and with naps Pressure setting is comfortable and is sleeping well. CPAP prescription 7 AHI reduced to 0.3  No evidence of acute heart failure at this time No respiratory distress No fevers, chills, nausea, vomiting, diarrhea No evidence hemoptysis  Patient Instructions Continue to use CPAP every night, minimum of 4-6 hours a night.  Change equipment every 30 days or as directed by DME.  Wash your tubing with warm soap and water daily, hang to dry. Wash humidifier portion weekly. Use bottled, distilled water and change daily   Be aware of reduced alertness and do not drive or operate heavy machinery if experiencing this or drowsiness.  Exercise encouraged, as tolerated. Encouraged proper weight management.  Important to get eight or more hours of sleep  Limiting the use of the computer and television before bedtime.  Decrease naps during the day, so night time sleep will become enhanced.  Limit caffeine, and sleep deprivation.  HTN, stroke, uncontrolled diabetes and heart failure are potential risk factors.  Risk of untreated sleep apnea including cardiac arrhthymias, stroke, DM, pulm HTN.    GERD Continue PPI      RE-ABNORMAL CT CHEST Independently Reviewed By Me  March 2023  new 7 mm ground-glass nodule in the left lower lobe. Repeat CT chest November  2023 showed resolution of left lower lobe nodule   MEDICATION ADJUSTMENTS/LABS AND TESTS ORDERED: Continue Breo as prescribed Recommend rinsing mouth out after every use Use albuterol  as needed Continue CPAP as prescribed Recommend weight loss  CURRENT MEDICATIONS REVIEWED AT LENGTH WITH PATIENT TODAY   Patient  satisfied with Plan of action and management. All questions answered   Follow up 1 year   I spent a total of 46 minutes reviewing chart data, face-to-face evaluation  with the patient, counseling and coordination of care as detailed above.      Lady Pier, M.D.  [facility]  Medical Director [facility] Medical Director [facility] Cardio-Pulmonary Department

## 2023-06-27 ENCOUNTER — Other Ambulatory Visit: Payer: Self-pay

## 2023-06-27 DIAGNOSIS — G4733 Obstructive sleep apnea (adult) (pediatric): Secondary | ICD-10-CM

## 2023-06-27 DIAGNOSIS — J4530 Mild persistent asthma, uncomplicated: Secondary | ICD-10-CM

## 2023-06-27 DIAGNOSIS — F5101 Primary insomnia: Secondary | ICD-10-CM

## 2023-06-27 DIAGNOSIS — J4520 Mild intermittent asthma, uncomplicated: Secondary | ICD-10-CM

## 2023-06-27 MED ORDER — MELATONIN 10 MG PO CAPS: 10.0000 mg | ORAL_CAPSULE | Freq: Every evening | ORAL | 11 refills | Status: AC

## 2023-06-27 NOTE — Telephone Encounter (Signed)
 Copied from CRM [id number]. Topic: General - Other >> Jun 27, 2023 12:51 PM Eveleen Hinds B wrote: Reason for CRM: Patient saw Dr Auston Left yesterday and he agreed to fill her script and also to add melatonin. Patient states it's not on her summary and the pharmacy don't have it. Please call.

## 2023-07-09 ENCOUNTER — Telehealth: Payer: Self-pay

## 2023-07-09 ENCOUNTER — Other Ambulatory Visit: Payer: Self-pay | Admitting: Internal Medicine

## 2023-07-09 DIAGNOSIS — F5101 Primary insomnia: Secondary | ICD-10-CM

## 2023-07-09 MED ORDER — ZOLPIDEM TARTRATE 5 MG PO TABS: 5.0000 mg | ORAL_TABLET | Freq: Every evening | ORAL | 0 refills | Status: DC | PRN

## 2023-07-09 NOTE — Telephone Encounter (Signed)
 Copied from CRM [id number]. Topic: General - Other >> Jul 09, 2023  3:47 PM Margarette Shawl wrote: Pt is contacting clinic regarding the refill request for Ambien . She has been attempting to get medication refilled for 2 weeks. Reviewed chart and it appears the prescription is waiting to be signed. Pt would like refill called into [facility] on [address]. She only has a couple of days remaining of medication.   CB#  [phone]

## 2023-07-09 NOTE — Progress Notes (Signed)
 Refilled as requested

## 2023-07-09 NOTE — Telephone Encounter (Signed)
 Pt called requesting refill and refill has been submitted via Dr. Auston Left.

## 2023-08-19 ENCOUNTER — Other Ambulatory Visit: Payer: Self-pay | Admitting: Internal Medicine

## 2023-08-19 DIAGNOSIS — F5101 Primary insomnia: Secondary | ICD-10-CM

## 2023-09-21 ENCOUNTER — Other Ambulatory Visit: Payer: Self-pay | Admitting: Internal Medicine

## 2023-09-21 DIAGNOSIS — F5101 Primary insomnia: Secondary | ICD-10-CM

## 2023-09-22 NOTE — Telephone Encounter (Signed)
 PDMP reviewed. Last OV May 2025. Will need f/u in 3 months for 6 month visit while on controlled substance. Rx refilled today. Thanks.

## 2023-10-28 DIAGNOSIS — Z0000 Encounter for general adult medical examination without abnormal findings: Secondary | ICD-10-CM | POA: Diagnosis not present

## 2023-10-28 DIAGNOSIS — E785 Hyperlipidemia, unspecified: Secondary | ICD-10-CM | POA: Diagnosis not present

## 2023-10-28 DIAGNOSIS — Z6841 Body Mass Index (BMI) 40.0 and over, adult: Secondary | ICD-10-CM | POA: Diagnosis not present

## 2023-10-28 DIAGNOSIS — L659 Nonscarring hair loss, unspecified: Secondary | ICD-10-CM | POA: Diagnosis not present

## 2023-10-28 DIAGNOSIS — F5101 Primary insomnia: Secondary | ICD-10-CM | POA: Diagnosis not present

## 2023-10-28 DIAGNOSIS — E1142 Type 2 diabetes mellitus with diabetic polyneuropathy: Secondary | ICD-10-CM | POA: Diagnosis not present

## 2023-10-28 DIAGNOSIS — Z1331 Encounter for screening for depression: Secondary | ICD-10-CM | POA: Diagnosis not present

## 2023-10-28 DIAGNOSIS — E1169 Type 2 diabetes mellitus with other specified complication: Secondary | ICD-10-CM | POA: Diagnosis not present

## 2023-10-28 DIAGNOSIS — K219 Gastro-esophageal reflux disease without esophagitis: Secondary | ICD-10-CM | POA: Diagnosis not present

## 2023-10-28 DIAGNOSIS — Z79899 Other long term (current) drug therapy: Secondary | ICD-10-CM | POA: Diagnosis not present

## 2023-10-28 DIAGNOSIS — E66813 Obesity, class 3: Secondary | ICD-10-CM | POA: Diagnosis not present

## 2023-10-28 DIAGNOSIS — Z1159 Encounter for screening for other viral diseases: Secondary | ICD-10-CM | POA: Diagnosis not present

## 2023-11-17 ENCOUNTER — Telehealth: Payer: Self-pay

## 2023-11-17 DIAGNOSIS — G4733 Obstructive sleep apnea (adult) (pediatric): Secondary | ICD-10-CM

## 2023-11-17 NOTE — Telephone Encounter (Signed)
 Order placed for new CPAP machine.  Left message for patient to call back.

## 2023-11-17 NOTE — Telephone Encounter (Signed)
 I have notified the patient. Nothing further needed.

## 2023-11-17 NOTE — Telephone Encounter (Signed)
 Copied from CRM #[id number]. Topic: Clinical - Order For Equipment >> Nov 17, 2023  9:28 AM Russell PARAS wrote: Reason for CRM:   Pt is needing new order for CPAP machine, due to machine reaching end of life. Has been instructed by DME company she will need a new signed order, along with note stating pt is using CPAP and benefiting use of machine and include pressure readings.   FX#  [phone]

## 2023-11-19 ENCOUNTER — Telehealth: Payer: Self-pay | Admitting: Internal Medicine

## 2023-11-19 NOTE — Telephone Encounter (Signed)
 I called and spoke with Kimberly Vasquez letting her know that the cpap order hadn't been sent anywhere yet because I needed a copy of her sleep study. She stated the sleep study was done by [facility] and that is where she got her machine from. I ask if it was ok to send the replacement cpap order to them since she was already established. Kimberly Vasquez stated that was ok to send to [facility] [address]

## 2023-11-19 NOTE — Telephone Encounter (Signed)
 Copied from CRM #[id number]. Topic: Clinical - Order For Equipment >> Nov 17, 2023  9:28 AM Russell PARAS wrote: Reason for CRM:   Pt is needing new order for CPAP machine, due to machine reaching end of life. Has been instructed by DME company she will need a new signed order, along with note stating pt is using CPAP and benefiting use of machine and include pressure readings.   FX#  [phone] >> Nov 19, 2023 10:45 AM Leila BROCKS wrote: Patient [phone] states called on Monday about cpap machine systems issues- cpap machine is dying and the monitor has exceed it's time and needs to be replaced. Patient spoke with [facility] [phone] today and they have not received a letter/order from the Dr. Jacqulyn office. Patient is concerned if cpap machine dies, she doesn't have anything. Please advise and call back.

## 2023-12-30 ENCOUNTER — Encounter: Payer: Self-pay | Admitting: Internal Medicine

## 2023-12-30 ENCOUNTER — Ambulatory Visit (INDEPENDENT_AMBULATORY_CARE_PROVIDER_SITE_OTHER): Payer: Self-pay | Admitting: Internal Medicine

## 2023-12-30 VITALS — BP 120/80 | HR 79 | Temp 98.4°F | Ht 67.5 in | Wt 314.0 lb

## 2023-12-30 DIAGNOSIS — R5381 Other malaise: Secondary | ICD-10-CM | POA: Diagnosis not present

## 2023-12-30 DIAGNOSIS — G4733 Obstructive sleep apnea (adult) (pediatric): Secondary | ICD-10-CM | POA: Diagnosis not present

## 2023-12-30 DIAGNOSIS — J4520 Mild intermittent asthma, uncomplicated: Secondary | ICD-10-CM | POA: Diagnosis not present

## 2023-12-30 DIAGNOSIS — E669 Obesity, unspecified: Secondary | ICD-10-CM | POA: Diagnosis not present

## 2023-12-30 DIAGNOSIS — K219 Gastro-esophageal reflux disease without esophagitis: Secondary | ICD-10-CM

## 2023-12-30 DIAGNOSIS — F5104 Psychophysiologic insomnia: Secondary | ICD-10-CM

## 2023-12-30 MED ORDER — FLUTICASONE FUROATE-VILANTEROL 100-25 MCG/ACT IN AEPB: 1.0000 | INHALATION_SPRAY | Freq: Every day | RESPIRATORY_TRACT | 3 refills | Status: AC

## 2023-12-30 NOTE — Progress Notes (Signed)
 SYNOPSIS 63 year old female former smoker followed in our office for asthma, obstructive sleep apnea  PMH:.  Obesity, GERD Smoker/ Smoking History: Former smoker.  Quit 2000.  4-pack-year smoking Maintenance: Breo 100 CPAP compliance report 02/16/20-03/16/20: 30/30 day usage, 0.3 AHI. She wants us  to assume responsibility for OSA not ENT.  Covid positive 10/21 - Covid positive soon after 2nd vaccination,booster due in March. Received MAB 11/22/19, states 24 hours after infusion sense of taste and smell returned.     CC Assessment of OSA Assessment of ASTHMA Previous h/o lung nodule   HPI   Assessment of asthma No exacerbation at this time No evidence of heart failure at this time No evidence or signs of infection at this time No respiratory distress No fevers, chills, nausea, vomiting, diarrhea No evidence of lower extremity edema No evidence hemoptysis  Continue Breo 100 maintenance therapy On Albuterol  and uses infrequently Mild intermittent asthma controlled We have tried to wean off Breo in the past however her symptoms returned in several days Therefore we will continue Breo as prescribed-refills submitted   Assessment of OSA Discussed sleep data and reviewed with patient.  Encouraged proper weight management.  Discussed driving precautions and its relationship with hypersomnolence.  Discussed sleep hygiene, and benefits of a fixed sleep waked time.  The importance of getting eight or more hours of sleep discussed with patient.  Discussed limiting the use of the computer and television before bedtime.  Decrease naps during the day, so night time sleep will become enhanced.  Limit caffeine, and sleep deprivation.   Patient uses and benefits from therapy Using CPAP nightly and with naps Pressure setting is comfortable and is sleeping well. CPAP 7 AHI reduced to 0.4 Excellent compliance report Insurance company is battling patient for machine patient states  that insurance company recommends in lab sleep study will therefore order an in-lab split-night sleep study   GERD under Control    RE-ABNORMAL CT CHEST Very minuscule LLL nodule on CT chest March 2023 Repeat CT chest November 2023 showed resolution of left lower lobe nodule    PFT:    Latest Ref Rng & Units 08/15/2014    1:48 PM  PFT Results  FVC-Pre L 2.82  P  FVC-Predicted Pre % 73  P  FVC-Post L 2.87  P  FVC-Predicted Post % 74  P  Pre FEV1/FVC % % 77  P  Post FEV1/FCV % % 78  P  FEV1-Pre L 2.17  P  FEV1-Predicted Pre % 72  P  FEV1-Post L 2.22  P  DLCO uncorrected ml/min/mmHg 33.37  P  DLCO UNC% % 115  P  DLVA Predicted % 108  P    P Preliminary result    WALK:    Specialty Problems       Pulmonary Problems   Extrinsic asthma   Pulmonary function testing 08/15/2014 FVC 73% FEV1 72% FEV1/FVC 77% FRC 41% RV 51% TLC 65% RV/TLC 76% Impression: Moderate obstruction with no stomach and broncho-dilator response. Severe to moderate decrease in RV and TLC's, suggesting a restrictive component, severe reduction in ERV, these findings together can be seen in abdominal/chest wall obesity. Clinical correlation is considered/advised. 6 minute walk test: 1131 feet/345 m, low saturation 93%, highest heart rate 107.      URI (upper respiratory infection)   Snoring   Asthma without status asthmaticus   OSA (obstructive sleep apnea)   Shortness of breath   Sinusitis   Mild intermittent asthma with acute exacerbation  Allergies  Allergen Reactions   Codeine  Nausea And Vomiting    Can't take it in pill form  Other Reaction(s): nausea (pill form)    Immunization History  Administered Date(s) Administered   Influenza,inj,Quad PF,6+ Mos 12/08/2014, 12/09/2017   PFIZER(Purple Top)SARS-COV-2 Vaccination 10/26/2019, 11/16/2019    Past Medical History:  Diagnosis Date   Allergic rhinitis    Asthma without status asthmaticus    COVID-19 virus infection    a.  11/2019; b. 06/2020.   Diabetes mellitus without complication (HCC) 8015,8008   gestational   GERD (gastroesophageal reflux disease)    Hyperlipidemia    IGT (impaired glucose tolerance)    Morbid obesity (HCC)    Obesity    Osteopenia    Sleep apnea    Vertigo    Vitamin D deficiency     Tobacco History: Social History   Tobacco Use  Smoking Status Former   Current packs/day: 0.00   Average packs/day: 1 pack/day for 8.0 years (8.0 ttl pk-yrs)   Types: Cigarettes   Start date: 52   Quit date: 2000   Years since quitting: 25.8  Smokeless Tobacco Never  Tobacco Comments   quit smoking in 2000   Counseling given: Not Answered Tobacco comments: quit smoking in 2000   Continue to not smoke  Outpatient Encounter Medications as of 12/30/2023  Medication Sig   gabapentin (NEURONTIN) 300 MG capsule Take 300 mg by mouth.   metFORMIN (GLUCOPHAGE) 500 MG tablet Take 500 mg by mouth 2 (two) times daily with a meal.   albuterol  (PROVENTIL ) (2.5 MG/3ML) 0.083% nebulizer solution Take 3 mLs (2.5 mg total) by nebulization every 6 (six) hours as needed for wheezing or shortness of breath.   albuterol  (VENTOLIN  HFA) 108 (90 Base) MCG/ACT inhaler Inhale 1-2 puffs into the lungs every 6 (six) hours as needed for wheezing or shortness of breath.   albuterol  (VENTOLIN  HFA) 108 (90 Base) MCG/ACT inhaler Inhale 2 puffs into the lungs every 6 (six) hours as needed for wheezing or shortness of breath.   azithromycin  (ZITHROMAX  Z-PAK) 250 MG tablet Take 2 tablets on day 1 then 1 tablet daily   B Complex Vitamins (VITAMIN B COMPLEX PO) Take 1 tablet by mouth daily.   Biotin 10 MG CAPS Take 1 capsule by mouth daily.   cetirizine (ZYRTEC) 10 MG tablet Take 10 mg by mouth daily.   diphenhydramine -acetaminophen  (TYLENOL  PM) 25-500 MG TABS tablet Take 1 tablet by mouth at bedtime.   fluticasone  furoate-vilanterol (BREO ELLIPTA ) 100-25 MCG/ACT AEPB Inhale 1 puff into the lungs daily.    guaiFENesin -codeine  100-10 MG/5ML syrup Take 5 mLs by mouth every 6 (six) hours as needed for cough.   Magnesium Glycinate 100 MG CAPS Take 400 mg by mouth.   Melatonin 10 MG CAPS Take 10 mg by mouth at bedtime.   omeprazole (PRILOSEC) 20 MG capsule Take 20 mg by mouth every evening.    Omeprazole Magnesium (PRILOSEC PO)    predniSONE  (DELTASONE ) 10 MG tablet Take 1 tablet (10 mg total) by mouth daily with breakfast.   rosuvastatin (CRESTOR) 5 MG tablet Take 5 mg by mouth daily.   simvastatin (ZOCOR) 5 MG tablet Take 5 mg by mouth daily.   triamcinolone (NASACORT) 55 MCG/ACT AERO nasal inhaler Place 2 sprays into the nose daily.   Vitamin D-Vitamin K (VITAMIN K2-VITAMIN D3 PO) K2 (90mcg) + D3 (125 mcg) 2 capsule by mouth once daily   [DISCONTINUED] zolpidem  (AMBIEN ) 5 MG tablet TAKE 1 TABLET BY MOUTH  AT BEDTIME AS NEEDED FOR SLEEP   No facility-administered encounter medications on file as of 12/30/2023.   BP 120/80   Pulse 79   Temp 98.4 F (36.9 C)   Ht 5' 7.5 (1.715 m)   Wt (!) 314 lb (142.4 kg)   LMP 07/18/2000 (Approximate)   SpO2 95%   BMI 48.45 kg/m      Physical Examination:  General Appearance: No distress  EYES EOM intact.   NECK Supple, No JVD Pulmonary: normal breath sounds, No wheezing.  CardiovascularNormal S1,S2.  No m/r/g.   Ext pulses intact, cap refill intact  ALL OTHER ROS ARE NEGATIVE   Assessment & Plan:   63 year-old pleasant white female seen today for follow-up intermittent asthma reactive airways disease due to morbid obesity with underlying sleep apnea in the setting of obesity and deconditioned state   Mild intermittent asthma No exacerbation at this time No evidence of heart failure at this time No evidence or signs of infection at this time No respiratory distress No fevers, chills, nausea, vomiting, diarrhea No evidence of lower extremity edema No evidence hemoptysis Avoid Allergens and Irritants Avoid secondhand smoke Avoid SICK  contacts Recommend  Masking  when appropriate Recommend Keep up-to-date with vaccinations Continue Breo  Rinse mouth after use   Obesity -recommend significant weight loss -recommend changing diet  Deconditioned state -Recommend increased daily activity and exercise   Assessment of OSA Previous AHI 10 Continue CPAP as prescribed  Excellent compliance report Reviewed compliance report in detail with patient Patient definitely benefits the use of CPAP therapy as prescribed Using CPAP nightly and with naps Pressure setting is comfortable and is sleeping well. CPAP prescription 7 AHI reduced to 0.4  No evidence of acute heart failure at this time No respiratory distress No fevers, chills, nausea, vomiting, diarrhea No evidence hemoptysis  Patient Instructions Continue to use CPAP every night, minimum of 4-6 hours a night.  Change equipment every 30 days or as directed by DME.  Wash your tubing with warm soap and water daily, hang to dry.  Wash humidifier portion weekly. Use bottled, distilled water and change daily  Risk of untreated sleep apnea including cardiac arrhthymias, stroke, DM, pulm HTN.    GERD Continue PPI    RE-ABNORMAL CT CHEST March 2023  new 7 mm ground-glass nodule in the left lower lobe. Repeat CT chest November 2023 showed resolution of left lower lobe nodule   MEDICATION ADJUSTMENTS/LABS AND TESTS ORDERED: Will plan for in-lab split-night sleep study Continue Breo as prescribed Asthma under control rinse mouth after use Continue CPAP as prescribed excellent compliance Highly recommend weight loss-patient to be evaluated for gastric bypass   CURRENT MEDICATIONS REVIEWED AT LENGTH WITH PATIENT TODAY   Patient  satisfied with Plan of action and management. All questions answered   Follow up 1 year   I spent a total of 45 minutes dedicated to the care of this patient on the date of this encounter to include pre-visit review of records,  face-to-face time with the patient discussing conditions above, post visit ordering of testing, clinical documentation with the electronic health record, making appropriate referrals as documented, and communicating necessary information to the patient's healthcare team.    The Patient requires high complexity decision making for assessment and support, frequent evaluation and titration of therapies, application of advanced monitoring technologies and extensive interpretation of multiple databases.  Patient satisfied with Plan of action and management. All questions answered    Nickolas Alm Cellar, M.D.  [facility]  Medical Director [facility] [place]   t

## 2023-12-30 NOTE — Patient Instructions (Addendum)
 Excellent Job A+ GOLD STAR!!  Continue CPAP as prescribed  Patient Instructions Continue to use CPAP every night, minimum of 4-6 hours a night.  Change equipment every 30 days or as directed by DME.  Wash your tubing with warm soap and water daily, hang to dry. Wash humidifier portion weekly. Use bottled, distilled water and change daily   Be aware of reduced alertness and do not drive or operate heavy machinery if experiencing this or drowsiness.  Exercise encouraged, as tolerated. Encouraged proper weight management.  Important to get eight or more hours of sleep  Limiting the use of the computer and television before bedtime.  Decrease naps during the day, so night time sleep will become enhanced.  Limit caffeine, and sleep deprivation.    Avoid Allergens and Irritants Avoid secondhand smoke Avoid SICK contacts Recommend  Masking  when appropriate Recommend Keep up-to-date with vaccinations  Continue inhalers as prescribed

## 2024-01-08 ENCOUNTER — Telehealth: Payer: Self-pay

## 2024-01-08 NOTE — Telephone Encounter (Signed)
 Copied from CRM #[id number]. Topic: Clinical - Medication Question >> Jan 08, 2024 12:26 PM Kimberly Vasquez wrote: Reason for CRM: pt is calling wanting to know how Dr. Isaiah would for her to taken off of her fluticasone  furoate-vilanterol (BREO ELLIPTA ) 100-25 MCG/ACT AEPB meds.

## 2024-01-27 ENCOUNTER — Ambulatory Visit: Admitting: Internal Medicine

## 2024-01-27 ENCOUNTER — Encounter: Payer: Self-pay | Admitting: Internal Medicine

## 2024-01-27 VITALS — BP 130/80 | HR 84 | Temp 98.3°F | Ht 67.5 in | Wt 313.2 lb

## 2024-01-27 DIAGNOSIS — J4540 Moderate persistent asthma, uncomplicated: Secondary | ICD-10-CM | POA: Diagnosis not present

## 2024-01-27 DIAGNOSIS — G4733 Obstructive sleep apnea (adult) (pediatric): Secondary | ICD-10-CM | POA: Diagnosis not present

## 2024-01-27 NOTE — Progress Notes (Unsigned)
 SYNOPSIS 63 year old female former smoker followed in our office for asthma, obstructive sleep apnea  PMH:.  Obesity, GERD Smoker/ Smoking History: Former smoker.  Quit 2000.  4-pack-year smoking Maintenance: Breo 100 CPAP compliance report 02/16/20-03/16/20: 30/30 day usage, 0.3 AHI. She wants us  to assume responsibility for OSA not ENT.  Covid positive 10/21 - Covid positive soon after 2nd vaccination,booster due in March. Received MAB 11/22/19, states 24 hours after infusion sense of taste and smell returned.     CC Assessment of OSA Assessment of asthma Assessment of lung nodule  HPI   Assessment of asthma No exacerbation at this time No evidence of heart failure at this time No evidence or signs of infection at this time No respiratory distress No fevers, chills, nausea, vomiting, diarrhea No evidence of lower extremity edema No evidence hemoptysis   Continue Breo 100 maintenance therapy On Albuterol  and uses infrequently Mild intermittent asthma controlled We have tried to wean off Breo in the past however her symptoms returned in several days Therefore we will continue Breo as prescribed-refills submitted   Assessment of OSA Discussed sleep data and reviewed with patient.  Encouraged proper weight management.  Discussed sleep hygiene Patient uses and benefits from therapy Using CPAP nightly and with naps Settings are comfortable and is sleeping well. CPAP 7 AHI reduced to 0.4 Excellent compliance report   GERD under Control    RE-ABNORMAL CT CHEST Very minuscule LLL nodule on CT chest March 2023 Repeat CT chest November 2023 showed resolution of left lower lobe nodule    PFT:    Latest Ref Rng & Units 08/15/2014    1:48 PM  PFT Results  FVC-Pre L 2.82  P  FVC-Predicted Pre % 73  P  FVC-Post L 2.87  P  FVC-Predicted Post % 74  P  Pre FEV1/FVC % % 77  P  Post FEV1/FCV % % 78  P  FEV1-Pre L 2.17  P  FEV1-Predicted Pre % 72  P  FEV1-Post L  2.22  P  DLCO uncorrected ml/min/mmHg 33.37  P  DLCO UNC% % 115  P  DLVA Predicted % 108  P    P Preliminary result    WALK:    Specialty Problems       Pulmonary Problems   Extrinsic asthma   Pulmonary function testing 08/15/2014 FVC 73% FEV1 72% FEV1/FVC 77% FRC 41% RV 51% TLC 65% RV/TLC 76% Impression: Moderate obstruction with no stomach and broncho-dilator response. Severe to moderate decrease in RV and TLC's, suggesting a restrictive component, severe reduction in ERV, these findings together can be seen in abdominal/chest wall obesity. Clinical correlation is considered/advised. 6 minute walk test: 1131 feet/345 m, low saturation 93%, highest heart rate 107.      URI (upper respiratory infection)   Snoring   Asthma without status asthmaticus   OSA (obstructive sleep apnea)   Shortness of breath   Sinusitis   Mild intermittent asthma with acute exacerbation    Allergies  Allergen Reactions   Codeine  Nausea And Vomiting and Nausea Only    Can't take it in pill form  Other Reaction(s): nausea (pill form)    Immunization History  Administered Date(s) Administered   Influenza,inj,Quad PF,6+ Mos 12/08/2014, 12/09/2017   PFIZER(Purple Top)SARS-COV-2 Vaccination 10/26/2019, 11/16/2019    Past Medical History:  Diagnosis Date   Allergic rhinitis    Asthma without status asthmaticus    COVID-19 virus infection    a. 11/2019; b. 06/2020.   Diabetes  mellitus without complication (HCC) 8015,8008   gestational   GERD (gastroesophageal reflux disease)    Hyperlipidemia    IGT (impaired glucose tolerance)    Morbid obesity (HCC)    Obesity    Osteopenia    Sleep apnea    Vertigo    Vitamin D deficiency     Tobacco History: Social History   Tobacco Use  Smoking Status Former   Current packs/day: 0.00   Average packs/day: 1 pack/day for 8.0 years (8.0 ttl pk-yrs)   Types: Cigarettes   Start date: 50   Quit date: 2000   Years since quitting: 25.9   Smokeless Tobacco Never  Tobacco Comments   quit smoking in 2000   Counseling given: Not Answered Tobacco comments: quit smoking in 2000   Continue to not smoke  Outpatient Encounter Medications as of 01/27/2024  Medication Sig   CAPVAXIVE 0.5 ML injection Inject 0.5 mLs into the muscle once.   FLUZONE HIGH-DOSE 0.5 ML injection Inject 0.5 mLs into the muscle once.   SHINGRIX injection Inject 0.5 mLs into the muscle once.   albuterol  (PROVENTIL ) (2.5 MG/3ML) 0.083% nebulizer solution Take 3 mLs (2.5 mg total) by nebulization every 6 (six) hours as needed for wheezing or shortness of breath.   albuterol  (VENTOLIN  HFA) 108 (90 Base) MCG/ACT inhaler Inhale 1-2 puffs into the lungs every 6 (six) hours as needed for wheezing or shortness of breath.   albuterol  (VENTOLIN  HFA) 108 (90 Base) MCG/ACT inhaler Inhale 2 puffs into the lungs every 6 (six) hours as needed for wheezing or shortness of breath.   azithromycin  (ZITHROMAX  Z-PAK) 250 MG tablet Take 2 tablets on day 1 then 1 tablet daily   B Complex Vitamins (VITAMIN B COMPLEX PO) Take 1 tablet by mouth daily.   Biotin 10 MG CAPS Take 1 capsule by mouth daily.   cetirizine (ZYRTEC) 10 MG tablet Take 10 mg by mouth daily.   diphenhydramine -acetaminophen  (TYLENOL  PM) 25-500 MG TABS tablet Take 1 tablet by mouth at bedtime.   fluticasone  furoate-vilanterol (BREO ELLIPTA ) 100-25 MCG/ACT AEPB Inhale 1 puff into the lungs daily.   gabapentin (NEURONTIN) 300 MG capsule Take 300 mg by mouth.   guaiFENesin -codeine  100-10 MG/5ML syrup Take 5 mLs by mouth every 6 (six) hours as needed for cough. (Patient not taking: Reported on 12/30/2023)   Magnesium (MAGNESIUM COMPLEX) 300 MG TABS Take 1 tablet by mouth at bedtime.   Melatonin 10 MG CAPS Take 10 mg by mouth at bedtime. (Patient not taking: Reported on 12/30/2023)   metFORMIN (GLUCOPHAGE) 500 MG tablet Take 500 mg by mouth 2 (two) times daily with a meal.   omeprazole (PRILOSEC) 20 MG capsule Take 20  mg by mouth every evening.    Omeprazole Magnesium (PRILOSEC PO)    predniSONE  (DELTASONE ) 10 MG tablet Take 1 tablet (10 mg total) by mouth daily with breakfast.   rosuvastatin (CRESTOR) 5 MG tablet Take 5 mg by mouth daily.   simvastatin (ZOCOR) 5 MG tablet Take 5 mg by mouth daily.   triamcinolone (NASACORT) 55 MCG/ACT AERO nasal inhaler Place 2 sprays into the nose daily.   Vitamin D-Vitamin K (VITAMIN K2-VITAMIN D3 PO) K2 (90mcg) + D3 (125 mcg) 2 capsule by mouth once daily   No facility-administered encounter medications on file as of 01/27/2024.   LMP 07/18/2000 (Approximate)   BP 130/80   Pulse 84   Temp 98.3 F (36.8 C)   Ht 5' 7.5 (1.715 m)   Wt (!) 313 lb  3.2 oz (142.1 kg)   LMP 07/18/2000 (Approximate)   SpO2 94%   BMI 48.33 kg/m     Physical Examination:  General Appearance: No distress  EYES EOM intact.   NECK Supple, No JVD Pulmonary: normal breath sounds, No wheezing.  CardiovascularNormal S1,S2.  No m/r/g.   Ext pulses intact, cap refill intact  ALL OTHER ROS ARE NEGATIVE   Assessment & Plan:   63 year-old pleasant white female seen today for follow-up intermittent asthma reactive airways disease due to morbid obesity with underlying sleep apnea in the setting of obesity and deconditioned state   Mild intermittent asthma Continue Breo  Rinse mouth after use No indication for antibiotics or prednisone  at this time  Assessment of OSA Previous AHI 10  Continue CPAP as prescribed  Excellent compliance report Reviewed compliance report in detail with patient Patient definitely benefits the use of CPAP therapy as prescribed Using CPAP nightly and with naps Pressure setting is comfortable and is sleeping well. CPAP prescription 7 AHI reduced to 0.4  No evidence of acute heart failure at this time No respiratory distress No fevers, chills, nausea, vomiting, diarrhea No evidence hemoptysis  Patient Instructions Continue to use CPAP every night,  minimum of 4-6 hours a night.  Change equipment every 30 days or as directed by DME.  Wash your tubing with warm soap and water daily, hang to dry.  Wash humidifier portion weekly. Use bottled, distilled water and change daily  Risk of untreated sleep apnea including cardiac arrhthymias, stroke, DM, pulm HTN.     GERD Continue PPI    RE-ABNORMAL CT CHEST March 2023  new 7 mm ground-glass nodule in the left lower lobe. Repeat CT chest November 2023 showed resolution of left lower lobe nodule   MEDICATION ADJUSTMENTS/LABS AND TESTS ORDERED: In lab sleep study pending Continue Breo as prescribed Asthma under control rinse mouth after use Continue CPAP as prescribed excellent compliance Highly recommend weight loss-patient to be evaluated for gastric bypass    CURRENT MEDICATIONS REVIEWED AT LENGTH WITH PATIENT TODAY   Patient  satisfied with Plan of action and management. All questions answered   Follow up 1 year   I spent a total of 41 minutes dedicated to the care of this patient on the date of this encounter to include pre-visit review of records, face-to-face time with the patient discussing conditions above, post visit ordering of testing, clinical documentation with the electronic health record, making appropriate referrals as documented, and communicating necessary information to the patient's healthcare team.    The Patient requires high complexity decision making for assessment and support, frequent evaluation and titration of therapies, application of advanced monitoring technologies and extensive interpretation of multiple databases.  Patient satisfied with Plan of action and management. All questions answered    Nickolas Alm Cellar, M.D.  [facility]  Medical Director [facility] [place]

## 2024-01-27 NOTE — Patient Instructions (Signed)
 Excellent Job A+ GOLD STAR!!  Continue CPAP as prescribed  Patient Instructions Continue to use CPAP every night, minimum of 4-6 hours a night.  Change equipment every 30 days or as directed by DME.  Wash your tubing with warm soap and water daily, hang to dry. Wash humidifier portion weekly. Use bottled, distilled water and change daily   Be aware of reduced alertness and do not drive or operate heavy machinery if experiencing this or drowsiness.  Exercise encouraged, as tolerated. Encouraged proper weight management.  Important to get eight or more hours of sleep  Limiting the use of the computer and television before bedtime.  Decrease naps during the day, so night time sleep will become enhanced.  Limit caffeine, and sleep deprivation.    Avoid Allergens and Irritants Avoid secondhand smoke Avoid SICK contacts Recommend  Masking  when appropriate Recommend Keep up-to-date with vaccinations  Continue to use inhalers as prescribed

## 2024-01-29 ENCOUNTER — Other Ambulatory Visit: Payer: Self-pay | Admitting: Nurse Practitioner

## 2024-01-29 DIAGNOSIS — Z1231 Encounter for screening mammogram for malignant neoplasm of breast: Secondary | ICD-10-CM

## 2024-02-02 ENCOUNTER — Ambulatory Visit: Attending: Sleep Medicine

## 2024-02-02 DIAGNOSIS — E669 Obesity, unspecified: Secondary | ICD-10-CM | POA: Insufficient documentation

## 2024-02-02 DIAGNOSIS — E119 Type 2 diabetes mellitus without complications: Secondary | ICD-10-CM | POA: Diagnosis not present

## 2024-02-02 DIAGNOSIS — F5104 Psychophysiologic insomnia: Secondary | ICD-10-CM

## 2024-02-02 DIAGNOSIS — G4710 Hypersomnia, unspecified: Secondary | ICD-10-CM | POA: Diagnosis not present

## 2024-02-02 DIAGNOSIS — R0683 Snoring: Secondary | ICD-10-CM | POA: Diagnosis present

## 2024-02-02 DIAGNOSIS — G4733 Obstructive sleep apnea (adult) (pediatric): Secondary | ICD-10-CM

## 2024-02-10 ENCOUNTER — Telehealth: Payer: Self-pay

## 2024-02-10 NOTE — Telephone Encounter (Signed)
 Copied from CRM #[id number]. Topic: Clinical - Lab/Test Results >> Feb 10, 2024 12:19 PM Devaughn RAMAN wrote: Reason for CRM: Pt calling in regards to sleep study results, pt is trying to see if she qualifies for CPAP machine and she is trying to do it before the end of the year because her insurance changes on 02/19/24.

## 2024-02-20 DIAGNOSIS — G4733 Obstructive sleep apnea (adult) (pediatric): Secondary | ICD-10-CM | POA: Diagnosis not present

## 2024-02-24 NOTE — Telephone Encounter (Signed)
 Sleep study results have been scanned into her chart.

## 2024-02-26 NOTE — Telephone Encounter (Signed)
 Copied from CRM [id number]. Topic: Clinical - Lab/Test Results >> Feb 26, 2024  3:38 PM Kimberly Vasquez wrote: Reason for CRM: Patient would like to have a nurse or provider review her sleep study and advise her of next steps.

## 2024-02-26 NOTE — Telephone Encounter (Signed)
 Spoke with pt to notify her that we are waiting for Dr. Isaiah to review the study and tell us  what to tell her essentially, and once we know more we will follow up. Pt was understanding and will wait for us  to follow up and aware Dr. Isaiah is back in clinic on the 20th.

## 2024-03-01 ENCOUNTER — Ambulatory Visit (INDEPENDENT_AMBULATORY_CARE_PROVIDER_SITE_OTHER): Payer: Self-pay | Admitting: Sleep Medicine

## 2024-03-01 DIAGNOSIS — F5104 Psychophysiologic insomnia: Secondary | ICD-10-CM

## 2024-03-01 DIAGNOSIS — F5101 Primary insomnia: Secondary | ICD-10-CM

## 2024-03-01 DIAGNOSIS — G4733 Obstructive sleep apnea (adult) (pediatric): Secondary | ICD-10-CM

## 2024-03-01 NOTE — Addendum Note (Signed)
 Addended by: Farron Watrous J on: 03/01/2024 02:47 PM   Modules accepted: Orders

## 2024-03-10 NOTE — Telephone Encounter (Signed)
 Addressed in other encounter. NFN.

## 2024-03-11 ENCOUNTER — Encounter

## 2024-03-11 DIAGNOSIS — F5101 Primary insomnia: Secondary | ICD-10-CM

## 2024-03-11 DIAGNOSIS — F5104 Psychophysiologic insomnia: Secondary | ICD-10-CM

## 2024-03-11 DIAGNOSIS — G4733 Obstructive sleep apnea (adult) (pediatric): Secondary | ICD-10-CM
